# Patient Record
Sex: Female | Born: 1975 | Race: White | Hispanic: No | Marital: Single | Smoking: Never smoker
Health system: Southern US, Community
[De-identification: ages and names within clinical notes are randomized; demographics above are authoritative.]

## PROBLEM LIST (undated history)

## (undated) DIAGNOSIS — F4310 Post-traumatic stress disorder, unspecified: Secondary | ICD-10-CM

## (undated) DIAGNOSIS — G43909 Migraine, unspecified, not intractable, without status migrainosus: Secondary | ICD-10-CM

## (undated) DIAGNOSIS — F419 Anxiety disorder, unspecified: Secondary | ICD-10-CM

## (undated) DIAGNOSIS — J302 Other seasonal allergic rhinitis: Secondary | ICD-10-CM

## (undated) DIAGNOSIS — G4733 Obstructive sleep apnea (adult) (pediatric): Secondary | ICD-10-CM

## (undated) DIAGNOSIS — R011 Cardiac murmur, unspecified: Secondary | ICD-10-CM

## (undated) DIAGNOSIS — G4730 Sleep apnea, unspecified: Secondary | ICD-10-CM

## (undated) DIAGNOSIS — K219 Gastro-esophageal reflux disease without esophagitis: Secondary | ICD-10-CM

## (undated) DIAGNOSIS — H32 Chorioretinal disorders in diseases classified elsewhere: Secondary | ICD-10-CM

## (undated) DIAGNOSIS — N946 Dysmenorrhea, unspecified: Secondary | ICD-10-CM

## (undated) DIAGNOSIS — K589 Irritable bowel syndrome without diarrhea: Secondary | ICD-10-CM

## (undated) DIAGNOSIS — F329 Major depressive disorder, single episode, unspecified: Secondary | ICD-10-CM

## (undated) DIAGNOSIS — T7840XA Allergy, unspecified, initial encounter: Secondary | ICD-10-CM

## (undated) DIAGNOSIS — D649 Anemia, unspecified: Secondary | ICD-10-CM

## (undated) DIAGNOSIS — B399 Histoplasmosis, unspecified: Secondary | ICD-10-CM

## (undated) DIAGNOSIS — F32A Depression, unspecified: Secondary | ICD-10-CM

## (undated) DIAGNOSIS — Z8619 Personal history of other infectious and parasitic diseases: Secondary | ICD-10-CM

## (undated) DIAGNOSIS — K5909 Other constipation: Secondary | ICD-10-CM

## (undated) HISTORY — DX: Irritable bowel syndrome, unspecified: K58.9

## (undated) HISTORY — DX: Migraine, unspecified, not intractable, without status migrainosus: G43.909

## (undated) HISTORY — DX: Depression, unspecified: F32.A

## (undated) HISTORY — DX: Anxiety disorder, unspecified: F41.9

## (undated) HISTORY — DX: Other constipation: K59.09

## (undated) HISTORY — DX: Allergy, unspecified, initial encounter: T78.40XA

## (undated) HISTORY — DX: Personal history of other infectious and parasitic diseases: Z86.19

## (undated) HISTORY — DX: Major depressive disorder, single episode, unspecified: F32.9

## (undated) HISTORY — DX: Histoplasmosis, unspecified: H32

## (undated) HISTORY — DX: Histoplasmosis, unspecified: B39.9

## (undated) HISTORY — DX: Gastro-esophageal reflux disease without esophagitis: K21.9

## (undated) HISTORY — DX: Cardiac murmur, unspecified: R01.1

## (undated) HISTORY — PX: WISDOM TOOTH EXTRACTION: SHX21

## (undated) HISTORY — DX: Obstructive sleep apnea (adult) (pediatric): G47.33

## (undated) HISTORY — PX: COLONOSCOPY: SHX174

## (undated) HISTORY — DX: Anemia, unspecified: D64.9

## (undated) HISTORY — DX: Other seasonal allergic rhinitis: J30.2

## (undated) HISTORY — DX: Dysmenorrhea, unspecified: N94.6

## (undated) HISTORY — DX: Sleep apnea, unspecified: G47.30

## (undated) HISTORY — DX: Post-traumatic stress disorder, unspecified: F43.10

---

## 2007-10-20 ENCOUNTER — Ambulatory Visit: Payer: Self-pay | Admitting: Family Medicine

## 2007-10-20 DIAGNOSIS — F4310 Post-traumatic stress disorder, unspecified: Secondary | ICD-10-CM | POA: Insufficient documentation

## 2007-10-20 DIAGNOSIS — N6019 Diffuse cystic mastopathy of unspecified breast: Secondary | ICD-10-CM | POA: Insufficient documentation

## 2007-10-20 DIAGNOSIS — N946 Dysmenorrhea, unspecified: Secondary | ICD-10-CM | POA: Insufficient documentation

## 2007-11-28 ENCOUNTER — Ambulatory Visit: Payer: Self-pay | Admitting: Family Medicine

## 2008-04-02 ENCOUNTER — Ambulatory Visit: Payer: Self-pay | Admitting: Family Medicine

## 2008-04-09 ENCOUNTER — Ambulatory Visit: Payer: Self-pay | Admitting: Family Medicine

## 2008-04-09 LAB — CONVERTED CEMR LAB
AST: 30 units/L (ref 0–37)
Alkaline Phosphatase: 33 units/L — ABNORMAL LOW (ref 39–117)
Basophils Absolute: 0 10*3/uL (ref 0.0–0.1)
Bilirubin, Direct: 0.1 mg/dL (ref 0.0–0.3)
Chloride: 106 meq/L (ref 96–112)
Eosinophils Absolute: 0 10*3/uL (ref 0.0–0.7)
Eosinophils Relative: 1.4 % (ref 0.0–5.0)
GFR calc non Af Amer: 68 mL/min
HDL: 73.4 mg/dL (ref >39.0)
MCV: 86.8 fL (ref 78.0–100.0)
Neutrophils Relative %: 44.7 % (ref 43.0–77.0)
Platelets: 175 10*3/uL (ref 150–400)
Potassium: 4.5 meq/L (ref 3.5–5.1)
Sodium: 140 meq/L (ref 135–145)
Total Bilirubin: 0.6 mg/dL (ref 0.3–1.2)
VLDL: 12 mg/dL (ref 0–40)
WBC: 2.6 10*3/uL — ABNORMAL LOW (ref 4.5–10.5)

## 2008-04-10 LAB — CONVERTED CEMR LAB
Basophils Absolute: 0 10*3/uL (ref 0.0–0.1)
Basophils Relative: 0.6 % (ref 0.0–1.0)
Eosinophils Absolute: 0 10*3/uL (ref 0.0–0.7)
Eosinophils Relative: 1.1 % (ref 0.0–5.0)
Hemoglobin: 11.1 g/dL — ABNORMAL LOW (ref 12.0–15.0)
MCHC: 33.6 g/dL (ref 30.0–36.0)
MCV: 87.1 fL (ref 78.0–100.0)
Neutro Abs: 2.2 10*3/uL (ref 1.4–7.7)
RBC: 3.79 M/uL — ABNORMAL LOW (ref 3.87–5.11)

## 2008-05-11 ENCOUNTER — Ambulatory Visit: Payer: Self-pay | Admitting: Family Medicine

## 2008-05-11 DIAGNOSIS — D649 Anemia, unspecified: Secondary | ICD-10-CM | POA: Insufficient documentation

## 2008-05-15 LAB — CONVERTED CEMR LAB
Basophils Absolute: 0 10*3/uL (ref 0.0–0.1)
HCT: 35.5 % — ABNORMAL LOW (ref 36.0–46.0)
Lymphocytes Relative: 36.3 % (ref 12.0–46.0)
MCHC: 34.5 g/dL (ref 30.0–36.0)
Monocytes Absolute: 0.3 10*3/uL (ref 0.1–1.0)
Monocytes Relative: 7 % (ref 3.0–12.0)
Platelets: 169 10*3/uL (ref 150–400)
RDW: 12.9 % (ref 11.5–14.6)

## 2008-08-14 ENCOUNTER — Ambulatory Visit: Payer: Self-pay | Admitting: Family Medicine

## 2008-08-16 LAB — CONVERTED CEMR LAB
Basophils Relative: 0.1 % (ref 0.0–3.0)
Eosinophils Absolute: 0.1 10*3/uL (ref 0.0–0.7)
Eosinophils Relative: 1.4 % (ref 0.0–5.0)
MCV: 87.3 fL (ref 78.0–100.0)
Monocytes Relative: 6.1 % (ref 3.0–12.0)
Neutrophils Relative %: 61.3 % (ref 43.0–77.0)
RBC: 4.24 M/uL (ref 3.87–5.11)
WBC: 5.4 10*3/uL (ref 4.5–10.5)

## 2008-10-04 ENCOUNTER — Ambulatory Visit: Payer: Self-pay | Admitting: Family Medicine

## 2008-10-08 LAB — CONVERTED CEMR LAB
ALT: 28 units/L (ref 0–35)
Amylase: 102 units/L (ref 27–131)
Basophils Absolute: 0 10*3/uL (ref 0.0–0.1)
Basophils Relative: 0 % (ref 0.0–3.0)
Bilirubin, Direct: 0.1 mg/dL (ref 0.0–0.3)
Calcium: 8.4 mg/dL (ref 8.4–10.5)
Creatinine, Ser: 0.9 mg/dL (ref 0.4–1.2)
Eosinophils Absolute: 0 10*3/uL (ref 0.0–0.7)
GFR calc Af Amer: 93 mL/min
HCT: 39.1 % (ref 36.0–46.0)
Hemoglobin: 13.5 g/dL (ref 12.0–15.0)
Lipase: 25 units/L (ref 11.0–59.0)
MCHC: 34.6 g/dL (ref 30.0–36.0)
MCV: 89.7 fL (ref 78.0–100.0)
Monocytes Absolute: 0.2 10*3/uL (ref 0.1–1.0)
Neutro Abs: 5.5 10*3/uL (ref 1.4–7.7)
RBC: 4.36 M/uL (ref 3.87–5.11)
RDW: 12.5 % (ref 11.5–14.6)
Sodium: 136 meq/L (ref 135–145)
Total Bilirubin: 0.8 mg/dL (ref 0.3–1.2)

## 2008-10-11 ENCOUNTER — Ambulatory Visit: Payer: Self-pay | Admitting: Family Medicine

## 2008-10-11 LAB — CONVERTED CEMR LAB
CO2: 29 meq/L (ref 19–32)
Calcium: 8.9 mg/dL (ref 8.4–10.5)
Chloride: 103 meq/L (ref 96–112)
Creatinine, Ser: 0.9 mg/dL (ref 0.4–1.2)
GFR calc non Af Amer: 77 mL/min
Sodium: 137 meq/L (ref 135–145)

## 2008-10-31 ENCOUNTER — Ambulatory Visit: Payer: Self-pay | Admitting: Family Medicine

## 2008-11-12 ENCOUNTER — Encounter: Payer: Self-pay | Admitting: Family Medicine

## 2009-05-09 ENCOUNTER — Encounter: Payer: Self-pay | Admitting: Family Medicine

## 2009-05-14 ENCOUNTER — Encounter: Payer: Self-pay | Admitting: Family Medicine

## 2009-05-20 ENCOUNTER — Ambulatory Visit: Payer: Self-pay | Admitting: Family Medicine

## 2009-05-21 LAB — CONVERTED CEMR LAB
ALT: 14 units/L (ref 0–35)
AST: 19 units/L (ref 0–37)
Alkaline Phosphatase: 45 units/L (ref 39–117)
BUN: 10 mg/dL (ref 6–23)
Basophils Absolute: 0 10*3/uL (ref 0.0–0.1)
CO2: 28 meq/L (ref 19–32)
Calcium: 8.9 mg/dL (ref 8.4–10.5)
Creatinine, Ser: 0.8 mg/dL (ref 0.4–1.2)
Eosinophils Relative: 1.9 % (ref 0.0–5.0)
GFR calc non Af Amer: 87.41 mL/min (ref >60)
Glucose, Bld: 93 mg/dL (ref 70–99)
HCT: 36.4 % (ref 36.0–46.0)
Hemoglobin: 12.8 g/dL (ref 12.0–15.0)
Lymphocytes Relative: 21.4 % (ref 12.0–46.0)
Monocytes Relative: 9.3 % (ref 3.0–12.0)
Platelets: 161 10*3/uL (ref 150.0–400.0)
RDW: 12.3 % (ref 11.5–14.6)
TSH: 2.57 microintl units/mL (ref 0.35–5.50)
Total Bilirubin: 0.5 mg/dL (ref 0.3–1.2)
Total CHOL/HDL Ratio: 2
WBC: 5.1 10*3/uL (ref 4.5–10.5)

## 2009-05-22 ENCOUNTER — Ambulatory Visit: Payer: Self-pay | Admitting: Family Medicine

## 2009-05-22 ENCOUNTER — Other Ambulatory Visit: Admission: RE | Admit: 2009-05-22 | Discharge: 2009-05-22 | Payer: Self-pay | Admitting: Family Medicine

## 2009-05-22 ENCOUNTER — Encounter: Payer: Self-pay | Admitting: Family Medicine

## 2009-05-22 DIAGNOSIS — D485 Neoplasm of uncertain behavior of skin: Secondary | ICD-10-CM

## 2009-05-31 ENCOUNTER — Encounter (INDEPENDENT_AMBULATORY_CARE_PROVIDER_SITE_OTHER): Payer: Self-pay | Admitting: *Deleted

## 2009-06-11 ENCOUNTER — Ambulatory Visit: Payer: Self-pay | Admitting: Family Medicine

## 2009-06-11 DIAGNOSIS — M79609 Pain in unspecified limb: Secondary | ICD-10-CM

## 2009-06-13 ENCOUNTER — Encounter (INDEPENDENT_AMBULATORY_CARE_PROVIDER_SITE_OTHER): Payer: Self-pay | Admitting: *Deleted

## 2009-06-13 LAB — CONVERTED CEMR LAB
Anti Nuclear Antibody(ANA): NEGATIVE
Rhuematoid fact SerPl-aCnc: <20 intl units/mL (ref 0.0–20.0)
Sed Rate: 11 mm/hr (ref 0–22)

## 2009-06-14 ENCOUNTER — Encounter: Payer: Self-pay | Admitting: Family Medicine

## 2009-07-04 ENCOUNTER — Encounter: Payer: Self-pay | Admitting: Family Medicine

## 2009-11-14 ENCOUNTER — Telehealth: Payer: Self-pay | Admitting: Family Medicine

## 2009-12-17 ENCOUNTER — Ambulatory Visit: Payer: Self-pay | Admitting: Family Medicine

## 2009-12-17 DIAGNOSIS — J309 Allergic rhinitis, unspecified: Secondary | ICD-10-CM | POA: Insufficient documentation

## 2009-12-19 ENCOUNTER — Telehealth: Payer: Self-pay | Admitting: Family Medicine

## 2010-01-02 ENCOUNTER — Encounter: Payer: Self-pay | Admitting: Family Medicine

## 2010-06-24 ENCOUNTER — Encounter (INDEPENDENT_AMBULATORY_CARE_PROVIDER_SITE_OTHER): Payer: Self-pay | Admitting: *Deleted

## 2010-07-02 ENCOUNTER — Ambulatory Visit: Payer: Self-pay | Admitting: Family Medicine

## 2010-07-02 ENCOUNTER — Telehealth (INDEPENDENT_AMBULATORY_CARE_PROVIDER_SITE_OTHER): Payer: Self-pay | Admitting: *Deleted

## 2010-07-02 LAB — CONVERTED CEMR LAB
AST: 19 units/L (ref 0–37)
Albumin: 4 g/dL (ref 3.5–5.2)
Basophils Absolute: 0 10*3/uL (ref 0.0–0.1)
Basophils Relative: 0.4 % (ref 0.0–3.0)
CO2: 28 meq/L (ref 19–32)
Chloride: 107 meq/L (ref 96–112)
Eosinophils Absolute: 0.1 10*3/uL (ref 0.0–0.7)
Glucose, Bld: 88 mg/dL (ref 70–99)
HCT: 36.9 % (ref 36.0–46.0)
HDL: 50.9 mg/dL (ref >39.00)
Hemoglobin: 12.6 g/dL (ref 12.0–15.0)
Lymphs Abs: 1.6 10*3/uL (ref 0.7–4.0)
MCHC: 34.2 g/dL (ref 30.0–36.0)
Neutro Abs: 2.6 10*3/uL (ref 1.4–7.7)
Potassium: 4.2 meq/L (ref 3.5–5.1)
RBC: 4.18 M/uL (ref 3.87–5.11)
RDW: 13.3 % (ref 11.5–14.6)
Sodium: 140 meq/L (ref 135–145)
TSH: 1.92 microintl units/mL (ref 0.35–5.50)
Total CHOL/HDL Ratio: 3
Total Protein: 6.6 g/dL (ref 6.0–8.3)
Triglycerides: 73 mg/dL (ref 0.0–149.0)

## 2010-07-08 ENCOUNTER — Ambulatory Visit: Payer: Self-pay | Admitting: Family Medicine

## 2010-07-08 ENCOUNTER — Telehealth: Payer: Self-pay | Admitting: Family Medicine

## 2010-07-08 DIAGNOSIS — R0989 Other specified symptoms and signs involving the circulatory and respiratory systems: Secondary | ICD-10-CM

## 2010-07-08 DIAGNOSIS — R0609 Other forms of dyspnea: Secondary | ICD-10-CM

## 2010-07-24 ENCOUNTER — Ambulatory Visit: Payer: Self-pay | Admitting: Pulmonary Disease

## 2010-07-24 DIAGNOSIS — G2581 Restless legs syndrome: Secondary | ICD-10-CM

## 2010-07-24 DIAGNOSIS — F458 Other somatoform disorders: Secondary | ICD-10-CM

## 2010-08-20 ENCOUNTER — Ambulatory Visit (HOSPITAL_BASED_OUTPATIENT_CLINIC_OR_DEPARTMENT_OTHER)
Admission: RE | Admit: 2010-08-20 | Discharge: 2010-08-20 | Payer: Self-pay | Source: Home / Self Care | Admitting: Pulmonary Disease

## 2010-08-20 ENCOUNTER — Encounter: Payer: Self-pay | Admitting: Pulmonary Disease

## 2010-08-27 ENCOUNTER — Ambulatory Visit: Payer: Self-pay | Admitting: Pulmonary Disease

## 2010-09-05 ENCOUNTER — Ambulatory Visit: Payer: Self-pay | Admitting: Pulmonary Disease

## 2010-09-05 DIAGNOSIS — G4733 Obstructive sleep apnea (adult) (pediatric): Secondary | ICD-10-CM

## 2010-09-16 ENCOUNTER — Encounter: Payer: Self-pay | Admitting: Pulmonary Disease

## 2010-09-30 ENCOUNTER — Encounter: Payer: Self-pay | Admitting: Pulmonary Disease

## 2010-10-22 ENCOUNTER — Telehealth: Payer: Self-pay | Admitting: Family Medicine

## 2010-11-04 NOTE — Progress Notes (Signed)
Summary: wants referral for sleep study  Phone Note Call from Patient Call back at Home Phone [phone]   Caller: Patient Call For: Judith Part MD Summary of Call: Pt is asking if you will refer her for a sleep study.  Her psych has suggested she have this, and she says she has discussed this with you.  Psyche has been trying to refer her to [facility], but they are not following through.  She would prefer to go somewhere local. Initial call taken by: Lowella Petties CMA,  July 08, 2010 11:47 AM  Follow-up for Phone Call        I will do a ref and route to [facility]  Follow-up by: Judith Part MD,  July 08, 2010 1:18 PM  Additional Follow-up for Phone Call Additional follow up Details #1::        Left message for patient to call back. Lewanda Rife LPN  July 08, 2010 2:49 PM   Patient notified as instructed by telephone. Pt will wait to hear from pt care coordinator about appt. Pt said she would go to [facility] or [facility].Lewanda Rife LPN  July 08, 2010 4:21 PM     Additional Follow-up for Phone Call Additional follow up Details #2::    Appt made with Dr Craige Cotta on 07/24/2010 at 2:15pm. Follow-up by: Carlton Adam,  July 14, 2010 12:44 PM

## 2010-11-04 NOTE — Consult Note (Signed)
Summary: [facility]  [facility]   Imported By: Lanelle Bal 03/10/2010 10:14:48  _____________________________________________________________________  External Attachment:    Type:   Image     Comment:   External Document

## 2010-11-04 NOTE — Miscellaneous (Signed)
Summary: Polysomnogram report  Clinical Lists Changes AHI 2.1, RDI 18.7, SpO2 low 90%, PLMI 2.6.  Mild/moderate obstructive sleep apnea.  Will have my nurse schedule next available ROV to discuss.  Appended Document: Polysomnogram report LMOMTCB.  Appended Document: Polysomnogram report Pt has sch a f/u with VS on 09/05/2010 @ 3:45pm to discuss sleep results.

## 2010-11-04 NOTE — Assessment & Plan Note (Signed)
Summary: ? ALLERGIES   Vital Signs:  Patient profile:   35 year old female Height:      63.5 inches Weight:      171.75 pounds BMI:     30.06 Temp:     99.2 degrees F oral Pulse rate:   80 / minute Pulse rhythm:   regular BP sitting:   100 / 70  (left arm) Cuff size:   regular  Vitals Entered By: Lewanda Rife LPN (December 17, 2009 12:03 PM)  History of Present Illness: this is recurrent problem-- spring and fall (worse in the spring with blooms)  strongly suspects pollen allergies  also works in [employer] building  a lot of post nasal drip and then her glands feel tender eyes burn and itch and water  then bad sinus headache and congestion some scratchy throat  some sneezing   has taken claritin- only occasionally works has tried [employer] before -- does not like it - feels irritating to her  does some saline irrigations   allegra made her sleepy -- 180   never tried zyrtec   no fever  little cough   mother - takes allergy shots and so does her dad     Allergies (verified): No Known Drug Allergies  Past History:  Past Surgical History: Last updated: 10/20/2007 wisdom teeth revoved  Family History: Last updated: 04/09/2008 father HTN , allergies mother digestive problems (w/u at [facility])-? 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 Eyes:  Complains of discharge and eye irritation. ENT:  Complains of nasal congestion, postnasal drainage, and sinus pressure; denies earache and hoarseness. CV:  Denies chest pain or discomfort and palpitations. Resp:  Denies cough, shortness of breath, and wheezing. GI:  Denies indigestion, nausea, and vomiting. Derm:  Denies itching and rash. Allergy:  Complains of itching eyes, seasonal allergies, and sneezing; denies hives or rash.  Physical Exam  General:  Well-developed,well-nourished,in no acute distress; alert,appropriate and cooperative throughout examination Head:  normocephalic, atraumatic, and no abnormalities observed.  no sinus tenderness Eyes:  vision grossly intact, pupils equal, pupils round, pupils reactive to light, and no injection.   Ears:  R ear normal and L ear normal.   Nose:  nares are boggy and injected  Mouth:  pharynx pink and moist, no erythema, and no exudates.   some clear post nasal drip  Neck:  supple with full rom and no masses or thyromegally, no JVD or carotid bruit  Lungs:  Normal respiratory effort, chest expands symmetrically. Lungs  are clear to auscultation, no crackles or wheezes. no wheeze even on forced exp Heart:  Normal rate and regular rhythm. S1 and S2 normal without gallop, murmur, click, rub or other extra sounds. Skin:  Intact without suspicious lesions or rashes Cervical Nodes:  No lymphadenopathy noted Psych:  normal affect, talkative and pleasant    Impression & Recommendations:  Problem # 1:  ALLERGIC RHINITIS (ICD-477.9) Assessment  New with seasonal worsening - suspect tree pollen allergy and dust  tx with zyrtec 10mg  at bedtime and trial of veramyst (did not tol flonase in past )- if not affordible consider nasonex  also ref to allergist -pt would like to be tested  The following medications were removed from the medication list:    Claritin 10 Mg Tabs (Loratadine) ..... Otc as directed. Her updated medication list for this problem includes:    Zyrtec Allergy 10 Mg Tabs (Cetirizine hcl) .Marland Kitchen... 1 by mouth once daily in evening    Veramyst 27.5 Mcg/spray Susp (Fluticasone furoate) .Marland Kitchen... 2 sprays per nostril once daily  Orders: Allergy Referral  (Allergy) Prescription Created Electronically [phone])  Complete Medication List: 1)  Clonazepam 0.5 Mg Tabs (Clonazepam) .... Take 2 by mouth at night 2)  Wellbutrin Xl 300 Mg Xr24h-tab (Bupropion hcl) .... Take 1 tablet by mouth once a day 3)  Celexa 40 Mg Tabs (Citalopram hydrobromide) .... Take 1  by mouth daily 4)  Miralax Powd (Polyethylene glycol 3350) .... Take 2 capfuls by mouth daily 5)  Multivitamins Tabs (Multiple vitamin) .... Take one by mouth once a day 6)  Vitamin C 500 Mg  .... Take 2 by mouth once a day 7)  Calcium With Vitamin D  .... Take one by mouth daily 8)  Ambien 10 Mg Tabs (Zolpidem tartrate) .... One tab daily as needed insomnia 9)  Meclizine Hcl 25 Mg Tabs (Meclizine hcl) .Marland Kitchen.. 1 by mouth up to three times a day as needed dizziness 10)  Zyrtec Allergy 10 Mg Tabs (Cetirizine hcl) .Marland Kitchen.. 1 by mouth once daily in evening 11)  Veramyst 27.5 Mcg/spray Susp (Fluticasone furoate) .... 2 sprays per nostril once daily  Patient Instructions: 1)  get zyrtec 10 mg 1 pill daily in the evening or bedtime  2)  try veramyst once daily - here is coupon (if that is not affordible -- let me know and will try nasonex instead)  3)  we will do allergy referral at check out  Prescriptions: VERAMYST 27.5 MCG/SPRAY SUSP (FLUTICASONE FUROATE) 2 sprays per nostril once daily   #1 mdi x 11   Entered and Authorized by:   Judith Part MD   Signed by:   Judith Part MD on 12/17/2009   Method used:   Electronically to        [facility] [address] (retail)       [address]       [place], [place]  [address]       Ph: [phone]       Fax: [phone]   RxID:   [id number] ZYRTEC ALLERGY 10 MG TABS (CETIRIZINE HCL) 1 by mouth once daily in evening  #30 x 11   Entered and Authorized by:   Judith Part MD   Signed by:   Judith Part MD on 12/17/2009   Method used:   Print then Give to Patient   RxID:   [id number]   Current Allergies (reviewed today): No known allergies

## 2010-11-04 NOTE — Progress Notes (Signed)
Summary: Prior Authorization Veramyst  Phone Note Outgoing Call Call back at [phone]   Call placed by: Linde Gillis CMA Duncan Dull),  December 19, 2009 8:44 AM Call placed to: [employer] Summary of Call: I have called [employer] twice trying to get PA on Veramyst.  Called on 12/18/2009 and again today 12/19/2009. Was told both times that the member is not active or doesn't have an active account.  Called [employer] and was told that the member is active and to call [employer].  I left a message on cell phone voice mail of patient to have her call [employer] to see what's going on and call me back.  Linde Gillis CMA Duncan Dull)  December 19, 2009 8:51 AM   Spoke with patient and she says to just forget about trying to get PA on Veramyst.  She says that it is too much of a hassle and she will do without.   Initial call taken by: Linde Gillis CMA Duncan Dull),  December 25, 2009 9:46 AM  Follow-up for Phone Call        ok- I will ignore it  Follow-up by: Judith Part MD,  December 25, 2009 12:57 PM

## 2010-11-04 NOTE — Assessment & Plan Note (Signed)
Summary: f/u sleep test/LC   Visit Type:  Follow-up Copy to:  Dr. Roxy Manns, Dr. Milagros Evener Primary Provider/Referring Provider:  Dr. Roxy Manns  CC:  Sleep test follow-up....  History of Present Illness: 35 yo female with OSA.  She is here to f/u her sleep study.  From November 16.  This showed AHI 2.1, RDI 18.7, SpO2 low 90%, PLMI 2.6.  This is consistent with mild/moderate obstructive sleep apnea.  She continues to have same sleep difficulties as before.    Current Medications (verified): 1)  Clonazepam 0.5 Mg  Tabs (Clonazepam) .... Take 2 By Mouth At Night 2)  Wellbutrin Xl 300 Mg Xr24h-Tab (Bupropion Hcl) .... Take 1 Tablet By Mouth Once A Day 3)  Celexa 40 Mg Tabs (Citalopram Hydrobromide) .Marland Kitchen.. 1 By Mouth Daily 4)  Lunesta 3 Mg Tabs (Eszopiclone) .... Take 1 Tablet By Mouth Once A Day At Bedtime As Needed. 5)  Zyrtec Allergy 10 Mg Tabs (Cetirizine Hcl) .Marland Kitchen.. 1 By Mouth Once Daily in Evening As Needed 6)  Chewable Calcium/d 300-100 Mg-Unit Chew (Calcium-Vitamin D) .... 2 By Mouth Daily 7)  Ibuprofen 200 Mg Tabs (Ibuprofen) .... Otc As Directed. 8)  Miralax   Powd (Polyethylene Glycol 3350) .... Take 1 Capful  By Mouth Daily 9)  Multivitamins  Tabs (Multiple Vitamin) .Marland Kitchen.. 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 wisdom teeth removed  Social History: Reviewed history from 07/08/2010 and no changes required. 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 Comments Medications reviewed with patient Michel Bickers [facility]  September 05, 2010 4:01 PM   Physical Exam  General:  normal appearance and healthy appearing.   Nose:  no deformity, discharge, inflammation, or lesions Mouth:  High arched palate, MP 3, enlarged tongue, retrognathic Neck:  no JVD.   Lungs:  clear bilaterally to auscultation and percussion Heart:  regular rate and rhythm, S1, S2 without murmurs, rubs, gallops, or clicks Extremities:  no clubbing, cyanosis, edema, or deformity noted Neurologic:  normal CN II-XII, gait normal, and strength normal.   Cervical Nodes:  no significant adenopathy Psych:  alert and cooperative; normal mood and affect;  normal attention span and concentration   Impression & Recommendations:  Problem # 1:  OBSTRUCTIVE SLEEP APNEA (ICD-327.23) Reviewed her sleep study results.  Explained how sleep apnea can affect her health.  Driving precautions and need for weight loss were discussed.  Discussed treatment options.    She would be a good candidate for an oral appliance.  However, she does not think she could tolerate having something in her mouth.  She seemed to like the CPAP machine when it was fitted prior to her sleep study.  Will proceed with auto CPAP titration at home.  Depending on results she may need in lab  titration.  Will have her continue lunesta for now, and re-assess need for this after she is established on CPAP therapy.  Problem # 2:  BRUXISM (ICD-306.8) She has tried a mouth guard before, but had trouble tolerating this.  Will see if this improves with therapy for her sleep apnea.  Problem # 3:  RESTLESS LEG SYNDROME (ICD-333.94) Will reassess this after treatment of her sleep apnea.  Medications Added to Medication List This Visit: 1)  Celexa 40 Mg Tabs (Citalopram hydrobromide) .Marland Kitchen.. 1 by mouth daily 2)  Chewable Calcium/d 300-100 Mg-unit Chew (Calcium-vitamin d) .... 2 by mouth daily 3)  Multivitamins Tabs (Multiple vitamin) .Marland Kitchen.. 1 by mouth daily  Complete Medication List: 1)  Clonazepam 0.5 Mg Tabs (Clonazepam) .... Take 2 by mouth at night 2)  Wellbutrin Xl 300 Mg Xr24h-tab (Bupropion hcl) .... Take 1 tablet by mouth once a day 3)  Celexa 40 Mg Tabs (Citalopram hydrobromide) .Marland Kitchen.. 1 by mouth daily 4)  Lunesta 3 Mg Tabs (Eszopiclone) .... Take 1 tablet by mouth once a day at bedtime as needed. 5)  Zyrtec Allergy 10 Mg Tabs (Cetirizine hcl) .Marland Kitchen.. 1 by mouth once daily in evening as needed 6)  Chewable Calcium/d 300-100 Mg-unit Chew (Calcium-vitamin d) .... 2 by mouth daily 7)  Ibuprofen 200 Mg Tabs (Ibuprofen) .... Otc as directed. 8)  Miralax Powd (Polyethylene glycol 3350) .... Take 1 capful  by mouth daily 9)  Multivitamins Tabs (Multiple vitamin) .Marland Kitchen.. 1 by mouth daily  Other Orders: Est. Patient Level III (78295) DME Referral (DME)  Patient Instructions: 1)  Will set up CPAP at home 2)  Will call with report from CPAP machine 3)  Follow up in 2 months

## 2010-11-04 NOTE — Medication Information (Signed)
Summary: Polysomnogram/[facility]  Polysomnogram/[facility]   Imported By: Sherian Rein 09/10/2010 10:36:11  _____________________________________________________________________  External Attachment:    Type:   Image     Comment:   External Document

## 2010-11-04 NOTE — Progress Notes (Signed)
----   Converted from flag ---- ---- 07/01/2010 8:57 PM, Judith Part MD wrote: please check wellness and lipid for v70.0 thanks   ---- 07/01/2010 7:48 AM, Liane Comber CMA (AAMA) wrote: Lab orders please! Good Morning! This pt is scheduled for cpx labs tomorrow, which labs to draw and dx codes to use? Thanks Tasha ------------------------------

## 2010-11-04 NOTE — Assessment & Plan Note (Signed)
Summary: SNORING-OBSERVED SLEEP APNEA//kp   Visit Type:  Initial Consult Copy to:  Dr. Roxy Manns, Dr. Milagros Evener Primary Provider/Referring Provider:  Dr. Roxy Manns  CC:  Sleep Consult.  History of Present Illness: 35 yo female for sleep evaluation.  She has noticed trouble with her sleep for some time.  She feels tired all the time, and like she is in a fog.  She has noticed these problems her whole life.  She has never had a formal sleep evaluation or sleep test before.  She goes to bed at 9pm, and falls asleep quickly.  She has been using lunesta as needed.  She was using [place], but she would wake up and not remember doing things while using ambien.  She is also taking klonopin, but this is more for her anxiety rather than to help sleep.  She wakes up about 3am, and then sometimes has trouble falling back to sleep.  She gets out of bed at 6am, but still feels tired.  She does get occasional morning headache.  She does drink lots of caffienated soda during the day.    She tends to sleep on her side.  She lives alone, but family and friends have told her that she snores.  She has also been told that she stops breathing while asleep.  She has woken up hearing herself snore.  She tends to breath through her mouth at night.  She will tend to sleep in on weekends, but this does not seem to help.  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  She reports that the trauma occured during the night, and as a result she gets frequent nightmares.  She does talk in her sleep.  She was also told by her dentist that she grinds her teeth, and may need to get a mouth guard.  She denies sleep hallucinations, sleep paralysis, or cataplexy.  She does not drink much alcohol.  She will occasionally get funny feelings in her legs before bed, but this does not happen too often.  Her Epworth score is 13 out of 24.   Preventive  Screening-Counseling & Management  Alcohol-Tobacco     Smoking Status: never  Current Medications (verified): 1)  Clonazepam 0.5 Mg  Tabs (Clonazepam) .... Take 2 By Mouth At Night 2)  Wellbutrin Xl 300 Mg Xr24h-Tab (Bupropion Hcl) .... Take 1 Tablet By Mouth Once A Day 3)  Miralax   Powd (Polyethylene Glycol 3350) .... Take 1 Capful  By Mouth Daily 4)  Calcium With Vitamin D .... Take Two  By Mouth Daily 5)  Zyrtec Allergy 10 Mg Tabs (Cetirizine Hcl) .Marland Kitchen.. 1 By Mouth Once Daily in Evening As Needed 6)  Veramyst 27.5 Mcg/spray Susp (Fluticasone Furoate) .... 2 Sprays Per Nostril Once Daily As Needed 7)  Celexa 20 Mg Tabs (Citalopram Hydrobromide) .... Take 1 Tablet By Mouth Once A Day 8)  Lunesta 3 Mg Tabs (Eszopiclone) .... Take 1 Tablet By Mouth Once A Day At Bedtime As Needed. 9)  Ibuprofen 200 Mg Tabs (Ibuprofen) .... Otc As Directed.  Allergies (verified): No Known Drug Allergies  Past History:  Past Medical History: Reviewed history from 07/08/2010 and no changes required. 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 father HTN , allergies mother digestive problems (w/u at [facility] clinic)-? bact inf GM DM GF P cancer  some anx and depression diffusely in family ,  Social History: Reviewed history from 07/08/2010 and no changes required. 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  The patient denies shortness of breath with activity, shortness of breath at rest, productive cough, non-productive cough, coughing up blood, chest pain, irregular heartbeats, acid heartburn, indigestion, loss of appetite, weight change, abdominal pain, sore throat, sneezing, itching, ear ache, anxiety, depression, hand/feet swelling, joint stiffness or pain, rash, change in color of mucus, and fever.    Vital Signs:  Patient profile:   35 year old female Height:      63 inches Weight:      178.38 pounds O2 Sat:      97 % on Room air Temp:     98.3 degrees F oral Pulse rate:   73 / minute BP sitting:   110 / 70  (right arm) Cuff size:   regular  Vitals Entered By: Carron Curie CMA (July 24, 2010 2:30 PM)  O2 Flow:  Room air CC: Sleep Consult Comments Medications reviewed with patient Traniya Prichett CMA  July 24, 2010 2:31 PM Daytime phone number verified with patient.    Physical Exam  General:  normal appearance and healthy appearing.   Eyes:  PERRLA and EOMI, wears glasses Nose:  no deformity, discharge, inflammation, or lesions Mouth:  High arched palate, MP 3, enlarged tongue, retrognathic Neck:  no JVD.   Chest Wall:  no deformities noted Lungs:  clear bilaterally to auscultation and percussion Heart:  regular rate and rhythm, S1, S2 without murmurs, rubs, gallops, or clicks Abdomen:  bowel sounds positive; abdomen soft and non-tender without masses, or organomegaly Msk:  no deformity or scoliosis noted with normal posture Pulses:  pulses normal Extremities:  no clubbing, cyanosis, edema, or deformity noted Neurologic:  normal CN II-XII, gait normal, and strength normal.   Cervical Nodes:  no significant adenopathy Psych:  alert and cooperative; normal mood and affect; normal attention span and  concentration   Impression & Recommendations:  Problem # 1:  SNORING (ICD-786.09) She has symptoms and physical findings suggestive of sleep apnea.  To further assess this I will arrange for her to have a sleep study.  I have reviewed what sleep apnea is, and how this can affect her health.  Driving precautions were reviewed.  Also explained how this could contribute to worsening of her mood disorder if not adequately controlled.  Will allow her to continue with lunesta as needed for now, but will see if this can be weaned off based on results of her sleep test.  If she is found to have sleep apnea, she may be a candidate for an oral appliance.  Problem # 2:  RESTLESS LEG SYNDROME (ICD-333.94) This is mild.  Will monitor clinically, and re-assess after review of her sleep study.  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  I have advised her to defer getting fitted for a mouth guard until after her sleep study is completed.  If she has sleep apnea, then she may also be a candidate for a mandibular advancement device.  Complete Medication List: 1)  Clonazepam 0.5 Mg Tabs (Clonazepam) .... Take 2 by mouth at night 2)  Wellbutrin Xl 300 Mg Xr24h-tab (Bupropion hcl) .... Take 1 tablet by mouth once a day 3)  Celexa 20 Mg Tabs (Citalopram hydrobromide) .... Take 1 tablet by mouth once a day 4)  Lunesta 3 Mg Tabs (Eszopiclone) .... Take 1 tablet by mouth once a day at bedtime as needed. 5)  Zyrtec Allergy 10 Mg Tabs (Cetirizine hcl) .Marland Kitchen.. 1 by mouth once daily in evening as needed 6)  Veramyst 27.5 Mcg/spray Susp (Fluticasone furoate) .... 2 sprays per nostril once daily as needed 7)  Calcium With Vitamin D  .... Take two  by mouth daily 8)  Ibuprofen 200 Mg Tabs (Ibuprofen) .... Otc as directed. 9)  Miralax Powd (Polyethylene glycol 3350) .... Take 1 capful  by mouth daily  Other Orders: Consultation Level IV (38101) Sleep Disorder Referral  (Sleep Disorder)  Patient Instructions: 1)  Will schedule sleep test 2)  Will call to schedule follow up after sleep test is scheduled

## 2010-11-04 NOTE — Miscellaneous (Signed)
Summary: flu shot at [facility]  Clinical Lists Changes  Observations: Added new observation of FLU VAX: Historical (06/16/2010 10:16)      Immunization History:  Influenza Immunization History:    Influenza:  historical (06/16/2010)  Pt received flu vaccine at [facility] on [address], [place].           Lowella Petties CMA  June 24, 2010 10:17 AM

## 2010-11-04 NOTE — Assessment & Plan Note (Signed)
Summary: CPX/CLE   Vital Signs:  Patient profile:   35 year old female Height:      62.75 inches Weight:      172.25 pounds BMI:     30.87 Temp:     98.5 degrees F oral Pulse rate:   80 / minute Pulse rhythm:   regular BP sitting:   100 / 70  (left arm) Cuff size:   regular  Vitals Entered By: Lewanda Rife LPN (July 08, 2010 10:43 AM) CC: CPX LMP 3 wks ago   History of Present Illness: here for health mt exam   is feeling ok overall   has had a stomach bug (going around school) -- had 24 hour bug a week ago  (vomiting ) still getting a little nausea  no chance pregnant absolutely   wt is up 1 lb   bp 100/70  labs all look good great chol Last Lipid ProfileCholesterol: 144 (07/02/2010 8:12:42 AM)HDL:  50.90 (07/02/2010 8:12:42 AM)LDL:  79 (07/02/2010 8:12:42 AM)Triglycerides:  Last Liver profileSGOT:  19 (07/02/2010 8:12:42 AM)SPGT:  14 (07/02/2010 8:12:42 AM)T. Bili:  0.5 (07/02/2010 8:12:42 AM)Alk Phos:  55 (07/02/2010 8:12:42 AM)   pap nl 8/10 no gyn symptoms or abn paps last 3 paps all normal  not sexually active  menses-- regular / and heavy and painful at times  stopped OC a year ago -- is happier off of them   Td 07  flu shot -- got at [facility]   seeing psychiatrist who specializes in trauma tx [facility] [place] ) -- it is really helping  was alble to decrease her celexa to 20 mg  things are generally up and down   had some concerns about sleep apnea -- snores loudly and sometimes has witnessed apnea  this was noticed on a trip  has never felt well rested  has not fallen asleep at the wheel  no MI or CVA in her past    Allergies (verified): No Known Drug Allergies  Past History:  Past Surgical History: Last updated: 10/20/2007 wisdom teeth revoved  Family History: Last updated: 04/09/2008 father HTN , allergies mother digestive problems (w/u at [facility])-? 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 Eyes:  Denies blurring and eye pain. CV:  Denies chest pain or discomfort and palpitations. Resp:  Denies cough and wheezing. GI:  Denies abdominal pain, change in bowel habits, indigestion, and nausea. Derm:  Denies itching, lesion(s), poor wound healing, and rash. Neuro:  Complains of headaches; denies  numbness, seizures, and tingling. Psych:  Complains of anxiety and depression. Endo:  Denies cold intolerance, excessive thirst, excessive urination, and heat intolerance. Heme:  Denies abnormal bruising and bleeding.  Physical Exam  General:  overweight but generally well appearing  Head:  normocephalic, atraumatic, and no abnormalities observed.   Eyes:  vision grossly intact, pupils equal, pupils round, and pupils reactive to light.  no conjunctival pallor, injection or icterus  Ears:  R ear normal and L ear normal.   Nose:  nares are injected and congested bilaterally  Mouth:   pharynx pink and moist, no erythema, and no exudates.   Neck:  supple with full rom and no masses or thyromegally, no JVD or carotid bruit  Chest Wall:  No deformities, masses, or tenderness noted. Breasts:  No mass, nodules, thickening, tenderness, bulging, retraction, inflamation, nipple discharge or skin changes noted.   Lungs:  Normal respiratory effort, chest expands symmetrically. Lungs are clear to auscultation, no crackles or wheezes. Heart:  Normal rate and regular rhythm. S1 and S2 normal without gallop, murmur, click, rub or other extra sounds. Abdomen:  Bowel sounds positive,abdomen soft and non-tender without masses, organomegaly or hernias noted. no renal bruits  Msk:  No deformity or scoliosis noted of thoracic or lumbar spine.  no acute joint changes  Pulses:  R and L carotid,radial,femoral,dorsalis pedis and posterior tibial pulses are full and equal bilaterally Extremities:  No clubbing, cyanosis, edema, or deformity noted with normal full range of motion of all joints.   Neurologic:  sensation intact to light touch, gait normal, and DTRs symmetrical and normal.   Skin:  Intact without suspicious lesions or rashes Cervical Nodes:  No lymphadenopathy noted Axillary Nodes:  No palpable lymphadenopathy Inguinal Nodes:  No significant adenopathy Psych:  normal affect, talkative and pleasant    Impression & Recommendations:  Problem # 1:  HEALTH MAINTENANCE EXAM (ICD-V70.0) Assessment Comment Only reviewed health habits including diet, exercise and skin cancer prevention reviewed health maintenance list and family history  rev labs in detail up to date imms   Problem # 2:  DYSMENORRHEA (ICD-625.3) Assessment: Unchanged is dealing with this with ibuprofen as needed and no no longer on OC will update me if this worsens  Problem # 3:  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 Take 2 by mouth at night 2)  Wellbutrin Xl 300 Mg Xr24h-tab (Bupropion hcl) .... Take 1 tablet by mouth once a day 3)  Miralax Powd (Polyethylene glycol 3350) .... Take 1 capful  by mouth daily 4)  Calcium With Vitamin D  .... Take two  by mouth daily 5)  Zyrtec Allergy 10 Mg Tabs (Cetirizine hcl) .Marland Kitchen.. 1 by mouth once daily in evening as needed 6)  Veramyst 27.5 Mcg/spray Susp (Fluticasone furoate) .... 2 sprays per nostril once daily as needed 7)  Celexa 20 Mg Tabs (Citalopram hydrobromide) .... Take 1 tablet by mouth once a day 8)  Lunesta 3 Mg Tabs (Eszopiclone) .... Take 1 tablet by mouth once a day at bedtime as needed. 9)  Ibuprofen  200 Mg Tabs (Ibuprofen) .... Otc as directed.  Patient Instructions: 1)  labs look great  2)  try to work on healthy diet and exercise  3)  no change in medicines   Current Allergies (reviewed today): No known allergies

## 2010-11-04 NOTE — Progress Notes (Signed)
Summary: pt not feeling well  Phone Note Call from Patient Call back at [phone]   Caller: Patient Call For: Judith Part MD Summary of Call: Pt states she has felt bad for 3 days, started with a headache, now feels fatigued.  Co- worker has been dx'd with the flu and pt asks if she should be tested, she had a flu shot.  Advised pt she probably has a virus other than the flu.  Advised rest, tylenol, fluids through the week end and call back next week or go to urgent care if worse.  Pt agreed. Initial call taken by: Lowella Petties CMA,  November 14, 2009 11:11 AM  Follow-up for Phone Call        if she has been sick for 3 days- too late for tamiflu anyway  usually flu comes with high fever 102 and above with severe body aches and chills making it hard to get out of bed  I would tx with analgesics otc / rest /fluids if symptoms worsen or not improved in several days - would follow up  Follow-up by: Judith Part MD,  November 14, 2009 11:49 AM  Additional Follow-up for Phone Call Additional follow up Details #1::        Patient notified as instructed by telephone. Lewanda Rife LPN  November 14, 2009 11:59 AM

## 2010-11-06 NOTE — Miscellaneous (Signed)
Summary: autoCPAP 09/15/10 to 09/30/10  Clinical Lists Changes Used on 16 of 16 nights with average 9hrs .  Optimal pressure 13 cm H2O, but AHI still 10 (mostly hypopneas).  Minimal airleak.  Will have pressure set at 13 cm H2O.  Plan d/w patient over the phone. Orders: Added new Referral order of DME Referral (DME) - Signed

## 2010-11-06 NOTE — Progress Notes (Signed)
Summary: refill request for meclizine  Phone Note Refill Request Call back at Home Phone [phone] Message from:  Patient  Refills Requested: Medication #1:  meclizine 25 Pt has been on this in the past for vertigo and she says she is getting the sxs of vertigo again- dizzy, nausea, spinning.  She is asking for a refill to be called to [facility] in [place].  She says it has been a couple of years since she has had this.  I told her since it has been last long she may need to be seen first.  Initial call taken by: Lowella Petties CMA, AAMA,  October 22, 2010 3:43 PM  Follow-up for Phone Call        is ok to fill- she does get this occasionally f/u if not improving in several days px written on EMR for call in  Follow-up by: Judith Part MD,  October 22, 2010 5:11 PM  Additional Follow-up for Phone Call Additional follow up Details #1::        Rx sent in as directed. Left message notifying patient and instructed her to follow up if no improvement and to call with any questions.  Additional Follow-up by: Janee Morn CMA Duncan Dull),  October 22, 2010 5:16 PM    New/Updated Medications: MECLIZINE HCL 25 MG TABS (MECLIZINE HCL) 1 by mouth up to three times a day as needed dizziness watch for sedation Prescriptions: MECLIZINE HCL 25 MG TABS (MECLIZINE HCL) 1 by mouth up to three times a day as needed dizziness watch for sedation  #20 x 0   Entered by:   Janee Morn CMA (AAMA)   Authorized by:   Judith Part MD   Signed by:   Selena Batten Dance CMA (AAMA) on 10/22/2010   Method used:   Electronically to        [facility] [address] [address] (retail)       [address]       [place], [place]  [address]       Ph: [phone]       Fax: [phone]   RxID:   [id number]

## 2010-11-17 ENCOUNTER — Ambulatory Visit (INDEPENDENT_AMBULATORY_CARE_PROVIDER_SITE_OTHER): Payer: BC Managed Care – PPO | Admitting: Pulmonary Disease

## 2010-11-17 ENCOUNTER — Encounter: Payer: Self-pay | Admitting: Pulmonary Disease

## 2010-11-17 DIAGNOSIS — G4733 Obstructive sleep apnea (adult) (pediatric): Secondary | ICD-10-CM

## 2010-11-24 ENCOUNTER — Encounter: Payer: Self-pay | Admitting: Family Medicine

## 2010-11-24 ENCOUNTER — Ambulatory Visit (INDEPENDENT_AMBULATORY_CARE_PROVIDER_SITE_OTHER): Payer: BC Managed Care – PPO | Admitting: Family Medicine

## 2010-11-24 DIAGNOSIS — R509 Fever, unspecified: Secondary | ICD-10-CM

## 2010-11-24 DIAGNOSIS — R05 Cough: Secondary | ICD-10-CM

## 2010-11-26 NOTE — Assessment & Plan Note (Signed)
Summary: rov/sh   Copy to:  Dr. Roxy Manns, Dr. Milagros Evener Primary Provider/Referring Provider:  Dr. Roxy Manns  CC:  follow up. Pt states she wears her cpap everynight x 6-8 hrs a night. Pt states she has been having problems with her mask leaking and feels her mask be small. Pt states she can tell a difference in how rested she feels since using her cpap.  History of Present Illness: 35 yo female with OSA.  She has been sleeping better since being on CPAP.  She is sleeping longer, and feels more rested.  She has more energy during the day.  She gets some leak from her mask, but otherwise no problems.  She has not noticed as much trouble with teeth grinding, and her leg symptoms haven't occured as much.  She continues to use ambien on most nights.  Current Medications (verified): 1)  Clonazepam 0.5 Mg  Tabs (Clonazepam) .... Take 2 By Mouth At Night 2)  Wellbutrin Xl 300 Mg Xr24h-Tab (Bupropion Hcl) .... Take 1 Tablet By Mouth Once A Day 3)  Celexa 40 Mg Tabs (Citalopram Hydrobromide) .Marland Kitchen.. 1 By Mouth Daily 4)  Zyrtec Allergy 10 Mg Tabs (Cetirizine Hcl) .Marland Kitchen.. 1 By Mouth Once Daily in Evening As Needed 5)  Chewable Calcium/d 300-100 Mg-Unit Chew (Calcium-Vitamin D) .... 2 By Mouth Daily 6)  Ibuprofen 200 Mg Tabs (Ibuprofen) .... Otc As Directed. 7)  Miralax   Powd (Polyethylene Glycol 3350) .... Take 1 Capful  By Mouth Daily 8)  Multivitamins  Tabs (Multiple Vitamin) .Marland Kitchen.. 1 By Mouth Daily 9)  Meclizine Hcl 25 Mg Tabs (Meclizine Hcl) .Marland Kitchen.. 1 By Mouth Up To Three Times A Day As Needed Dizziness Watch For Sedation 10)  Ambien 10 Mg Tabs (Zolpidem Tartrate) .... Once Daily At Night As Needed 11)  L-Lysine 1000 Mg Tabs (Lysine) .... 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[place]) heart M - neg echo in past stress induced urticaria had chicken pox  some anemia in past (is a blood donor) allergic rhinitis seasonal - vasomotor rhinitis  dysmenorrohea  Obstructive sleep apnea      - PSG 08/20/10 RDI 18.7  Past Surgical History: Last updated: 07/24/2010 wisdom teeth removed  Vital Signs:  Patient profile:   35 year old female Height:      63 inches Weight:      178.25 pounds BMI:     31.69 O2 Sat:      97 % on Room air Temp:     98.2 degrees F oral Pulse rate:   106 / minute BP sitting:   114 / 72  (left arm) Cuff size:   regular  Vitals Entered By: Carver Fila (November 17, 2010 4:33 PM)  O2 Flow:  Room air CC: follow up. Pt states she wears her cpap everynight x 6-8 hrs a night. Pt states she has been having problems with her mask leaking and feels her mask be small. Pt states she can tell a difference in how rested she feels since using her cpap Comments meds and allergies updated Phone number updated Carver Fila  November 17, 2010 4:34 PM     Impression & Recommendations:  Problem # 1:  OBSTRUCTIVE SLEEP APNEA (ICD-327.23) Improved.  Will have her DME assess her  mask fit.  Problem # 2:  BRUXISM (ICD-306.8) Improved.  Problem # 3:  RESTLESS LEG SYNDROME (ICD-333.94) Improved.  Problem # 4:  PTSD (ICD-309.81) She uses ambien to help with sleep initiation.  She will try to gradually taper her dose as tolerated.  Medications Added to Medication List This Visit: 1)  Ambien 10 Mg Tabs (Zolpidem tartrate) .... Once daily at night as needed 2)  L-lysine 1000 Mg Tabs (Lysine) .... 3 -4 times per week  Complete Medication List: 1)  Clonazepam 0.5 Mg Tabs (Clonazepam) .... Take 2 by mouth at night 2)  Wellbutrin Xl 300 Mg Xr24h-tab (Bupropion hcl) .... Take 1 tablet by mouth once a day 3)  Celexa 40 Mg Tabs (Citalopram hydrobromide) .Marland Kitchen.. 1 by mouth daily 4)  Zyrtec Allergy 10 Mg Tabs (Cetirizine  hcl) .Marland Kitchen.. 1 by mouth once daily in evening as needed 5)  Chewable Calcium/d 300-100 Mg-unit Chew (Calcium-vitamin d) .... 2 by mouth daily 6)  Ibuprofen 200 Mg Tabs (Ibuprofen) .... Otc as directed. 7)  Miralax Powd (Polyethylene glycol 3350) .... Take 1 capful  by mouth daily 8)  Multivitamins Tabs (Multiple vitamin) .Marland Kitchen.. 1 by mouth daily 9)  Meclizine Hcl 25 Mg Tabs (Meclizine hcl) .Marland Kitchen.. 1 by mouth up to three times a day as needed dizziness watch for sedation 10)  Ambien 10 Mg Tabs (Zolpidem tartrate) .... Once daily at night as needed 11)  L-lysine 1000 Mg Tabs (Lysine) .... 3 -4 times per week  Other Orders: Est. Patient Level III (04540) DME Referral (DME)  Patient Instructions: 1)  Will have your home care company check your mask fit 2)  Try cutting down dose of ambien as tolerated 3)  Follow up in 6 months

## 2010-11-28 ENCOUNTER — Telehealth: Payer: Self-pay | Admitting: Family Medicine

## 2010-12-02 NOTE — Letter (Signed)
Summary: Out of Work  [facility]  [address] [place], [place] [address]   Phone: [phone]  Fax: [phone]    November 24, 2010   Employee:  LANDI BISCARDI    To Whom It May Concern:   For Medical reasons, please excuse the above named employee from work for the following dates:  Start:  November 24, 2010   End:   until 24 hours fever free.  If you need additional information, please feel free to contact our office.         Sincerely,    Eustaquio Boyden  MD

## 2010-12-02 NOTE — Assessment & Plan Note (Signed)
Summary: SINUS SYMPTOMS   Vital Signs:  Patient profile:   35 year old female Weight:      182.75 pounds O2 Sat:      99 % on Room air Temp:     99.6 degrees F oral Pulse rate:   100 / minute Pulse rhythm:   regular BP sitting:   104 / 60  (left arm) Cuff size:   large  Vitals Entered By: Selena Batten Dance CMA (AAMA) (November 24, 2010 2:39 PM)  O2 Flow:  Room air CC: Sinus symptoms,fever   History of Present Illness: CC: sinus congestion, fever  3d h/o fevers to 101.6, chest soreness, not feeling well.  at night NS.  chills.  Sinus congestion all this morning.  Cough productive of phlegm and increasing frequency.  + nausea and mild diarrhea.  poor appetite.  Trying to stay hydrated.  + body aches.  Mild sinus pain but not like previous infections.  sudden onset sxs since 54.  No ST.  No abd pain, vomiting, rashes.  No HA, ear pain or tooth pain.  No sick contacts at home.  Works at [employer].  No smokers at home.  No h/o asthma.  Tried dayquil/nyquil and tylenol.  h/o sinus infections in past but this feels different.  Got flu shot this year and has had pertussis shot.  Current Medications (verified): 1)  Clonazepam 0.5 Mg  Tabs (Clonazepam) .... Take 2 By Mouth At Night 2)  Wellbutrin Xl 300 Mg Xr24h-Tab (Bupropion Hcl) .... Take 1 Tablet By Mouth Once A Day 3)  Celexa 40 Mg Tabs (Citalopram Hydrobromide) .Marland Kitchen.. 1 By Mouth Daily 4)  Zyrtec Allergy 10 Mg Tabs (Cetirizine Hcl) .Marland Kitchen.. 1 By Mouth Once Daily in Evening As Needed 5)  Chewable Calcium/d 300-100 Mg-Unit Chew (Calcium-Vitamin D) .... 2 By Mouth Daily 6)  Ibuprofen 200 Mg Tabs (Ibuprofen) .... Otc As Directed. 7)  Miralax   Powd (Polyethylene Glycol 3350) .... Take 1 Capful  By Mouth Daily 8)  Multivitamins  Tabs (Multiple Vitamin) .Marland Kitchen.. 1 By Mouth Daily 9)  Meclizine Hcl 25 Mg Tabs (Meclizine Hcl) .Marland Kitchen.. 1 By Mouth Up To Three Times A Day As Needed Dizziness Watch For Sedation 10)  Ambien 10 Mg Tabs (Zolpidem Tartrate)  .... Once Daily At Night As Needed 11)  L-Lysine 1000 Mg Tabs (Lysine) .... 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  congested Head:  normocephalic, atraumatic, and no abnormalities observed.  no sinus tenderness Eyes:  PERRLA, EOMI, no injection Ears:  TMs clear bilaterally Nose:  nares congested and dry purulent mucous bilaterally Mouth:  MMM, no pharyngeal erythema/edema/exudates Neck:  no LAD  Lungs:  Normal respiratory effort, chest expands symmetrically. Lungs are clear to auscultation, no crackles or wheezes. Heart:  Normal rate and regular rhythm. S1 and S2 normal without gallop, murmur, click, rub or other extra sounds. Pulses:  2+ rad pulses, brisk cap refill Extremities:  no pedal edema   Impression & Recommendations:  Problem # 1:  INFLUENZA LIKE ILLNESS (ICD-487.1) suspicious for flu vs early sinusitis/bronchitis.  discussed precautions and supportive care.  recommend out of work until fever free for 24 hours.  tylenol/motrin for fever.  push fluids.  a zpack provided to hold on to in case not improving as expected or any  deterioration.  Complete Medication List: 1)  Clonazepam 0.5 Mg Tabs (Clonazepam) .... Take 2 by mouth at night 2)  Wellbutrin Xl 300 Mg Xr24h-tab (Bupropion hcl) .... Take 1 tablet by mouth once a day 3)  Celexa 40 Mg Tabs (Citalopram hydrobromide) .Marland Kitchen.. 1 by mouth daily 4)  Zyrtec Allergy 10 Mg Tabs (Cetirizine hcl) .Marland Kitchen.. 1 by mouth once daily in evening as needed 5)  Chewable Calcium/d 300-100 Mg-unit Chew (Calcium-vitamin d) .... 2 by mouth daily 6)  Ibuprofen 200 Mg Tabs (Ibuprofen) .... Otc as directed. 7)  Miralax Powd (Polyethylene glycol 3350) .... Take 1 capful  by mouth daily 8)  Multivitamins Tabs (Multiple vitamin) .Marland Kitchen.. 1 by mouth daily 9)  Meclizine Hcl 25 Mg Tabs (Meclizine hcl) .Marland Kitchen.. 1 by mouth up to three times a day as needed dizziness watch for sedation 10)  Ambien 10 Mg Tabs (Zolpidem tartrate) .... Once daily at night as needed 11)  L-lysine 1000 Mg Tabs (Lysine) .... 3 -4 times per week 12)  Zithromax Z-pak 250 Mg Tabs (Azithromycin) .Marland Kitchen.. 1 daily  Patient Instructions: 1)  Sounds like you could have flu versus early sinus infection. 2)  Antibiotics are not needed for this.  Viral infections usually take 7-10 days to resolve.  The cough can last 4 weeks to go away. 3)  Push fluids and plenty of rest. 4)  Mostly supportive care. 5)  Antibiotic to hold on to in case not improving as expected (azithromycin). 6)  Out of work until 24 hours fever free. 7)  Please return if you are not improving as expected, or if you have high fevers (>101.5) or difficulty swallowing. 8)  Call clinic with questions.  Pleasure to see you today!  Prescriptions: ZITHROMAX Z-PAK 250 MG TABS (AZITHROMYCIN) 1 daily  #1 x 0   Entered and Authorized by:   Eustaquio Boyden  MD   Signed by:   Eustaquio Boyden  MD on 11/24/2010   Method used:   Print then Give to Patient   RxID:   [id number]    Orders Added: 1)  Est. Patient Level III [10272]    Current Allergies (reviewed today): No  known allergies

## 2010-12-03 ENCOUNTER — Ambulatory Visit (INDEPENDENT_AMBULATORY_CARE_PROVIDER_SITE_OTHER): Payer: BC Managed Care – PPO

## 2010-12-03 ENCOUNTER — Encounter: Payer: Self-pay | Admitting: Family Medicine

## 2010-12-03 DIAGNOSIS — Z23 Encounter for immunization: Secondary | ICD-10-CM

## 2010-12-08 ENCOUNTER — Encounter: Payer: Self-pay | Admitting: Pulmonary Disease

## 2010-12-11 NOTE — Assessment & Plan Note (Signed)
Summary: tdap  Nurse Visit   Allergies: No Known Drug Allergies  Immunizations Administered:  Tetanus Vaccine:    Vaccine Type: Tdap    Site: left deltoid    Mfr: GlaxoSmithKline    Dose: 0.5 ml    Route: IM    Given by: Linde Gillis CMA (AAMA)    Exp. Date: 07/24/2012    Lot #: [id number]    VIS given: 08/22/08 version given December 03, 2010.  Orders Added: 1)  Tdap => 73yrs IM [90715] 2)  Admin 1st Vaccine [09811]

## 2010-12-11 NOTE — Progress Notes (Signed)
Summary: ? Update Tdap & question about cough  Phone Note Call from Patient Call back at Home Phone [phone]   Caller: Patient Call For: Judith Part MD Summary of Call: Patient is doing much better since her OV.  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  Should she update her Tdap? Initial call taken by: Delilah Shan CMA Duncan Dull),  November 28, 2010 4:19 PM  Follow-up for Phone Call        I think it would be a good idea to come in for a tdap - please schedule  Follow-up by: Judith Part MD,  November 28, 2010 4:24 PM  Additional Follow-up for Phone Call Additional follow up Details #1::        Patient notified as instructed by telephone. Pt scheduled nurse visit for tdap on 12/03/10 at 3:45pm. Pt asked question that she is still coughing (productive cough ,pt not sure of color of phlegm), no fever now, no wheezing. Pt said overall she feels better except for the cough. Pt saw Dr Sharen Hones on 11/24/10 and got Zpack, and pt said Dr Sharen Hones said it might take a month to get rid of the cough but she wanted Dr Royden Purl opinion. Pt is taking Dayquil and Nyquil but does not seem to be helping with the cough. Pt did not want to make appt unless Dr Milinda Antis thought necessary.Please advise. Lewanda Rife LPN  December 01, 2010 1:50 PM     Additional Follow-up for Phone Call Additional follow up Details #2::    the cough can last a long time -this is true-- if not worsening and no fever I think is ok to get her shot  but f/u if worse Follow-up by: Judith Part MD,  December 01, 2010 1:56 PM  Additional Follow-up for Phone Call Additional follow up Details #3:: Details for Additional Follow-up Action Taken: Patient notified as instructed by telephone.  Additional Follow-up by: Lewanda Rife LPN,  December 01, 2010 2:31 PM

## 2010-12-23 NOTE — Miscellaneous (Signed)
Summary: CPAP download 11/07/10 to 12/08/10  Clinical Lists Changes Used on 30 of 32 night with average 5hrs 59 min.  Average AHI 6 with CPAP 13 cm H2O.  Will have my nurse inform pt that CPAP download looked good, and no change to current set up.  Appended Document: CPAP download 11/07/10 to 12/08/10 lmomtcb x1   Appended Document: CPAP download 11/07/10 to 12/08/10 lmomtcb x2   Appended Document: CPAP download 11/07/10 to 12/08/10 pt verbalized understanding of the results

## 2010-12-29 ENCOUNTER — Telehealth: Payer: Self-pay | Admitting: *Deleted

## 2010-12-29 MED ORDER — MOMETASONE FUROATE 50 MCG/ACT NA SUSP: 2.0000 | Freq: Every day | NASAL | Status: DC

## 2010-12-29 NOTE — Telephone Encounter (Signed)
Medication phoned to pharmacy as instructed.  Left message for pt to call back as instructed.

## 2010-12-29 NOTE — Telephone Encounter (Signed)
Here is px for nasonex for call in

## 2010-12-29 NOTE — Telephone Encounter (Signed)
Patient is asking for a rx for nasonex. She says that she used this a couple of years ago and it worked well for her, but she was able to do without it the last 2 years now she is having a terrible time with her allergies and is asking if she could get rx for it called to [facility] [address].

## 2010-12-31 NOTE — Telephone Encounter (Signed)
Left message for pt to call back  °

## 2011-01-01 NOTE — Telephone Encounter (Signed)
Left message for pt to call back on [phone] and [phone] as instructed.

## 2011-01-01 NOTE — Telephone Encounter (Signed)
Patient notified as instructed by telephone. Pt had already picked up Nasonex.

## 2011-01-12 ENCOUNTER — Encounter: Payer: Self-pay | Admitting: Pulmonary Disease

## 2011-02-03 ENCOUNTER — Telehealth: Payer: Self-pay | Admitting: *Deleted

## 2011-02-03 NOTE — Telephone Encounter (Signed)
Patient says that the nasonex is too expensive and is asking if she can get something less expensive called in to [facility] [address].

## 2011-02-03 NOTE — Telephone Encounter (Signed)
Have her check with her insurance to see which steroid nasal spray is covered best -- I expect it will be generic flonase -- let me know and I will call it in

## 2011-02-03 NOTE — Telephone Encounter (Signed)
Patient notified as instructed by telephone. Pt will call back with info.

## 2011-02-04 MED ORDER — FLUTICASONE PROPIONATE 50 MCG/ACT NA SUSP: 2.0000 | Freq: Every day | NASAL | Status: DC

## 2011-02-04 NOTE — Telephone Encounter (Signed)
Patient notified as instructed by telephone. Medication phoned to [facility] pharmacy as instructed.

## 2011-02-04 NOTE — Telephone Encounter (Signed)
Px written for call in   

## 2011-02-04 NOTE — Telephone Encounter (Signed)
Patient called back and she says that they cover the fluticasone.

## 2011-04-06 ENCOUNTER — Telehealth: Payer: Self-pay | Admitting: *Deleted

## 2011-04-06 NOTE — Telephone Encounter (Signed)
I agree- if worse overnight or abd pain or fever over 101- go to UC or ER Otherwise f/u if not improving

## 2011-04-06 NOTE — Telephone Encounter (Signed)
Patient notified as instructed by telephone. 

## 2011-04-06 NOTE — Telephone Encounter (Signed)
Patient says that this morning she woke up and was having some abdominal cramping, felt nauseas, then had some diarrhea and threw up once. She is feeling some better now and has only thrown up a couple of times. I advised her to eat a very bland diet (Brats diet) and to drink plenty of clear liquid and if not improved within 24 to 48 hrs to call back.

## 2011-04-14 ENCOUNTER — Encounter: Payer: Self-pay | Admitting: Family Medicine

## 2011-04-14 ENCOUNTER — Ambulatory Visit (INDEPENDENT_AMBULATORY_CARE_PROVIDER_SITE_OTHER): Payer: BC Managed Care – PPO | Admitting: Family Medicine

## 2011-04-14 VITALS — BP 110/70 | HR 68 | Temp 98.4°F | Wt 164.0 lb

## 2011-04-14 DIAGNOSIS — K589 Irritable bowel syndrome without diarrhea: Secondary | ICD-10-CM

## 2011-04-14 MED ORDER — HYOSCYAMINE SULFATE 0.125 MG PO TABS: 0.1250 mg | ORAL_TABLET | Freq: Four times a day (QID) | ORAL | Status: DC | PRN

## 2011-04-14 NOTE — Progress Notes (Signed)
H/o IBS, constipation predominant, usually controlled with miralax.   9 days ago had lower abd cramp and some loose stools.  Felt okay until the next AM initially but then cramping and diarrhea came back.  She eventually vomited.  She had dec in appetite, was trying to add po solids back in by Wednesday.  As the week went on, she added the miralax back. By Saturday AM, the cramps returned and she took 1/2 dose of imodium. It seemed like it helped the cramps. Sunday she had limited po solids and felt well. Monday AM and today this AM with cramps again, lasted about 1 hour with diarrhea.    No fevers.  Not blood in stool noted.  No blood in vomit.  No known sick contacts.  Had travelled to [place] but not out of the state.  No new foods or meds o/w.    Meds, vitals, and allergies reviewed.   ROS: See HPI.  Otherwise, noncontributory.  GEN: nad, alert and oriented HEENT: mucous membranes moist NECK: supple w/o LA CV: rrr. PULM: ctab, no inc wob ABD: soft, +bs, not ttp, no masses felt EXT: no edema SKIN: no acute rash

## 2011-04-14 NOTE — Patient Instructions (Addendum)
Start the hyoscyamine today and let us know if you don't have some improvement.  I would try to limit the imodium and the miralax in the meantime.  Drink plenty of fluids.  If you pass blood or have other concerns, we need to know.

## 2011-04-14 NOTE — Assessment & Plan Note (Addendum)
>  25 min spent with face to face with patient, >50% counseling.  I don't see a focal cause.  This may be due to IBS with colonic spasm. She has intermittent sx w/o red flag items on hx/exam.  I would try the rx as below to see if this helped with spasm, ie diagnostic and therapeutic.  She agrees.  If continued sx, she'll notify the clinic.  Okay for outpatient f/u.

## 2011-05-04 ENCOUNTER — Ambulatory Visit (INDEPENDENT_AMBULATORY_CARE_PROVIDER_SITE_OTHER): Payer: BC Managed Care – PPO | Admitting: Family Medicine

## 2011-05-04 ENCOUNTER — Encounter: Payer: Self-pay | Admitting: Family Medicine

## 2011-05-04 DIAGNOSIS — R109 Unspecified abdominal pain: Secondary | ICD-10-CM

## 2011-05-04 DIAGNOSIS — R1084 Generalized abdominal pain: Secondary | ICD-10-CM | POA: Insufficient documentation

## 2011-05-04 DIAGNOSIS — R197 Diarrhea, unspecified: Secondary | ICD-10-CM

## 2011-05-04 DIAGNOSIS — Z209 Contact with and (suspected) exposure to unspecified communicable disease: Secondary | ICD-10-CM | POA: Insufficient documentation
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
## 2011-05-04 NOTE — Patient Instructions (Signed)
Stop all carbonated beverages / also use straw to reduce amt of air you swallow Doing some stool tests  Let me know what your mom's dx is - if she knows  After stool tests may consider GI consult
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
## 2011-05-05 LAB — GIARDIA/CRYPTOSPORIDIUM (EIA): Giardia Screen (EIA): NEGATIVE

## 2011-05-08 ENCOUNTER — Encounter: Payer: Self-pay | Admitting: Pulmonary Disease

## 2011-05-08 ENCOUNTER — Ambulatory Visit (INDEPENDENT_AMBULATORY_CARE_PROVIDER_SITE_OTHER): Payer: BC Managed Care – PPO | Admitting: Pulmonary Disease

## 2011-05-08 VITALS — BP 110/60 | HR 73 | Temp 97.4°F | Ht 63.0 in | Wt 163.0 lb

## 2011-05-08 DIAGNOSIS — G4733 Obstructive sleep apnea (adult) (pediatric): Secondary | ICD-10-CM

## 2011-05-08 LAB — STOOL CULTURE

## 2011-05-08 NOTE — Assessment & Plan Note (Addendum)
She is compliant with CPAP and demonstrates improvement in her sleep and daytime function.  No change to current set up.  She can continue ambien and melatonin as needed.  Advised her that she seems to be doing well w/o sleep aides at present, and may not need to use these on a regular basis.

## 2011-05-08 NOTE — Patient Instructions (Signed)
Follow-up in one year.

## 2011-05-08 NOTE — Progress Notes (Signed)
  Subjective:    Patient ID: Debra Edwards, female    DOB: [birth year], 35 y.o.   MRN: [id number]  HPI  35 yo female with sleep apnea.  She has a nasal mask.  Her current mask is working much better.  She goes to bed at 11 pm and wakes up at 8 am.  She feels rested during the day.  Since she has been on summer recess she has not needed to use ambien or melatonin on a regular basis.  In fact she has not needed to use either of these medications for the past several weeks.  Review of Systems     Objective:   Physical Exam  BP 110/60  Pulse 73  Temp(Src) 97.4 F (36.3 C) (Oral)  Ht 5\' 3"  (1.6 m)  Wt 163 lb (73.936 kg)  BMI 28.87 kg/m2  SpO2 96%  LMP 04/01/2011  General - Healthy HEENT - no sinus tenderness, no oral exudate Cardiac - s1s2 regular Chest - CTA Abd - soft, nontender Ext - no edema Psych - normal mood, behavior Neuro - normal strength, CN intact     Assessment & Plan:   OBSTRUCTIVE SLEEP APNEA She is compliant with CPAP and demonstrates improvement in her sleep and daytime function.  No change to current set up.  She can continue ambien and melatonin as needed.  Advised her that she seems to be doing well w/o sleep aides at present, and may not need to use these on a regular basis.    Updated Medication List Outpatient Encounter Prescriptions as of 05/08/2011  Medication Sig Dispense Refill  . buPROPion (WELLBUTRIN XL) 300 MG 24 hr tablet Take 300 mg by mouth daily.        . Calcium Carb-Ergocalciferol (CHEWABLE CALCIUM/D) 300-100 MG-UNIT CHEW Chew 2 tablets by mouth daily.        . clonazePAM (KLONOPIN) 0.5 MG tablet Take 1 mg by mouth at bedtime as needed.        Marland Kitchen escitalopram (LEXAPRO) 10 MG tablet Take 10 mg by mouth daily.        . fluticasone (FLONASE) 50 MCG/ACT nasal spray 2 sprays by Nasal route daily.  16 g  11  . hyoscyamine (LEVSIN, ANASPAZ) 0.125 MG tablet Take 0.125 mg by mouth every 6 (six) hours as needed.        Marland Kitchen ibuprofen  (ADVIL,MOTRIN) 200 MG tablet Take 200 mg by mouth every 6 (six) hours as needed.        . meclizine (ANTIVERT) 25 MG tablet Take 25 mg by mouth 3 (three) times daily as needed.        . Melatonin 3 MG TABS Take 3-6 mg by mouth at bedtime as needed.        . Multiple Vitamin (MULTIVITAMIN) tablet Take 1 tablet by mouth daily.        . ranitidine (ZANTAC) 150 MG capsule Take 150 mg by mouth daily. As needed      . zolpidem (AMBIEN) 10 MG tablet Take 10 mg by mouth at bedtime as needed.        Marland Kitchen DISCONTD: cetirizine (ZYRTEC) 10 MG tablet Take 10 mg by mouth daily.        Marland Kitchen DISCONTD: L-Lysine 1000 MG TABS Take by mouth 4 (four) times a week.        Marland Kitchen DISCONTD: polyethylene glycol (MIRALAX / GLYCOLAX) packet Take 17 g by mouth daily.

## 2011-05-13 ENCOUNTER — Telehealth: Payer: Self-pay | Admitting: Family Medicine

## 2011-05-13 DIAGNOSIS — R197 Diarrhea, unspecified: Secondary | ICD-10-CM

## 2011-05-13 NOTE — Telephone Encounter (Signed)
Will do ref to GI 

## 2011-05-13 NOTE — Telephone Encounter (Signed)
Message copied by Judy Pimple on Wed May 13, 2011  8:14 AM ------      Message from: Patience Musca      Created: Mon May 11, 2011  6:03 PM      Regarding: GI consult       Please see result note 05/11/11.      ----- Message -----         From: Roxy Manns, MD         Sent: 05/09/2011   6:48 PM           To: Yetta Glassman, LPN            Stool cx and other labs are ok       I want to refer her to GI       Let me know if agreeable

## 2011-05-14 ENCOUNTER — Encounter: Payer: Self-pay | Admitting: Gastroenterology

## 2011-05-19 ENCOUNTER — Ambulatory Visit (INDEPENDENT_AMBULATORY_CARE_PROVIDER_SITE_OTHER): Payer: BC Managed Care – PPO | Admitting: Gastroenterology

## 2011-05-19 ENCOUNTER — Encounter: Payer: Self-pay | Admitting: Gastroenterology

## 2011-05-19 ENCOUNTER — Other Ambulatory Visit (INDEPENDENT_AMBULATORY_CARE_PROVIDER_SITE_OTHER): Payer: BC Managed Care – PPO

## 2011-05-19 DIAGNOSIS — R197 Diarrhea, unspecified: Secondary | ICD-10-CM

## 2011-05-19 DIAGNOSIS — R109 Unspecified abdominal pain: Secondary | ICD-10-CM

## 2011-05-19 LAB — CBC WITH DIFFERENTIAL/PLATELET
Basophils Relative: 0.4 % (ref 0.0–3.0)
Eosinophils Absolute: 0 10*3/uL (ref 0.0–0.7)
Eosinophils Relative: 1.2 % (ref 0.0–5.0)
HCT: 38.4 % (ref 36.0–46.0)
Lymphs Abs: 1.2 10*3/uL (ref 0.7–4.0)
MCHC: 33.7 g/dL (ref 30.0–36.0)
MCV: 87.3 fl (ref 78.0–100.0)
Monocytes Absolute: 0.3 10*3/uL (ref 0.1–1.0)
RBC: 4.4 Mil/uL (ref 3.87–5.11)
WBC: 3.9 10*3/uL — ABNORMAL LOW (ref 4.5–10.5)

## 2011-05-19 LAB — COMPREHENSIVE METABOLIC PANEL
Albumin: 4.4 g/dL (ref 3.5–5.2)
Alkaline Phosphatase: 46 U/L (ref 39–117)
BUN: 7 mg/dL (ref 6–23)
Creatinine, Ser: 0.7 mg/dL (ref 0.4–1.2)
Glucose, Bld: 92 mg/dL (ref 70–99)
Total Bilirubin: 0.5 mg/dL (ref 0.3–1.2)

## 2011-05-19 LAB — IGA: IgA: 84 mg/dL (ref 68–378)

## 2011-05-19 LAB — SEDIMENTATION RATE: Sed Rate: 11 mm/hr (ref 0–22)

## 2011-05-19 MED ORDER — PEG-KCL-NACL-NASULF-NA ASC-C 100 G PO SOLR: 1.0000 | ORAL | Status: DC
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
## 2011-05-19 NOTE — Patient Instructions (Signed)
You will have labs checked today in the basement lab.  Please head down after you check out with the front desk  (cbc, cmet, esr, celiac profile, total IgA). You will be set up for a colonoscopy at [facility] with propofol. A copy of this information will be made available to Dr. Milinda Antis.

## 2011-05-20 LAB — GLIA (IGA/G) + TTG IGA
Gliadin IgA: 1.5 U/mL (ref <20)
Gliadin IgG: 2.3 U/mL (ref <20)
Tissue Transglutaminase Ab, IgA: 0.7 U/mL (ref <20)

## 2011-06-22 ENCOUNTER — Telehealth: Payer: Self-pay | Admitting: Gastroenterology

## 2011-06-22 NOTE — Telephone Encounter (Signed)
Pt had question about prepping before colon, she has been constipated and was worried that the prep would not work I did advise her that the prep would work but if she had any problems to call our office

## 2011-06-25 ENCOUNTER — Ambulatory Visit (HOSPITAL_COMMUNITY)
Admission: RE | Admit: 2011-06-25 | Discharge: 2011-06-25 | Disposition: A | Discharge status: Home / Self Care | Payer: BC Managed Care – PPO | Source: Ambulatory Visit | Attending: Gastroenterology | Admitting: Gastroenterology

## 2011-06-25 ENCOUNTER — Other Ambulatory Visit: Payer: BC Managed Care – PPO | Admitting: Gastroenterology

## 2011-06-25 ENCOUNTER — Telehealth: Payer: Self-pay | Admitting: Gastroenterology

## 2011-06-25 DIAGNOSIS — R109 Unspecified abdominal pain: Secondary | ICD-10-CM | POA: Insufficient documentation

## 2011-06-25 DIAGNOSIS — K219 Gastro-esophageal reflux disease without esophagitis: Secondary | ICD-10-CM | POA: Insufficient documentation

## 2011-06-25 DIAGNOSIS — R197 Diarrhea, unspecified: Secondary | ICD-10-CM | POA: Insufficient documentation

## 2011-06-25 DIAGNOSIS — K5900 Constipation, unspecified: Secondary | ICD-10-CM | POA: Insufficient documentation

## 2011-06-25 DIAGNOSIS — G4733 Obstructive sleep apnea (adult) (pediatric): Secondary | ICD-10-CM | POA: Insufficient documentation

## 2011-06-25 DIAGNOSIS — K589 Irritable bowel syndrome without diarrhea: Secondary | ICD-10-CM | POA: Insufficient documentation

## 2011-06-25 MED ORDER — LUBIPROSTONE 8 MCG PO CAPS
8.0000 ug | ORAL_CAPSULE | Freq: Two times a day (BID) | ORAL | Status: DC
Start: 2011-06-25 — End: 2011-07-22

## 2011-06-25 NOTE — Telephone Encounter (Signed)
Pt has been scheduled.  °

## 2011-06-25 NOTE — Telephone Encounter (Signed)
i called her in [place], she needs rov with me in 3-4 weeks. thanks

## 2011-06-25 NOTE — Telephone Encounter (Signed)
Left message on machine to call back  

## 2011-07-05 ENCOUNTER — Telehealth: Payer: Self-pay | Admitting: Family Medicine

## 2011-07-05 DIAGNOSIS — D649 Anemia, unspecified: Secondary | ICD-10-CM

## 2011-07-05 DIAGNOSIS — Z0000 Encounter for general adult medical examination without abnormal findings: Secondary | ICD-10-CM

## 2011-07-05 NOTE — Telephone Encounter (Signed)
Message copied by Judy Pimple on Sun Jul 05, 2011 12:22 PM ------      Message from: Baldomero Lamy      Created: Fri Jul 03, 2011  1:14 PM      Regarding: Cpx labs mon 10/1       Please order  future cpx labs for pt's upcomming lab appt.      Thanks      Rodney Booze

## 2011-07-06 ENCOUNTER — Other Ambulatory Visit (INDEPENDENT_AMBULATORY_CARE_PROVIDER_SITE_OTHER): Payer: BC Managed Care – PPO

## 2011-07-06 DIAGNOSIS — D649 Anemia, unspecified: Secondary | ICD-10-CM

## 2011-07-06 DIAGNOSIS — Z0000 Encounter for general adult medical examination without abnormal findings: Secondary | ICD-10-CM

## 2011-07-06 LAB — CBC WITH DIFFERENTIAL/PLATELET
Basophils Absolute: 0 10*3/uL (ref 0.0–0.1)
Eosinophils Relative: 1.3 % (ref 0.0–5.0)
Monocytes Relative: 8.4 % (ref 3.0–12.0)
Neutrophils Relative %: 53.5 % (ref 43.0–77.0)
Platelets: 144 10*3/uL — ABNORMAL LOW (ref 150.0–400.0)
RDW: 14.3 % (ref 11.5–14.6)
WBC: 3.9 10*3/uL — ABNORMAL LOW (ref 4.5–10.5)

## 2011-07-06 LAB — COMPREHENSIVE METABOLIC PANEL
ALT: 12 U/L (ref 0–35)
Albumin: 3.9 g/dL (ref 3.5–5.2)
Alkaline Phosphatase: 47 U/L (ref 39–117)
CO2: 27 mEq/L (ref 19–32)
GFR: 86.33 mL/min (ref >60.00)
Glucose, Bld: 90 mg/dL (ref 70–99)
Potassium: 3.8 mEq/L (ref 3.5–5.1)
Sodium: 139 mEq/L (ref 135–145)
Total Protein: 6.5 g/dL (ref 6.0–8.3)

## 2011-07-06 LAB — LIPID PANEL
HDL: 58 mg/dL (ref >39.00)
Total CHOL/HDL Ratio: 2
VLDL: 7.2 mg/dL (ref 0.0–40.0)

## 2011-07-06 LAB — TSH: TSH: 1.64 u[IU]/mL (ref 0.35–5.50)

## 2011-07-10 ENCOUNTER — Encounter: Payer: Self-pay | Admitting: Family Medicine

## 2011-07-10 ENCOUNTER — Ambulatory Visit (INDEPENDENT_AMBULATORY_CARE_PROVIDER_SITE_OTHER): Payer: BC Managed Care – PPO | Admitting: Family Medicine

## 2011-07-10 DIAGNOSIS — Z0000 Encounter for general adult medical examination without abnormal findings: Secondary | ICD-10-CM

## 2011-07-10 DIAGNOSIS — N946 Dysmenorrhea, unspecified: Secondary | ICD-10-CM

## 2011-07-10 DIAGNOSIS — Z01419 Encounter for gynecological examination (general) (routine) without abnormal findings: Secondary | ICD-10-CM

## 2011-07-10 DIAGNOSIS — Z23 Encounter for immunization: Secondary | ICD-10-CM

## 2011-07-10 DIAGNOSIS — Z1231 Encounter for screening mammogram for malignant neoplasm of breast: Secondary | ICD-10-CM

## 2011-07-10 DIAGNOSIS — D649 Anemia, unspecified: Secondary | ICD-10-CM

## 2011-07-10 NOTE — Assessment & Plan Note (Signed)
Referred for 35 year old baseline mam Dense breasts on exam Enc self exams monthly

## 2011-07-10 NOTE — Assessment & Plan Note (Signed)
Reviewed health habits including diet and exercise and skin cancer prevention Also reviewed health mt list, fam hx and immunizations   Rev wellness lab in detail Disc plan for further diet and exercise

## 2011-07-10 NOTE — Progress Notes (Signed)
Subjective:    Patient ID: Debra Edwards, female    DOB: 08-02-1976, 35 y.o.   MRN: [id number]  HPI Here for annual health mt exam and to review chronic med problems  Has been feeling ok overall   Saw Dr Christella Hartigan about the stool change  - nl colonoscopy  Wanted her to try amitiza -- gave it a good try but it sedated her  Is using miralax - that works better overall  Now she stays on a regimen with that and she will see Dr Christella Hartigan  Sounds like she has IBS   bp 100/62 Wt is stable with bmi of 28  Lipids great with LDL 64 Lab Results  Component Value Date   CHOL 129 07/06/2011   CHOL 144 07/02/2010   CHOL 129 05/20/2009   Lab Results  Component Value Date   HDL 58.00 07/06/2011   HDL 69.62 07/02/2010   HDL 95.28 05/20/2009   Lab Results  Component Value Date   LDLCALC 64 07/06/2011   LDLCALC 79 07/02/2010   LDLCALC 66 05/20/2009   Lab Results  Component Value Date   TRIG 36.0 07/06/2011   TRIG 73.0 07/02/2010   TRIG 36.0 05/20/2009   Lab Results  Component Value Date   CHOLHDL 2 07/06/2011   CHOLHDL 3 07/02/2010   CHOLHDL 2 05/20/2009   No results found for this basename: LDLDIRECT   good cholesterol runs in family   Diet- is vegetarian and eats lots of fiber  2/3 of meal is fruit and veg Downfall is occ emotional eating  Has been able to loose some wt since last winter- was up to 180s Summer - did some hiking , now is trying to exercise 3-4 times per week - as little as walking a mile or housework Walks instead of rides   Anemia with 11.4 hb today down from 12.9 and platelet 144 Periods have changed  Over past year - getting more intense (has always been heavy) with worse dysmennorhea , also pms and just feeling bad - occ irregular  Hesitant to do OC - that made her depressed   Never had a mam On self exam -- is very lumpy in general- nothing new   Flu shot-today   Tdap  2/12  Pap 8/10 Has not been sexually active ever  Will do exam without pap today No pain  or d/c  No hx of fibroids  Patient Active Problem List  Diagnoses  . NEOPLASM, SKIN, UNCERTAIN BEHAVIOR  . UNSPECIFIED ANEMIA  . BRUXISM  . PTSD  . OBSTRUCTIVE SLEEP APNEA  . RESTLESS LEG SYNDROME  . ALLERGIC RHINITIS  . FIBROCYSTIC BREAST DISEASE  . DYSMENORRHEA  . HAND PAIN  . OTHER DYSPNEA AND RESPIRATORY ABNORMALITIES  . IBS (irritable bowel syndrome)  . Abdominal pain  . Infectious disease exposure  . Routine general medical examination at a health care facility  . Other screening mammogram  . Gynecological examination   Past Medical History  Diagnosis Date  . PTSD (post-traumatic stress disorder)     with depressive symptoms  . Constipation, chronic   . Acid reflux     occasional  . Migraines   . 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 Heart murmur     Negative echo in the past  . Urticaria     stress induced  . History of chicken pox   . Anemia in the past  is a blood donor  . Allergic rhinitis, seasonal     vasomotor rhinitis  . Dysmenorrhea   . OSA (obstructive sleep apnea)     PSG 08/20/10 RDI 18.7  . IBS (irritable bowel syndrome)    Past Surgical History  Procedure Date  . Wisdom tooth extraction    History  Substance Use Topics  . Smoking status: Never Smoker   . Smokeless tobacco: Never Used  . Alcohol Use: Yes     Rare   Family History  Problem Relation Age of Onset  . Hypertension Father   . Allergies Father   . Other Mother     Digestive problems-w/u @ [facility]-? bact inf  . Diabetes      GM  . Prostate cancer      GF  . Anxiety disorder      diffusely in family  . Depression      diffusely in family  . Colon cancer Neg Hx    No Known Allergies Current Outpatient Prescriptions on File Prior to Visit  Medication Sig Dispense Refill  . buPROPion (WELLBUTRIN XL) 300 MG 24 hr tablet Take 300 mg by mouth daily.        . Calcium Carb-Ergocalciferol (CHEWABLE CALCIUM/D)  300-100 MG-UNIT CHEW Chew 2 tablets by mouth daily.        . clonazePAM (KLONOPIN) 0.5 MG tablet Take 1 mg by mouth at bedtime.       Marland Kitchen escitalopram (LEXAPRO) 10 MG tablet Take 10 mg by mouth daily.        . fluticasone (FLONASE) 50 MCG/ACT nasal spray 2 sprays by Nasal route daily.  16 g  11  . Multiple Vitamin (MULTIVITAMIN) tablet Take 1 tablet by mouth daily.        . Polyethylene Glycol 3350 (MIRALAX PO) Take by mouth daily.        Marland Kitchen zolpidem (AMBIEN) 10 MG tablet Take 10 mg by mouth at bedtime as needed.        . hyoscyamine (LEVSIN, ANASPAZ) 0.125 MG tablet Take 0.125 mg by mouth every 6 (six) hours as needed.        Marland Kitchen ibuprofen (ADVIL,MOTRIN) 200 MG tablet Take 200 mg by mouth every 6 (six) hours as needed.        . Loperamide HCl (IMODIUM PO) Take by mouth as needed.        . lubiprostone (AMITIZA) 8 MCG capsule Take 1 capsule (8 mcg total) by mouth 2 (two) times daily with a meal.  60 capsule  2  . meclizine (ANTIVERT) 25 MG tablet Take 25 mg by mouth 3 (three) times daily as needed.        . Melatonin 3 MG TABS Take 3-6 mg by mouth at bedtime as needed.        . peg 3350 powder (MOVIPREP) 100 G SOLR Take 1 kit (100 g total) by mouth as directed. See written handout  1 kit  0  . ranitidine (ZANTAC) 150 MG capsule Take 150 mg by mouth daily. As needed          Review of Systems Review of Systems  Constitutional: Negative for fever, appetite change,  and unexpected weight change. is occ fatigued  Eyes: Negative for pain and visual disturbance.  Respiratory: Negative for cough and shortness of breath.   Cardiovascular: Negative for cp or palpitations    Gastrointestinal: Negative for nausea, diarrhea and pos for chronic constipation   Genitourinary: Negative for urgency and frequency.  Skin: Negative for pallor or rash   Neurological: Negative for weakness, light-headedness, numbness and headaches.  Hematological: Negative for adenopathy. Does not bruise/bleed easily.    Psychiatric/Behavioral: Negative for dysphoric mood. The patient is not nervous/anxious.          Objective:   Physical Exam  Constitutional: She appears well-nourished. No distress.       overwt and well appearing   HENT:  Head: Normocephalic and atraumatic.  Right Ear: External ear normal.  Left Ear: External ear normal.  Nose: Nose normal.  Mouth/Throat: Oropharynx is clear and moist. No oropharyngeal exudate.  Eyes: Conjunctivae and EOM are normal. Pupils are equal, round, and reactive to light.  Neck: Normal range of motion. Neck supple. No JVD present. Carotid bruit is not present. No thyromegaly present.  Cardiovascular: Normal rate, regular rhythm, normal heart sounds and intact distal pulses.   Pulmonary/Chest: Breath sounds normal. No respiratory distress. She has no wheezes.  Abdominal: Soft. Bowel sounds are normal. She exhibits no distension and no mass. There is no tenderness.  Genitourinary: Vagina normal and uterus normal. No breast swelling, tenderness, discharge or bleeding. No vaginal discharge found.       No M or tenderness on internal/ bimanual exam  Musculoskeletal: Normal range of motion. She exhibits no edema and no tenderness.  Lymphadenopathy:    She has no cervical adenopathy.  Neurological: She is alert. She has normal reflexes. No cranial nerve deficit. Coordination normal.  Skin: Skin is warm and dry. No rash noted. No erythema. No pallor.  Psychiatric: She has a normal mood and affect.          Assessment & Plan:

## 2011-07-10 NOTE — Assessment & Plan Note (Signed)
This is mild and likely from menses Apprehensive to try iron due to severe constipation Will eat more leafy greens D/w her GI doc Continue to follow

## 2011-07-10 NOTE — Assessment & Plan Note (Signed)
Worse with age- pt does not want to go on OC at this time but may change her mind if it worsens (esp if heavy flow)  Unremarkable exam today (without pap)- pt was never sexually active

## 2011-07-10 NOTE — Patient Instructions (Addendum)
You are mildly anemic , likely from periods but I am hesitant to put you on iron due to constipation  Eat leafy greens and discuss this with GI doctor when you have your follow up  We will schedule a baseline mammogram at check out  Rest of labs look good  Work on healthy diet and exercise

## 2011-07-15 ENCOUNTER — Ambulatory Visit: Payer: BC Managed Care – PPO

## 2011-07-20 ENCOUNTER — Encounter: Payer: Self-pay | Admitting: Gastroenterology

## 2011-07-22 ENCOUNTER — Encounter: Payer: Self-pay | Admitting: Gastroenterology

## 2011-07-22 ENCOUNTER — Ambulatory Visit (INDEPENDENT_AMBULATORY_CARE_PROVIDER_SITE_OTHER): Payer: BC Managed Care – PPO | Admitting: Gastroenterology

## 2011-07-22 ENCOUNTER — Telehealth: Payer: Self-pay | Admitting: *Deleted

## 2011-07-22 DIAGNOSIS — K5900 Constipation, unspecified: Secondary | ICD-10-CM

## 2011-07-22 NOTE — Telephone Encounter (Signed)
Patient notified as instructed by telephone. 

## 2011-07-22 NOTE — Telephone Encounter (Signed)
Pt called to report that she saw Dr.Jacobs and they discussed her iron issue.  He told her it's ok for her to take iron and she is asking if she should buy some otc or does she need a prescription.  Also, she checked with her inurance company and they won't pay for a screening mammogram until she is 39.  [facility].

## 2011-07-22 NOTE — Telephone Encounter (Signed)
She can get otc ferrous sulfate 325 mg once daily-- if that is too constipating take it every other day

## 2011-07-22 NOTE — Progress Notes (Signed)
Review of pertinent gastrointestinal problems: 1. interval bowel syndrome, constipation predominant. Colonoscopy September 2012 was completely normal.  amitiza caused fatigue and at 8 mcg twice daily did not seem to help. MiraLax worked much better for her.  HPI: This is a very pleasant 35 year old woman whom I last saw her colonoscopy about a month ago. She tried an [place] but believes it caused some fatigue and so changed back to MiraLax 2 doses daily. On this regimen she feels great. Her hemoglobin dropped a bit. She has fairly heavy periods. There is concern that she might need to be on iron supplementation in the future and that this can cause worse constipation.  She tried [place], but it made her very sleepy and it didn't help anyway with her consipation.  She is currently on miralax, 2 doses per day and on that regimin she feels fine.  Past Medical History  Diagnosis Date  . PTSD (post-traumatic stress disorder)     with depressive symptoms  . Constipation, chronic   . Acid reflux     occasional  . Migraines   . 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 Heart murmur     Negative echo in the past  . Urticaria     stress induced  . History of chicken pox   . Anemia in the past    is a blood donor  . Allergic rhinitis, seasonal     vasomotor rhinitis  . Dysmenorrhea   . OSA (obstructive sleep apnea)     PSG 08/20/10 RDI 18.7  . IBS (irritable bowel syndrome)     Past Surgical History  Procedure Date  . Wisdom tooth extraction     Current Outpatient Prescriptions  Medication Sig Dispense Refill  . buPROPion (WELLBUTRIN XL) 300 MG 24 hr tablet Take 300 mg by mouth daily.        . Calcium Carb-Ergocalciferol (CHEWABLE CALCIUM/D) 300-100 MG-UNIT CHEW Chew 2 tablets by mouth daily.       . clonazePAM (KLONOPIN) 0.5 MG tablet Take 1 mg by mouth at bedtime.       Marland Kitchen escitalopram (LEXAPRO) 10 MG tablet Take 10 mg by mouth  daily.        . fluticasone (FLONASE) 50 MCG/ACT nasal spray 2 sprays by Nasal route daily.  16 g  11  . ibuprofen (ADVIL,MOTRIN) 200 MG tablet Take 200 mg by mouth every 6 (six) hours as needed.        . meclizine (ANTIVERT) 25 MG tablet Take 25 mg by mouth 3 (three) times daily as needed.        . Melatonin 3 MG TABS Take 3-6 mg by mouth at bedtime as needed.        . Multiple Vitamin (MULTIVITAMIN) tablet Take 1 tablet by mouth daily.        . Polyethylene Glycol 3350 (MIRALAX PO) Take by mouth daily.        . ranitidine (ZANTAC) 150 MG capsule Take 150 mg by mouth daily. As needed      . zolpidem (AMBIEN) 10 MG tablet Take 10 mg by mouth at bedtime as needed.          Allergies as of 07/22/2011  . (No Known Allergies)    Family History  Problem Relation Age of Onset  . Hypertension Father   . Allergies Father   . Other Mother     Digestive problems-w/u @  [facility]-? bact inf  . Diabetes      GM  . Prostate cancer      GF  . Anxiety disorder      diffusely in family  . Depression      diffusely in family  . Colon cancer Neg Hx     History   Social History  . Marital Status: Single    Spouse Name: N/A    Number of Children: N/A  . Years of Education: N/A   Occupational History  . School Counselor    Social History Main Topics  . Smoking status: Never Smoker   . Smokeless tobacco: Never Used  . Alcohol Use: Yes     Rare  . Drug Use: No  . Sexually Active: Not on file   Other Topics Concern  . Not on file   Social History Narrative   Grad school at [employer] counselingNow a counselor in a middle schoolHas cats and involved in cat [id number] aloneHas run triathlon in the pastVegetarian      Physical Exam: BP 92/60  Pulse 76  Ht 5\' 3"  (1.6 m)  Wt 157 lb 6.4 oz (71.396 kg)  BMI 27.88 kg/m2  LMP 07/18/2011 Constitutional: generally well-appearing Psychiatric: alert and oriented x3 Abdomen: soft, nontender, nondistended, no  obvious ascites, no peritoneal signs, normal bowel sounds     Assessment and plan: 35 y.o. female with chronic constipation  Her symptoms are under good control on MiraLax twice daily. If she needs iron supplements I would start at lowest dose possible as it could certainly worsen her constipation. That being said a repeat trial of a higher dose amitiza , increase MiraLax dose, addition of fiber supplements are all further options we have.  She will call or return to see me she has any further questions or concerns.

## 2011-07-22 NOTE — Patient Instructions (Signed)
Continue taking miralax every day.  Even at higher doses, it should be safe. If you are started on iron and it worsens your chronic constipation, call Dr. Christella Hartigan office. A copy of this information will be made available to Dr. Milinda Antis.

## 2011-07-28 ENCOUNTER — Telehealth: Payer: Self-pay | Admitting: *Deleted

## 2011-07-28 NOTE — Telephone Encounter (Signed)
I would take it every day for a month (please add to med list) then re check cbc with diff for anemia please

## 2011-07-28 NOTE — Telephone Encounter (Signed)
Pt was told to take 325 mg's of iron daily.  She is asking how long does she need to take this?  When to recheck?

## 2011-07-28 NOTE — Telephone Encounter (Signed)
Advised pt, lab appt scheduled.

## 2011-07-28 NOTE — Telephone Encounter (Signed)
Left v/m for pt to call back. Added med to med list.

## 2011-08-25 ENCOUNTER — Other Ambulatory Visit: Payer: Self-pay | Admitting: Family Medicine

## 2011-08-25 DIAGNOSIS — D649 Anemia, unspecified: Secondary | ICD-10-CM

## 2011-08-31 ENCOUNTER — Other Ambulatory Visit (INDEPENDENT_AMBULATORY_CARE_PROVIDER_SITE_OTHER): Payer: BC Managed Care – PPO

## 2011-08-31 DIAGNOSIS — D649 Anemia, unspecified: Secondary | ICD-10-CM

## 2011-08-31 LAB — CBC WITH DIFFERENTIAL/PLATELET
Basophils Relative: 0.4 % (ref 0.0–3.0)
Eosinophils Relative: 1.8 % (ref 0.0–5.0)
MCV: 89.4 fl (ref 78.0–100.0)
Monocytes Absolute: 0.2 10*3/uL (ref 0.1–1.0)
Monocytes Relative: 6.4 % (ref 3.0–12.0)
Neutrophils Relative %: 56.4 % (ref 43.0–77.0)
Platelets: 156 10*3/uL (ref 150.0–400.0)
RBC: 4.14 Mil/uL (ref 3.87–5.11)
WBC: 3.8 10*3/uL — ABNORMAL LOW (ref 4.5–10.5)

## 2011-09-07 ENCOUNTER — Telehealth: Payer: Self-pay | Admitting: Pulmonary Disease

## 2011-09-07 DIAGNOSIS — G4733 Obstructive sleep apnea (adult) (pediatric): Secondary | ICD-10-CM

## 2011-09-07 NOTE — Telephone Encounter (Signed)
Pt last saw VS 05/08/11 and told to f/u in 1 year.  Called and spoke with pt.  Pt states her current DME, [employer], no longer accepts her ins which is [employer].  Therefore pt is needing a new DME company.  Will send order to [facility] for help find pt a new DME company.

## 2011-09-11 ENCOUNTER — Telehealth: Payer: Self-pay | Admitting: Internal Medicine

## 2011-09-11 NOTE — Telephone Encounter (Signed)
Informed patient that her has significantly improved it is now in normal range and to continue taking the iron and she can try taking it every other day.

## 2012-01-29 ENCOUNTER — Telehealth: Payer: Self-pay

## 2012-01-29 NOTE — Telephone Encounter (Signed)
Pt wanted to know if MMR was on pts immunization record at our office. I do not see on either Epic or [facility] and no paper chart found. Pt said had record at home and would call back if need immunization.

## 2012-02-17 ENCOUNTER — Telehealth: Payer: Self-pay | Admitting: Family Medicine

## 2012-02-17 NOTE — Telephone Encounter (Signed)
Will see her then 

## 2012-02-17 NOTE — Telephone Encounter (Signed)
Caller: Debra Edwards/Patient; PCP: Roxy Manns A.; CB#: [phone]; ; ; Call regarding Heartburn; LMP 02/12/12. Heartburn, upper abd burning after eating, burping x 2 wks. Feels like something's stuck at the back of her throat at times. Taking Zantac bid w/o relief.  Cut out all acitic foods, is now waiting for 2 hrs to lay down after eating. Appt sched for 1415 02/19/12 with Dr. Milinda Antis. Heartburn Protocol.

## 2012-02-19 ENCOUNTER — Ambulatory Visit (INDEPENDENT_AMBULATORY_CARE_PROVIDER_SITE_OTHER): Payer: BC Managed Care – PPO | Admitting: Family Medicine

## 2012-02-19 ENCOUNTER — Encounter: Payer: Self-pay | Admitting: Family Medicine

## 2012-02-19 VITALS — BP 100/60 | HR 67 | Temp 98.0°F | Ht 62.25 in | Wt 163.8 lb

## 2012-02-19 DIAGNOSIS — K219 Gastro-esophageal reflux disease without esophagitis: Secondary | ICD-10-CM

## 2012-02-19 MED ORDER — OMEPRAZOLE 20 MG PO CPDR: 20.0000 mg | DELAYED_RELEASE_CAPSULE | Freq: Every day | ORAL | Status: DC

## 2012-02-19 NOTE — Progress Notes (Signed)
Subjective:    Patient ID: Debra Edwards, female    DOB: Jul 14, 1976, 36 y.o.   MRN: [id number]  HPI Here for gerd Worse heartburn for the past mo  Burping Feeling of irritation- epigastric Feels like something is stuck in chest Correlated with inc stress at work   Is taking a lot of zantac 150 am and pm - helps some / does not make her symptom free Ran out of antacids - no help  No ppis- has not tried yet    No blood in stool or dark black stool Not a lot of caff No change in diet - but did cut out acidic foods  Stays away from nsaids for GI reasons  Tylenol instead   Wt is up 6 lb with bmi of 29  Is worse at night   Patient Active Problem List  Diagnoses  . NEOPLASM, SKIN, UNCERTAIN BEHAVIOR  . UNSPECIFIED ANEMIA  . BRUXISM  . PTSD  . OBSTRUCTIVE SLEEP APNEA  . RESTLESS LEG SYNDROME  . ALLERGIC RHINITIS  . FIBROCYSTIC BREAST DISEASE  . DYSMENORRHEA  . HAND PAIN  . OTHER DYSPNEA AND RESPIRATORY ABNORMALITIES  . IBS (irritable bowel syndrome)  . Infectious disease exposure  . GERD (gastroesophageal reflux disease)   Past Medical History  Diagnosis Date  . PTSD (post-traumatic stress disorder)     with depressive symptoms  . Constipation, chronic   . Acid reflux     occasional  . Migraines   . 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 Heart murmur     Negative echo in the past  . Urticaria     stress induced  . History of chicken pox   . Anemia in the past    is a blood donor  . Allergic rhinitis, seasonal     vasomotor rhinitis  . Dysmenorrhea   . OSA (obstructive sleep apnea)     PSG 08/20/10 RDI 18.7  . IBS (irritable bowel syndrome)    Past Surgical History  Procedure Date  . Wisdom tooth extraction    History  Substance Use Topics  . Smoking status: Never Smoker   . Smokeless tobacco: Never Used  . Alcohol Use: Yes     Rare   Family History  Problem Relation Age of Onset  .  Hypertension Father   . Allergies Father   . Other Mother     Digestive problems-w/u @ [facility]-? bact inf  . Diabetes      GM  . Prostate cancer      GF  . Anxiety disorder      diffusely in family  . Depression      diffusely in family  . Colon cancer Neg Hx    No Known Allergies Current Outpatient Prescriptions on File Prior to Visit  Medication Sig Dispense Refill  . buPROPion (WELLBUTRIN XL) 300 MG 24 hr tablet Take 300 mg by mouth daily.        . Calcium Carb-Ergocalciferol (CHEWABLE CALCIUM/D) 300-100 MG-UNIT CHEW Chew 2 tablets by mouth daily.       . clonazePAM (KLONOPIN) 0.5 MG tablet Take 1 mg by mouth at bedtime.       Marland Kitchen escitalopram (LEXAPRO) 10 MG tablet Take 10 mg by mouth daily.        . ferrous sulfate 325 (65 FE) MG tablet Take 325 mg by mouth daily.        Marland Kitchen  fluticasone (FLONASE) 50 MCG/ACT nasal spray 2 sprays by Nasal route daily.  16 g  11  . ibuprofen (ADVIL,MOTRIN) 200 MG tablet Take 200 mg by mouth every 6 (six) hours as needed.        . meclizine (ANTIVERT) 25 MG tablet Take 25 mg by mouth 3 (three) times daily as needed.        . Melatonin 3 MG TABS Take 3-6 mg by mouth at bedtime as needed.        . Multiple Vitamin (MULTIVITAMIN) tablet Take 1 tablet by mouth daily.        . Polyethylene Glycol 3350 (MIRALAX PO) Take by mouth daily.        . ranitidine (ZANTAC) 150 MG capsule Take 150 mg by mouth daily. As needed      . zolpidem (AMBIEN) 10 MG tablet Take 10 mg by mouth at bedtime as needed.        Marland Kitchen omeprazole (PRILOSEC) 20 MG capsule Take 1 capsule (20 mg total) by mouth daily. In am before breakfast  30 capsule  11      Review of Systems Review of Systems  Constitutional: Negative for fever, appetite change, fatigue and unexpected weight change.  Eyes: Negative for pain and visual disturbance.  Respiratory: Negative for cough and shortness of breath.   Cardiovascular: Negative for cp or palpitations    Gastrointestinal: Negative for nausea,  diarrhea and constipation. pos for epigastric discomfort/ heartburn/ regugitation, neg for ST or hoarseness Genitourinary: Negative for urgency and frequency.  Skin: Negative for pallor or rash   Neurological: Negative for weakness, light-headedness, numbness and headaches.  Hematological: Negative for adenopathy. Does not bruise/bleed easily.  Psychiatric/Behavioral: Negative for dysphoric mood. The patient is not nervous/anxious.         Objective:   Physical Exam  Constitutional: She appears well-developed and well-nourished. No distress.  HENT:  Head: Normocephalic and atraumatic.  Mouth/Throat: Oropharynx is clear and moist.  Eyes: Conjunctivae and EOM are normal. Pupils are equal, round, and reactive to light. No scleral icterus.  Neck: Normal range of motion. Neck supple. No JVD present. No thyromegaly present.  Cardiovascular: Normal rate, regular rhythm and normal heart sounds.   Pulmonary/Chest: Effort normal and breath sounds normal. No respiratory distress. She has no wheezes.  Abdominal: Soft. Bowel sounds are normal. She exhibits no distension and no mass. There is tenderness. There is no rebound and no guarding.       Mild epigastric tenderness without rebound or gaurding   Musculoskeletal: Normal range of motion. She exhibits no edema and no tenderness.  Lymphadenopathy:    She has no cervical adenopathy.  Neurological: She is alert. She has normal reflexes.  Skin: Skin is warm and dry. No rash noted. No erythema. No pallor.  Psychiatric: She has a normal mood and affect.          Assessment & Plan:

## 2012-02-19 NOTE — Assessment & Plan Note (Signed)
Worsening with stress and some wt gain- not controlled on zantac 150 bid Will add ppi- omeprazole 20 in am  Disc lifestyle/ stress red  Update if not starting to improve in a week or if worsening   F/u 4-6 wk

## 2012-02-19 NOTE — Patient Instructions (Signed)
Watch diet - avoid spicy food and caffeine and acidic food  Take the omeprazole 20 mg each am  Take a zantac at night if you need to  Follow up with me 4-6 weeks  Update if not starting to improve in a week or if worsening

## 2012-03-01 ENCOUNTER — Telehealth: Payer: Self-pay | Admitting: Family Medicine

## 2012-03-01 ENCOUNTER — Other Ambulatory Visit: Payer: Self-pay | Admitting: *Deleted

## 2012-03-01 MED ORDER — FLUTICASONE PROPIONATE 50 MCG/ACT NA SUSP: 2.0000 | Freq: Every day | NASAL | Status: DC

## 2012-03-01 MED ORDER — OMEPRAZOLE 20 MG PO CPDR: 20.0000 mg | DELAYED_RELEASE_CAPSULE | Freq: Two times a day (BID) | ORAL | Status: DC

## 2012-03-01 NOTE — Telephone Encounter (Signed)
Patient advised as instructed via telephone, she stated that the Rx for Omeprazole was covered under her insurance plan.  She is requesting a new Rx be sent to [facility].

## 2012-03-01 NOTE — Telephone Encounter (Signed)
Patient advised as instructed via telephone. 

## 2012-03-01 NOTE — Telephone Encounter (Signed)
Tell her to take another omeprazole (prilosec now ) - then increase to 1 in am and 1 in pm In meantime call insurance to see what PPI (proton pump inhibitors) are covered - she may need a different one and they can be expensive Let me know what is covered and will go from there

## 2012-03-01 NOTE — Telephone Encounter (Signed)
Caller: Christle/Patient; PCP: Roxy Manns A.;  Call regarding Taking Zantac BID and New Rx Med- Omeprazole 20 mgs 1 PO QD and Is still  Having Epigastric Burning; Seen in office on 02/19/12 - had alot isues with stomach over the weekend. She was very nauseous last night and had to sleep sitting up. No vomiting. Afebrile. Triage refused. She takes Miralax 2 capfuls daily to keep regular. Was wanting to know if there is another medication she can try or if Prilosec needs to be increased. PLEASE GIVE HER A CALL BACK AT CB#: [phone];

## 2012-03-01 NOTE — Telephone Encounter (Signed)
Ok- lets increase that to bid - let me know if no improvement in 3-4 days

## 2012-03-15 ENCOUNTER — Ambulatory Visit: Payer: BC Managed Care – PPO | Admitting: Family Medicine

## 2012-03-16 ENCOUNTER — Encounter: Payer: Self-pay | Admitting: Family Medicine

## 2012-03-16 ENCOUNTER — Ambulatory Visit (INDEPENDENT_AMBULATORY_CARE_PROVIDER_SITE_OTHER): Payer: BC Managed Care – PPO | Admitting: Family Medicine

## 2012-03-16 VITALS — BP 90/60 | HR 60 | Temp 97.9°F | Ht 62.25 in | Wt 162.2 lb

## 2012-03-16 DIAGNOSIS — K219 Gastro-esophageal reflux disease without esophagitis: Secondary | ICD-10-CM

## 2012-03-16 DIAGNOSIS — F438 Other reactions to severe stress: Secondary | ICD-10-CM

## 2012-03-16 DIAGNOSIS — F430 Acute stress reaction: Secondary | ICD-10-CM | POA: Insufficient documentation

## 2012-03-16 MED ORDER — ESOMEPRAZOLE MAGNESIUM 40 MG PO CPDR: 40.0000 mg | DELAYED_RELEASE_CAPSULE | Freq: Two times a day (BID) | ORAL | Status: DC

## 2012-03-16 NOTE — Patient Instructions (Addendum)
I'm glad you are improving  I want to change the omeprazole to nexium 40 mg twice daily  Keep working on diet change and exercise and weight loss Keep meditating and see your counselor  Follow up with me in 4-6 weeks

## 2012-03-16 NOTE — Assessment & Plan Note (Signed)
With dyspepsia that seems to be food and stress related- improved with diet and stress change and omeprazole bid  Not symptom free yet - and I do worry she may have had a peptic ulcer during the worst of it  Will adv to nexium 40mg  bid and update  Rev diet Rev self care and counseling plans Expect further imp If not will disc GI ref F/u 4-6 wk

## 2012-03-16 NOTE — Progress Notes (Signed)
Subjective:    Patient ID: Debra Edwards, female    DOB: 31-Mar-1976, 36 y.o.   MRN: [id number]  HPI Is here for f/u of GERD and dyspepsia  Her school year ended and immediately felt better when the stress got better  Even can eat [place] food now   Still occasionally some symptoms at night    Was working with a Engineer, civil (consulting) from her insurance co -- working through diet control and what sets her off  Flaming hot baked cheetos Tomato or orange based products  Lying down  Staying away from stress Cutting caffeine but not gone   (was having lot of caffeine and not enough sleep)  Learning not to eat at night   Is taking the omeprazole and that has helped - is on it bid  Was taking the zantac at night and now off of it too   When she has pain - raw/sore feeling in her epigastrium   Still has some symptoms at night / bedtime or any time she is reclining    Building in self care Meditation Seeing counselor soon  Much stress This makes her stomach act up a lot Overall improving  Does not feel she needs any medicine changes at this time   Patient Active Problem List  Diagnosis  . NEOPLASM, SKIN, UNCERTAIN BEHAVIOR  . UNSPECIFIED ANEMIA  . BRUXISM  . PTSD  . OBSTRUCTIVE SLEEP APNEA  . RESTLESS LEG SYNDROME  . ALLERGIC RHINITIS  . FIBROCYSTIC BREAST DISEASE  . DYSMENORRHEA  . HAND PAIN  . OTHER DYSPNEA AND RESPIRATORY ABNORMALITIES  . IBS (irritable bowel syndrome)  . Infectious disease exposure  . GERD (gastroesophageal reflux disease)  . Stress reaction   Past Medical History  Diagnosis Date  . PTSD (post-traumatic stress disorder)     with depressive symptoms  . Constipation, chronic   . Acid reflux     occasional  . Migraines   . 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 Heart murmur     Negative echo in the past  . Urticaria     stress induced  . History of chicken pox   . Anemia in the past    is a  blood donor  . Allergic rhinitis, seasonal     vasomotor rhinitis  . Dysmenorrhea   . OSA (obstructive sleep apnea)     PSG 08/20/10 RDI 18.7  . IBS (irritable bowel syndrome)    Past Surgical History  Procedure Date  . Wisdom tooth extraction    History  Substance Use Topics  . Smoking status: Never Smoker   . Smokeless tobacco: Never Used  . Alcohol Use: Yes     Rare   Family History  Problem Relation Age of Onset  . Hypertension Father   . Allergies Father   . Other Mother     Digestive problems-w/u @ [facility]-? bact inf  . Diabetes      GM  . Prostate cancer      GF  . Anxiety disorder      diffusely in family  . Depression      diffusely in family  . Colon cancer Neg Hx    No Known Allergies Current Outpatient Prescriptions on File Prior to Visit  Medication Sig Dispense Refill  . buPROPion (WELLBUTRIN XL) 300 MG 24 hr tablet Take 300 mg by mouth daily.        Marland Kitchen  Calcium Carb-Ergocalciferol (CHEWABLE CALCIUM/D) 300-100 MG-UNIT CHEW Chew 2 tablets by mouth daily.       . clonazePAM (KLONOPIN) 0.5 MG tablet Take 1 mg by mouth at bedtime.       . ferrous sulfate 325 (65 FE) MG tablet Take 325 mg by mouth as needed.       . fluticasone (FLONASE) 50 MCG/ACT nasal spray Place 2 sprays into the nose daily.  16 g  11  . ibuprofen (ADVIL,MOTRIN) 200 MG tablet Take 200 mg by mouth every 6 (six) hours as needed.        . meclizine (ANTIVERT) 25 MG tablet Take 25 mg by mouth 3 (three) times daily as needed.        . Melatonin 3 MG TABS Take 3-6 mg by mouth at bedtime as needed.        . Multiple Vitamin (MULTIVITAMIN) tablet Take 1 tablet by mouth daily.        . Polyethylene Glycol 3350 (MIRALAX PO) Take by mouth daily.        . ranitidine (ZANTAC) 150 MG capsule Take 150 mg by mouth daily. As needed      . esomeprazole (NEXIUM) 40 MG capsule Take 1 capsule (40 mg total) by mouth 2 (two) times daily.  60 capsule  5        Review of Systems Review of Systems    Constitutional: Negative for fever, appetite change, fatigue and unexpected weight change.  Eyes: Negative for pain and visual disturbance.  Respiratory: Negative for cough and shortness of breath.   Cardiovascular: Negative for cp or palpitations    Gastrointestinal: Negative for nausea, diarrhea and constipation. Pos for epigastric discomfort and also heartburn/ no regurgitation  Genitourinary: Negative for urgency and frequency.  Skin: Negative for pallor or rash   Neurological: Negative for weakness, light-headedness, numbness and headaches.  Hematological: Negative for adenopathy. Does not bruise/bleed easily.  Psychiatric/Behavioral: Negative for dysphoric mood. The patient is not nervous/anxious.         Objective:   Physical Exam  Constitutional: She appears well-developed and well-nourished. No distress.  HENT:  Head: Normocephalic and atraumatic.  Mouth/Throat: Oropharynx is clear and moist.  Eyes: Conjunctivae and EOM are normal. Pupils are equal, round, and reactive to light. No scleral icterus.  Neck: Normal range of motion. Neck supple. No JVD present. No thyromegaly present.  Cardiovascular: Normal rate, regular rhythm, normal heart sounds and intact distal pulses.  Exam reveals no gallop.   Pulmonary/Chest: Effort normal and breath sounds normal. No respiratory distress. She has no wheezes.  Abdominal: Soft. Bowel sounds are normal. She exhibits no distension and no mass. There is tenderness. There is no rebound and no guarding.       Mild epigastric tenderness to deep palp No RUQ tenderness , no murphy sign  Musculoskeletal: Normal range of motion. She exhibits no edema.  Lymphadenopathy:    She has no cervical adenopathy.  Neurological: She is alert. She has normal reflexes.  Skin: Skin is warm and dry. No rash noted. No erythema. No pallor.  Psychiatric: She has a normal mood and affect.       Admits to being stressed but affect is nl without signs of anxiety or  tearfulness           Assessment & Plan:

## 2012-03-18 ENCOUNTER — Ambulatory Visit: Payer: BC Managed Care – PPO | Admitting: Family Medicine

## 2012-03-19 NOTE — Assessment & Plan Note (Signed)
This is adding to her dyspepsia symptoms  Is seeing counselor and working on Engineer, civil (consulting)  Will f/u if she thinks she needs to discuss medication

## 2012-04-15 ENCOUNTER — Encounter: Payer: Self-pay | Admitting: Family Medicine

## 2012-04-15 ENCOUNTER — Ambulatory Visit (INDEPENDENT_AMBULATORY_CARE_PROVIDER_SITE_OTHER): Payer: BC Managed Care – PPO | Admitting: Family Medicine

## 2012-04-15 VITALS — BP 110/70 | HR 69 | Temp 98.1°F | Ht 63.0 in | Wt 161.8 lb

## 2012-04-15 DIAGNOSIS — K219 Gastro-esophageal reflux disease without esophagitis: Secondary | ICD-10-CM

## 2012-04-15 DIAGNOSIS — F430 Acute stress reaction: Secondary | ICD-10-CM

## 2012-04-15 DIAGNOSIS — F438 Other reactions to severe stress: Secondary | ICD-10-CM

## 2012-04-15 NOTE — Patient Instructions (Addendum)
Continue nexium and good lifestyle habits Exercise!! Continue counseling  Follow up with me in about 3 months

## 2012-04-15 NOTE — Assessment & Plan Note (Signed)
Improved with less stress/ more physical activity/ travel and counseling  Will continue to work on exercise and go to counseling

## 2012-04-15 NOTE — Assessment & Plan Note (Signed)
Improved with nexium 40 bid and good habits Continues to work on wt loss Disc diet F/u 3 mo

## 2012-04-15 NOTE — Progress Notes (Signed)
Subjective:    Patient ID: Debra Edwards, female    DOB: 30-Mar-1976, 36 y.o.   MRN: [id number]  HPI Here for f/u of GERD and stress rxn  Just got back from [employer]- great trip   GERD- a lot better  Inc to nexium 40 bid last time - thinks that did help- took a little while  Diet is avoiding onions , and figuring out what foods bother her  She gets worse if constipated - working on avoiding that  She raised head of bed  Does not eat late  At this point if she follows the lifestyle changes is symptom free   Wt is stable with bmi of 28  Stress - much better , being outdoors and on vacation also , feels good   Patient Active Problem List  Diagnosis  . NEOPLASM, SKIN, UNCERTAIN BEHAVIOR  . UNSPECIFIED ANEMIA  . BRUXISM  . PTSD  . OBSTRUCTIVE SLEEP APNEA  . RESTLESS LEG SYNDROME  . ALLERGIC RHINITIS  . FIBROCYSTIC BREAST DISEASE  . DYSMENORRHEA  . HAND PAIN  . OTHER DYSPNEA AND RESPIRATORY ABNORMALITIES  . IBS (irritable bowel syndrome)  . Infectious disease exposure  . GERD (gastroesophageal reflux disease)  . Stress reaction   Past Medical History  Diagnosis Date  . PTSD (post-traumatic stress disorder)     with depressive symptoms  . Constipation, chronic   . Acid reflux     occasional  . Migraines   . 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 Heart murmur     Negative echo in the past  . Urticaria     stress induced  . History of chicken pox   . Anemia in the past    is a blood donor  . Allergic rhinitis, seasonal     vasomotor rhinitis  . Dysmenorrhea   . OSA (obstructive sleep apnea)     PSG 08/20/10 RDI 18.7  . IBS (irritable bowel syndrome)    Past Surgical History  Procedure Date  . Wisdom tooth extraction    History  Substance Use Topics  . Smoking status: Never Smoker   . Smokeless tobacco: Never Used  . Alcohol Use: Yes     Rare   Family History  Problem Relation Age of Onset  .  Hypertension Father   . Allergies Father   . Other Mother     Digestive problems-w/u @ [facility]-? bact inf  . Diabetes      GM  . Prostate cancer      GF  . Anxiety disorder      diffusely in family  . Depression      diffusely in family  . Colon cancer Neg Hx    No Known Allergies Current Outpatient Prescriptions on File Prior to Visit  Medication Sig Dispense Refill  . buPROPion (WELLBUTRIN XL) 300 MG 24 hr tablet Take 300 mg by mouth daily.        . Calcium Carb-Ergocalciferol (CHEWABLE CALCIUM/D) 300-100 MG-UNIT CHEW Chew 2 tablets by mouth daily.       . clonazePAM (KLONOPIN) 0.5 MG tablet Take 1 mg by mouth at bedtime.       Marland Kitchen escitalopram (LEXAPRO) 20 MG tablet Take 20 mg by mouth daily.      Marland Kitchen esomeprazole (NEXIUM) 40 MG capsule Take 1 capsule (40 mg total) by mouth 2 (two) times daily.  60 capsule  5  .  ferrous sulfate 325 (65 FE) MG tablet Take 325 mg by mouth as needed.       . Melatonin 3 MG TABS Take 3-6 mg by mouth at bedtime as needed.        . Multiple Vitamin (MULTIVITAMIN) tablet Take 1 tablet by mouth daily.        . Polyethylene Glycol 3350 (MIRALAX PO) Take by mouth daily.        . ranitidine (ZANTAC) 150 MG capsule Take 150 mg by mouth daily. As needed      . fluticasone (FLONASE) 50 MCG/ACT nasal spray Place 2 sprays into the nose daily.  16 g  11  . meclizine (ANTIVERT) 25 MG tablet Take 25 mg by mouth 3 (three) times daily as needed.            Review of Systems Review of Systems  Constitutional: Negative for fever, appetite change, fatigue and unexpected weight change.  Eyes: Negative for pain and visual disturbance.  Respiratory: Negative for cough and shortness of breath.   Cardiovascular: Negative for cp or palpitations    Gastrointestinal: Negative for nausea, diarrhea and constipation.  Genitourinary: Negative for urgency and frequency.  Skin: Negative for pallor or rash   Neurological: Negative for weakness, light-headedness, numbness and  headaches.  Hematological: Negative for adenopathy. Does not bruise/bleed easily.  Psychiatric/Behavioral: Negative for dysphoric mood. The patient is anxious but this has improved         Objective:   Physical Exam  Constitutional: She appears well-developed and well-nourished. No distress.       overwt and well appearing   HENT:  Head: Normocephalic and atraumatic.  Mouth/Throat: Oropharynx is clear and moist.  Eyes: Conjunctivae and EOM are normal. Pupils are equal, round, and reactive to light. No scleral icterus.  Neck: Normal range of motion. Neck supple. No thyromegaly present.  Cardiovascular: Normal rate, regular rhythm and normal heart sounds.   Pulmonary/Chest: Effort normal and breath sounds normal. She has no wheezes.  Abdominal: Soft. Bowel sounds are normal. She exhibits no distension and no mass. There is no tenderness. There is no rebound and no guarding.       No longer has epigastric tenderness  Musculoskeletal: She exhibits no edema and no tenderness.  Lymphadenopathy:    She has no cervical adenopathy.  Neurological: She is alert. She has normal reflexes. No cranial nerve deficit. She exhibits normal muscle tone. Coordination normal.  Skin: Skin is warm and dry. No rash noted. No erythema. No pallor.  Psychiatric: She has a normal mood and affect.       Cheerful and talkative today, not seemingly stressed          Assessment & Plan:

## 2012-04-18 ENCOUNTER — Ambulatory Visit: Payer: BC Managed Care – PPO | Admitting: Family Medicine

## 2012-05-17 ENCOUNTER — Ambulatory Visit (INDEPENDENT_AMBULATORY_CARE_PROVIDER_SITE_OTHER): Payer: BC Managed Care – PPO | Admitting: Pulmonary Disease

## 2012-05-17 ENCOUNTER — Encounter: Payer: Self-pay | Admitting: Pulmonary Disease

## 2012-05-17 VITALS — BP 102/60 | HR 78 | Temp 98.0°F | Ht 63.0 in | Wt 156.2 lb

## 2012-05-17 DIAGNOSIS — G4733 Obstructive sleep apnea (adult) (pediatric): Secondary | ICD-10-CM

## 2012-05-17 NOTE — Progress Notes (Signed)
Chief Complaint  Patient presents with  . Follow-up    pt states she wears her cpap machine everynight x 4-5 hrs a night. Occaisonally f she wakes up in the middle of night she can't go back to sleep with the mask on. Denies any problems w/ mask/machine. She does feel rested during the day and can tell a difference since being on cpap    History of Present Illness: Debra Edwards is a 36 y.o. female with OSA.  She is doing well.  She uses CPAP at least 5 hours per night.  She is sleeping better, and feels this helps her energy level.  She does notice water build up in her mask at times.   Past Medical History  Diagnosis Date  . PTSD (post-traumatic stress disorder)     with depressive symptoms  . Constipation, chronic   . Acid reflux     occasional  . Migraines   . 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 Heart murmur     Negative echo in the past  . Urticaria     stress induced  . History of chicken pox   . Anemia in the past    is a blood donor  . Allergic rhinitis, seasonal     vasomotor rhinitis  . Dysmenorrhea   . OSA (obstructive sleep apnea)     PSG 08/20/10 RDI 18.7  . IBS (irritable bowel syndrome)     Past Surgical History  Procedure Date  . Wisdom tooth extraction     Outpatient Encounter Prescriptions as of 05/17/2012  Medication Sig Dispense Refill  . buPROPion (WELLBUTRIN XL) 300 MG 24 hr tablet Take 300 mg by mouth daily.        . clonazePAM (KLONOPIN) 0.5 MG tablet Take 1 mg by mouth at bedtime.       Marland Kitchen escitalopram (LEXAPRO) 20 MG tablet Take 20 mg by mouth daily.      Marland Kitchen esomeprazole (NEXIUM) 40 MG capsule Take 1 capsule (40 mg total) by mouth 2 (two) times daily.  60 capsule  5  . ferrous sulfate 325 (65 FE) MG tablet Take 325 mg by mouth as needed.       . fluticasone (FLONASE) 50 MCG/ACT nasal spray Place 2 sprays into the nose daily.  16 g  11  . Melatonin 3 MG TABS Take 3-6 mg by mouth  at bedtime as needed.        . Multiple Vitamin (MULTIVITAMIN) tablet Take 1 tablet by mouth daily.        . Polyethylene Glycol 3350 (MIRALAX PO) Take by mouth daily.        . ranitidine (ZANTAC) 150 MG capsule Take 150 mg by mouth daily. As needed      . DISCONTD: Calcium Carb-Ergocalciferol (CHEWABLE CALCIUM/D) 300-100 MG-UNIT CHEW Chew 2 tablets by mouth daily.       Marland Kitchen DISCONTD: meclizine (ANTIVERT) 25 MG tablet Take 25 mg by mouth 3 (three) times daily as needed.          No Known Allergies  Physical Exam:  Filed Vitals:   05/17/12 1407  BP: 102/60  Pulse: 78  Temp: 98 F (36.7 C)  TempSrc: Oral  Height: 5\' 3"  (1.6 m)  Weight: 156 lb 3.2 oz (70.852 kg)  SpO2: 96%    Current Encounter SPO2  05/17/12 1407 96%  05/08/11 1506 96%  11/24/10 1417 99%  Body mass index is 27.67 kg/(m^2). Wt Readings from Last 2 Encounters:  05/17/12 156 lb 3.2 oz (70.852 kg)  04/15/12 161 lb 12 oz (73.369 kg)    General - No distress ENT - TM clear, no sinus tenderness, no oral exudate, no LAN, no thyromegaly Cardiac - s1s2 regular, no murmur, pulses symmetric, no edema Chest - normal respiratory excursion, good air entry, no wheeze/rales/dullness Back - no focal tenderness Abd - soft, non-tender, no organomegaly, + bowel sounds Ext - normal motor strength Neuro - Cranial nerves are normal. PERLA. EOM's intact. Skin - no discernible active dermatitis, erythema, urticaria or inflammatory process. Psych - normal mood, and behavior.   Assessment/Plan:  Debra Helling, MD  Pulmonary/Critical Care/Sleep Pager:  [phone] 05/17/2012, 2:25 PM

## 2012-05-17 NOTE — Patient Instructions (Signed)
Follow up in 1 year.

## 2012-05-17 NOTE — Assessment & Plan Note (Signed)
She is doing well with CPAP.  Reports benefit and compliance to therapy.    Discussed how to avoid development of rain out with her mask.  She has recent weight loss.  I advised she could try changing her CPAP settings, but she did not feel this was needed at present.

## 2012-06-14 ENCOUNTER — Telehealth: Payer: Self-pay

## 2012-06-14 NOTE — Telephone Encounter (Signed)
Prior auth needed for Nexium; approval given 05/24/12 thru 06/14/13 by Dominique at [phone] case # [id number]. Approval letter to follow. Denise at [employer] said rx went thru. Patient notified as instructed by telephone to use Mannie Stabile as name at her pharmacy.

## 2012-06-20 ENCOUNTER — Telehealth: Payer: Self-pay | Admitting: Family Medicine

## 2012-06-20 ENCOUNTER — Emergency Department (HOSPITAL_COMMUNITY)
Admission: EM | Admit: 2012-06-20 | Discharge: 2012-06-20 | Disposition: A | Discharge status: Home / Self Care | Payer: BC Managed Care – PPO | Attending: Emergency Medicine | Admitting: Emergency Medicine

## 2012-06-20 ENCOUNTER — Encounter (HOSPITAL_COMMUNITY): Payer: Self-pay | Admitting: Emergency Medicine

## 2012-06-20 DIAGNOSIS — R51 Headache: Secondary | ICD-10-CM | POA: Insufficient documentation

## 2012-06-20 DIAGNOSIS — G43909 Migraine, unspecified, not intractable, without status migrainosus: Secondary | ICD-10-CM

## 2012-06-20 DIAGNOSIS — G4733 Obstructive sleep apnea (adult) (pediatric): Secondary | ICD-10-CM | POA: Insufficient documentation

## 2012-06-20 DIAGNOSIS — K219 Gastro-esophageal reflux disease without esophagitis: Secondary | ICD-10-CM | POA: Insufficient documentation

## 2012-06-20 DIAGNOSIS — D649 Anemia, unspecified: Secondary | ICD-10-CM | POA: Insufficient documentation

## 2012-06-20 DIAGNOSIS — F4310 Post-traumatic stress disorder, unspecified: Secondary | ICD-10-CM | POA: Insufficient documentation

## 2012-06-20 LAB — CBC WITH DIFFERENTIAL/PLATELET
Eosinophils Absolute: 0.1 10*3/uL (ref 0.0–0.7)
Eosinophils Relative: 2 % (ref 0–5)
HCT: 38.3 % (ref 36.0–46.0)
Hemoglobin: 13.1 g/dL (ref 12.0–15.0)
Lymphocytes Relative: 26 % (ref 12–46)
Lymphs Abs: 1.7 10*3/uL (ref 0.7–4.0)
MCH: 30.4 pg (ref 26.0–34.0)
MCV: 88.9 fL (ref 78.0–100.0)
Monocytes Absolute: 0.4 10*3/uL (ref 0.1–1.0)
Monocytes Relative: 7 % (ref 3–12)
RBC: 4.31 MIL/uL (ref 3.87–5.11)
WBC: 6.5 10*3/uL (ref 4.0–10.5)

## 2012-06-20 MED ORDER — KETOROLAC TROMETHAMINE 30 MG/ML IJ SOLN
30.0000 mg | Freq: Once | INTRAMUSCULAR | Status: AC
  Administered 2012-06-20: 30 mg via INTRAVENOUS
  Filled 2012-06-20: qty 1

## 2012-06-20 MED ORDER — METOCLOPRAMIDE HCL 5 MG/ML IJ SOLN
10.0000 mg | Freq: Once | INTRAMUSCULAR | Status: AC
  Administered 2012-06-20: 10 mg via INTRAVENOUS
  Filled 2012-06-20: qty 2

## 2012-06-20 MED ORDER — SODIUM CHLORIDE 0.9 % IV BOLUS (SEPSIS)
1000.0000 mL | Freq: Once | INTRAVENOUS | Status: AC
  Administered 2012-06-20: 1000 mL via INTRAVENOUS

## 2012-06-20 NOTE — Telephone Encounter (Signed)
Caller: Analis/Patient; Patient Name: Debra Edwards; PCP: Roxy Manns [facility]); Best Callback Phone Number: [phone].  2 weeks ago she has her period then  2-3 days later she got a migraine and 06/17/12 it got worse.  She was in bed all weekend 9/14-15-2013.   She still continures to have a headache. Triaged Headache and she states she fell and hit her head at a water park the middle of 05/2012 and did not have it evaluated.  Needs to be seen in the emergency room for sudden loss or change in vision... increased light sensitivity AND not previously evaluated.  She is by herself and will call a family member to go to [facility].  Travel care and calling 911 instructions given.

## 2012-06-20 NOTE — ED Notes (Signed)
Pt c/o generalized HA x 1 month that is like typical migraine that is not getting better; pt sts hx of hitting head in August at water park without LOC

## 2012-06-20 NOTE — ED Notes (Signed)
Pt co headache that feels like migraine but has lasted over 1 month.  Pt reports hitting head in aug at the [place].  Pt states she has nausea but denies vomiting. Pt states light and sound make headache worse.  Pt has possible ulcer and is concerned that meds will make that hurt.  Pt has tried tylenol but has little effect.  Pt alert oriented X4

## 2012-06-20 NOTE — ED Provider Notes (Signed)
History     CSN: [id number]  Arrival date & time 06/20/12  1013   First MD Initiated Contact with Patient 06/20/12 1325      Chief Complaint  Patient presents with  . Headache    (Consider location/radiation/quality/duration/timing/severity/associated sxs/prior treatment) HPI Pt with history of migraines reports severe throbbing posterior and frontal headache associated with nausea and photophobia similar to prior migraines but ongoing for about 10-14 days, began following a particularly heavy menstrual period. Symptoms improved some last week but worsened again 2 days ago. She had a remote minor head injury about a month ago but was asymptomatic for a couple of weeks prior to this headache starting. She typically manages her headaches as an outpatient with Motrin but she has had some concern recently for gastric ulcer and so is no longer taking NSAIDs on a regular basis.   Past Medical History  Diagnosis Date  . PTSD (post-traumatic stress disorder)     with depressive symptoms  . Constipation, chronic   . Acid reflux     occasional  . Migraines   . 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 Heart murmur     Negative echo in the past  . Urticaria     stress induced  . History of chicken pox   . Anemia in the past    is a blood donor  . Allergic rhinitis, seasonal     vasomotor rhinitis  . Dysmenorrhea   . OSA (obstructive sleep apnea)     PSG 08/20/10 RDI 18.7  . IBS (irritable bowel syndrome)     Past Surgical History  Procedure Date  . Wisdom tooth extraction     Family History  Problem Relation Age of Onset  . Hypertension Father   . Allergies Father   . Other Mother     Digestive problems-w/u @ [facility]-? bact inf  . Diabetes      GM  . Prostate cancer      GF  . Anxiety disorder      diffusely in family  . Depression      diffusely in family  . Colon cancer Neg Hx     History  Substance Use  Topics  . Smoking status: Never Smoker   . Smokeless tobacco: Never Used  . Alcohol Use: Yes     Rare    OB History    Grav Para Term Preterm Abortions TAB SAB Ect Mult Living                  Review of Systems All other systems reviewed and are negative except as noted in HPI.   Allergies  Review of patient's allergies indicates no known allergies.  Home Medications   Current Outpatient Rx  Name Route Sig Dispense Refill  . ACETAMINOPHEN 500 MG PO TABS Oral Take 500 mg by mouth every 6 (six) hours as needed. For headache    . BUPROPION HCL ER (XL) 300 MG PO TB24 Oral Take 300 mg by mouth daily.      Marland Kitchen CLONAZEPAM 0.5 MG PO TABS Oral Take 0.5 mg by mouth at bedtime.     Marland Kitchen ESCITALOPRAM OXALATE 20 MG PO TABS Oral Take 20 mg by mouth daily.    Marland Kitchen ESOMEPRAZOLE MAGNESIUM 40 MG PO CPDR Oral Take 1 capsule (40 mg total) by mouth 2 (two) times daily. 60 capsule 5  . FERROUS SULFATE 325 (  65 FE) MG PO TABS Oral Take 325 mg by mouth daily.     Marland Kitchen FLUTICASONE PROPIONATE 50 MCG/ACT NA SUSP Nasal Place 2 sprays into the nose daily. 16 g 11  . IBUPROFEN 200 MG PO TABS Oral Take 400 mg by mouth every 6 (six) hours as needed. For pain    . MELATONIN 3 MG PO TABS Oral Take 3-6 mg by mouth at bedtime as needed. For sleep    . ONE-DAILY MULTI VITAMINS PO TABS Oral Take 1 tablet by mouth daily.      Marland Kitchen MIRALAX PO Oral Take by mouth daily.        BP 119/75  Pulse 83  Temp 98.7 F (37.1 C) (Oral)  Resp 18  SpO2 100%  Physical Exam  Nursing note and vitals reviewed. Constitutional: She is oriented to person, place, and time. She appears well-developed and well-nourished.  HENT:  Head: Normocephalic and atraumatic.  Eyes: EOM are normal. Pupils are equal, round, and reactive to light.  Neck: Normal range of motion. Neck supple.  Cardiovascular: Normal rate, normal heart sounds and intact distal pulses.   Pulmonary/Chest: Effort normal and breath sounds normal.  Abdominal: Bowel sounds are  normal. She exhibits no distension. There is no tenderness.  Musculoskeletal: Normal range of motion. She exhibits no edema and no tenderness.  Neurological: She is alert and oriented to person, place, and time. She has normal strength. No cranial nerve deficit or sensory deficit.  Skin: Skin is warm and dry. No rash noted.  Psychiatric: She has a normal mood and affect.    ED Course  Procedures (including critical care time)   Labs Reviewed  CBC WITH DIFFERENTIAL   No results found.   No diagnosis found.    MDM  Pt reports resolution of her headache and ready to go home. Advised PCP followup.         Bernarr Longsworth B. Bernette Mayers, MD 06/20/12 1527

## 2012-06-20 NOTE — Telephone Encounter (Signed)
Aware and will watch for ER records Agree with ref to ER - will likely need imaging in light of her prev trauma

## 2012-06-22 ENCOUNTER — Telehealth: Payer: Self-pay | Admitting: Family Medicine

## 2012-06-22 NOTE — Telephone Encounter (Signed)
Please schedule a visit (preferably 30 min if possible)- to disc headaches and treatment options -- headaches are a very complex thing to treat and I generally take a long history , have to disc med side effects, etc

## 2012-06-22 NOTE — Telephone Encounter (Signed)
Caller: Shanina/Patient; Phone: [phone]; Reason for Call: Patient calling to speak with Dr.  Milinda Antis regarding her migraine headaches.  She would like to see what medications you would suggest and if she could get a prescriptions.  States she was seen in the ED on Monday and was given Toradol 30mg  & Reglan 10mg .  Patient states the medications seem to help at the time.  Please call her back.  Thanks

## 2012-06-22 NOTE — Telephone Encounter (Signed)
Left message for pt to call the office tomorrow, i will try to call her back

## 2012-06-23 NOTE — Telephone Encounter (Signed)
Pt made appt for tomorrow 06/24/12 @ 3:45, 30 min appt to discuss her migraines

## 2012-06-24 ENCOUNTER — Ambulatory Visit (INDEPENDENT_AMBULATORY_CARE_PROVIDER_SITE_OTHER): Payer: BC Managed Care – PPO | Admitting: Family Medicine

## 2012-06-24 ENCOUNTER — Encounter: Payer: Self-pay | Admitting: Family Medicine

## 2012-06-24 VITALS — BP 110/68 | HR 78 | Temp 98.0°F | Ht 63.0 in | Wt 155.8 lb

## 2012-06-24 DIAGNOSIS — J309 Allergic rhinitis, unspecified: Secondary | ICD-10-CM

## 2012-06-24 DIAGNOSIS — G43909 Migraine, unspecified, not intractable, without status migrainosus: Secondary | ICD-10-CM

## 2012-06-24 MED ORDER — CYCLOBENZAPRINE HCL 10 MG PO TABS: 10.0000 mg | ORAL_TABLET | Freq: Three times a day (TID) | ORAL | Status: DC | PRN

## 2012-06-24 MED ORDER — ELETRIPTAN HYDROBROMIDE 40 MG PO TABS: 40.0000 mg | ORAL_TABLET | ORAL | Status: DC | PRN

## 2012-06-24 MED ORDER — MONTELUKAST SODIUM 10 MG PO TABS: 10.0000 mg | ORAL_TABLET | Freq: Every day | ORAL | Status: DC

## 2012-06-24 NOTE — Progress Notes (Signed)
Subjective:    Patient ID: Debra Edwards, female    DOB: 1976/02/09, 36 y.o.   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, allergic rhinitis, OSA, Bruxism, and RLS, and IBS Presents with headache history  Had a bad prolonged headache earlier this past weekend and early this week So bad she had to go to the ER - toradol and reglan Gave her med that helped  Then it came back  Broke down last Sunday and took 400 mg of ibuprofen - it did burn her stomach a bit - but helped with the headache   In past - tried migraine med (imitrex)- did not help much more , maxalt made her too drugged - also with rebound, and tried relpax also once and that helped more   Cannot take nsaids due to stomach upset - in big doses   Lately headaches were worse than usual  Did hit her head in a water park 2 weeks ago - that concerned the nurse  Head hurt for days after that    Never saw a neurologist in the past  Has had headaches since college, possibly as a child   In general (with exceptions) will get headaches in cycles , hard to get rid of - lasting 3-4 days , then very sensitive to them for another week  No correlation with menses until this past time (this one was in 3rd day)  They start in the back of the head and then move to one or both sides , throbbing intense - will wrap around to front of her head sensative scalp with it  Very sensitive to light and sound Worsened by exertion  Will get nauseated- never vomited  Perhaps gets a bad headache every 3 months    None of her medicines are new  Started eating better- fresh and raw- this summer , now uses nutrabullet  Lots of fruit and dark green vegetables Protein powder  Happy with that   Is exercising - circuit training on Thursday nights  Yoga on wed nights   Sleep at night - is good , uses cpap and also melatonin  occ  anxiety attack - but generally does ok  occ klonopin at night  Chamomile tea helps  Is trying to hydrate well - but occas not as good  No caffeine  Headache triggers include stress or sleep changes or poor diet  Has been under a lot of pressure but is enjoying work also  Mood is general better   Does have allergic rhinitis- 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 NEOPLASM, SKIN, UNCERTAIN BEHAVIOR  . UNSPECIFIED ANEMIA  . BRUXISM  . PTSD  . OBSTRUCTIVE SLEEP APNEA  . RESTLESS LEG SYNDROME  . ALLERGIC RHINITIS  . FIBROCYSTIC BREAST DISEASE  . DYSMENORRHEA  . HAND PAIN  . OTHER DYSPNEA AND RESPIRATORY ABNORMALITIES  . IBS (irritable bowel syndrome)  . Infectious disease exposure  . GERD (gastroesophageal reflux disease)  . Stress reaction   Past Medical History  Diagnosis Date  . PTSD (post-traumatic stress disorder)     with depressive symptoms  . Constipation, chronic   . Acid reflux     occasional  . Migraines   . 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 Heart murmur     Negative echo in the past  . Urticaria     stress induced  . History of chicken pox   . Anemia in the past    is a blood donor  . Allergic rhinitis, seasonal     vasomotor rhinitis  . Dysmenorrhea   . OSA (obstructive sleep apnea)     PSG 08/20/10 RDI 18.7  . IBS (irritable bowel syndrome)    Past Surgical History  Procedure Date  . Wisdom tooth extraction    History  Substance Use Topics  . Smoking status: Never Smoker   . Smokeless tobacco: Never Used  . Alcohol Use: Yes     Rare   Family History  Problem Relation Age of Onset  . Hypertension Father   . Allergies Father   . Other Mother     Digestive problems-w/u @ [facility]-? bact inf  . Diabetes      GM  . Prostate cancer      GF  . Anxiety disorder      diffusely in family  . Depression      diffusely in  family  . Colon cancer Neg Hx    No Known Allergies Current Outpatient Prescriptions on File Prior to Visit  Medication Sig Dispense Refill  . acetaminophen (TYLENOL) 500 MG tablet Take 500 mg by mouth every 6 (six) hours as needed. For headache      . buPROPion (WELLBUTRIN XL) 300 MG 24 hr tablet Take 300 mg by mouth daily.        . clonazePAM (KLONOPIN) 0.5 MG tablet Take 0.5 mg by mouth at bedtime.       Marland Kitchen escitalopram (LEXAPRO) 20 MG tablet Take 20 mg by mouth daily.      Marland Kitchen esomeprazole (NEXIUM) 40 MG capsule Take 1 capsule (40 mg total) by mouth 2 (two) times daily.  60 capsule  5  . fluticasone (FLONASE) 50 MCG/ACT nasal spray Place 2 sprays into the nose daily.  16 g  11  . Melatonin 3 MG TABS Take 3-6 mg by mouth at bedtime as needed. For sleep      . Multiple Vitamin (MULTIVITAMIN) tablet Take 1 tablet by mouth daily.        . Polyethylene Glycol 3350 (MIRALAX PO) Take by mouth daily.       . ferrous sulfate 325 (65 FE) MG tablet Take 325 mg by mouth daily.             Review of Systems Review of Systems  Constitutional: Negative for fever, appetite change,  and unexpected weight change.  Eyes: Negative for pain and visual disturbance.  Respiratory: Negative for cough and shortness of breath.   Cardiovascular: Negative for cp or palpitations    Gastrointestinal: Negative for  diarrhea and constipation.  Genitourinary: Negative for urgency and frequency.  Skin: Negative for pallor or rash   Neurological: Negative for weakness, light-headedness, numbness and pos for headache.  Hematological: Negative for adenopathy. Does not bruise/bleed easily.  Psychiatric/Behavioral: Negative for dysphoric mood. The patient is not nervous/anxious.         Objective:   Physical Exam  Constitutional: She appears well-developed and well-nourished. No distress.  HENT:  Head: Normocephalic and atraumatic.  Mouth/Throat: Oropharynx is clear and moist.  Eyes: Conjunctivae normal and EOM  are normal. Pupils are equal, round, and reactive to light. No scleral icterus.       Fundi  grossly within normal limits  Neck: Normal range of motion. Neck supple. No JVD present. No thyromegaly present.  Cardiovascular: Normal rate, regular rhythm, normal heart sounds and intact distal pulses.  Exam reveals no gallop.   No murmur heard. Pulmonary/Chest: Breath sounds normal. No respiratory distress. She has no wheezes.  Abdominal: Bowel sounds are normal. She exhibits no distension and no mass. There is no tenderness.  Musculoskeletal: She exhibits no edema and no tenderness.  Lymphadenopathy:    She has no cervical adenopathy.  Neurological: She is alert. She has normal reflexes. She displays no atrophy and no tremor. No cranial nerve deficit or sensory deficit. She exhibits normal muscle tone. She displays a negative Romberg sign. Coordination and gait normal.       No facial tenderness  Skin: Skin is warm and dry. No rash noted. No erythema. No pallor.  Psychiatric: She has a normal mood and affect.          Assessment & Plan:

## 2012-06-24 NOTE — Patient Instructions (Addendum)
Keep up good lifestyle habits  Stay hydrated/ avoid caffeine and keep sleep time the same every day Try singulare for allergies  For migraine- try the relpax and see how you do with it (alert me with side effects)  Flexeril is a muscle relaxer you can take when you are home during headache or tight neck muscles  Follow up if you get a severe headache that will not go away

## 2012-06-24 NOTE — Assessment & Plan Note (Signed)
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

## 2012-07-20 ENCOUNTER — Encounter: Payer: Self-pay | Admitting: Family Medicine

## 2012-07-20 ENCOUNTER — Ambulatory Visit (INDEPENDENT_AMBULATORY_CARE_PROVIDER_SITE_OTHER): Payer: BC Managed Care – PPO | Admitting: Family Medicine

## 2012-07-20 VITALS — BP 118/72 | HR 78 | Temp 98.6°F | Ht 63.0 in | Wt 154.2 lb

## 2012-07-20 DIAGNOSIS — K219 Gastro-esophageal reflux disease without esophagitis: Secondary | ICD-10-CM

## 2012-07-20 DIAGNOSIS — G43909 Migraine, unspecified, not intractable, without status migrainosus: Secondary | ICD-10-CM

## 2012-07-20 NOTE — Progress Notes (Signed)
Subjective:    Patient ID: Debra Edwards, female    DOB: [birth year], 36 y.o.   MRN: [id number]  HPI Last visit had long disc with pt re: ha/ migraine  Thought to be multi factorial- muscle spasm and allergies as well   Has been quite a bit better  The flexeril really helps spasm in neck - and prevents headache - she used that the last several days  relpax did abort one headache -only had to use it once (dislikes side effects but it works) Makes her feel tired and draggy- but not nearly as bad as maxalt and imitrex   Is exercising    Tried the singulair for allergies - made her too tired   Her stomach is doing a lot better over all - thankful for that  No longer has daily heartburn issues  Cannot eat and lie down  No more knawing  Is on nexium  Patient Active Problem List  Diagnosis  . NEOPLASM, SKIN, UNCERTAIN BEHAVIOR  . UNSPECIFIED ANEMIA  . BRUXISM  . PTSD  . OBSTRUCTIVE SLEEP APNEA  . RESTLESS LEG SYNDROME  . ALLERGIC RHINITIS  . FIBROCYSTIC BREAST DISEASE  . DYSMENORRHEA  . HAND PAIN  . OTHER DYSPNEA AND RESPIRATORY ABNORMALITIES  . IBS (irritable bowel syndrome)  . Infectious disease exposure  . GERD (gastroesophageal reflux disease)  . Stress reaction  . Migraine headache   Past Medical History  Diagnosis Date  . PTSD (post-traumatic stress disorder)     with depressive symptoms  . Constipation, chronic   . Acid reflux     occasional  . Migraines   . 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 Heart murmur     Negative echo in the past  . Urticaria     stress induced  . History of chicken pox   . Anemia in the past    is a blood donor  . Allergic rhinitis, seasonal     vasomotor rhinitis  . Dysmenorrhea   . OSA (obstructive sleep apnea)     PSG 08/20/10 RDI 18.7  . IBS (irritable bowel syndrome)    Past Surgical History  Procedure Date  . Wisdom tooth extraction    History    Substance Use Topics  . Smoking status: Never Smoker   . Smokeless tobacco: Never Used  . Alcohol Use: Yes     Rare   Family History  Problem Relation Age of Onset  . Hypertension Father   . Allergies Father   . Other Mother     Digestive problems-w/u @ [facility]-? bact inf  . Diabetes      GM  . Prostate cancer      GF  . Anxiety disorder      diffusely in family  . Depression      diffusely in family  . Colon cancer Neg Hx    No Known Allergies Current Outpatient Prescriptions on File Prior to Visit  Medication Sig Dispense Refill  . acetaminophen (TYLENOL) 500 MG tablet Take 500 mg by mouth every 6 (six) hours as needed. For headache      . buPROPion (WELLBUTRIN XL) 300 MG 24 hr tablet Take 300 mg by mouth daily.        . clonazePAM (KLONOPIN) 0.5 MG tablet Take 0.5 mg by mouth at bedtime.       . cyclobenzaprine (FLEXERIL) 10 MG tablet Take 1 tablet (  10 mg total) by mouth 3 (three) times daily as needed for muscle spasms (and headache).  30 tablet  3  . eletriptan (RELPAX) 40 MG tablet One tablet by mouth at onset of headache. May repeat in 2 hours if headache persists or recurs. may repeat in 2 hours if necessary  10 tablet  0  . escitalopram (LEXAPRO) 20 MG tablet Take 20 mg by mouth daily.      Marland Kitchen esomeprazole (NEXIUM) 40 MG capsule Take 1 capsule (40 mg total) by mouth 2 (two) times daily.  60 capsule  5  . fluticasone (FLONASE) 50 MCG/ACT nasal spray Place 2 sprays into the nose daily.  16 g  11  . Melatonin 3 MG TABS Take 3-6 mg by mouth at bedtime as needed. For sleep      . Multiple Vitamin (MULTIVITAMIN) tablet Take 1 tablet by mouth daily.        . Polyethylene Glycol 3350 (MIRALAX PO) Take by mouth daily.          Review of Systems Review of Systems  Constitutional: Negative for fever, appetite change, fatigue and unexpected weight change.  Eyes: Negative for pain and visual disturbance.  Respiratory: Negative for cough and shortness of breath.    Cardiovascular: Negative for cp or palpitations    Gastrointestinal: Negative for nausea, diarrhea and constipation. stomach pain is better Genitourinary: Negative for urgency and frequency.  Skin: Negative for pallor or rash   Neurological: Negative for weakness, light-headedness, numbness and headaches that are improved  Hematological: Negative for adenopathy. Does not bruise/bleed easily.  Psychiatric/Behavioral: Negative for dysphoric mood. The patient is not nervous/anxious.         Objective:   Physical Exam  Constitutional: She appears well-developed and well-nourished. No distress.  HENT:  Head: Normocephalic and atraumatic.  Mouth/Throat: Oropharynx is clear and moist.  Eyes: Conjunctivae normal and EOM are normal. Pupils are equal, round, and reactive to light. No scleral icterus.  Neck: Normal range of motion. Neck supple. No JVD present. No thyromegaly present.       Cervical muscles are generally tight with some spasm on the R   Cardiovascular: Normal rate, regular rhythm, normal heart sounds and intact distal pulses.  Exam reveals no gallop.   Pulmonary/Chest: Effort normal and breath sounds normal. No respiratory distress. She has no wheezes.  Abdominal: Soft. Bowel sounds are normal. She exhibits no distension and no mass. There is no tenderness.  Musculoskeletal: She exhibits no edema.  Lymphadenopathy:    She has no cervical adenopathy.  Neurological: She is alert. She has normal reflexes. No cranial nerve deficit. She exhibits normal muscle tone. Coordination normal.  Skin: Skin is warm and dry. No rash noted. No erythema. No pallor.  Psychiatric: She has a normal mood and affect.          Assessment & Plan:

## 2012-07-20 NOTE — Patient Instructions (Addendum)
Continue current medicines  Cut back on nexium from twice daily to once daily now , watching diet and not eating before lying down  If doing well - call in January about changing to a cheaper med  For headache- stay on the same protocol and try heat on the back of your neck

## 2012-07-20 NOTE — Assessment & Plan Note (Signed)
poteniated by stress/ neck muscle spasm (imp with flexeril) , also weather change  Flexeril has been helpful in preventing transformation of neck spasm into headache  Also stretching / exercise  The relpax works for acute migraine and has only needed once Disc other strategies for neck spasm- like heat , she may also consider chiropractor  Will update

## 2012-07-20 NOTE — Assessment & Plan Note (Signed)
Much better now  Will cut back nexium to 40 qd from bid  If ok - in jan will try changing to better covered med Disc diet for gerd and avoidance of late night eating

## 2012-07-22 ENCOUNTER — Ambulatory Visit (INDEPENDENT_AMBULATORY_CARE_PROVIDER_SITE_OTHER): Payer: BC Managed Care – PPO | Admitting: Family Medicine

## 2012-07-22 ENCOUNTER — Encounter: Payer: Self-pay | Admitting: Family Medicine

## 2012-07-22 ENCOUNTER — Telehealth: Payer: Self-pay | Admitting: Family Medicine

## 2012-07-22 VITALS — BP 100/68 | HR 78 | Temp 98.4°F | Ht 63.0 in | Wt 153.5 lb

## 2012-07-22 DIAGNOSIS — G43909 Migraine, unspecified, not intractable, without status migrainosus: Secondary | ICD-10-CM

## 2012-07-22 MED ORDER — PROMETHAZINE HCL 25 MG/ML IJ SOLN
50.0000 mg | Freq: Once | INTRAMUSCULAR | Status: AC
  Administered 2012-07-22: 50 mg via INTRAVENOUS

## 2012-07-22 MED ORDER — KETOROLAC TROMETHAMINE 60 MG/2ML IM SOLN
60.0000 mg | Freq: Once | INTRAMUSCULAR | Status: AC
  Administered 2012-07-22: 60 mg via INTRAMUSCULAR

## 2012-07-22 NOTE — Telephone Encounter (Signed)
Caller: Eupha/Patient; Patient Name: Debra Edwards; PCP: Roxy Manns [facility]); Best Callback Phone Number: [phone]. Caller reports she was seen in the office on Wed 10/16 for follow-up to some GI issues and migraine headaches. She was given Rx for Relpax which she has already taken max dose in past 24 hours. She took one dose at 8pm on Thurs 10/17 and 2nd dose at 2am along with Flexaril with each dose. Caller reports headache was improved and she was able to sleep. She is now awake for the day and has headache with pain reported at 6/10 on pain scale. Caller reports PCP told her to call if medication did not stop headache and she could get meds from office instead of going to the ED. Per Headache Protocol, New onset of severe headache unrelieved with 4 hours of home care, See Provider within 4 hours. No open appointments in [facility]. Caller advised a note will be sent for a callback from office staff re this issue. She is agreeable.  PLEASE CALL Zandrea WITH DIRECTION RE MIGRAINE HEADACHE.

## 2012-07-22 NOTE — Telephone Encounter (Signed)
Put her in this afternoon and lets give her a toradol and phenergan shot -- can call it a 4:15 -- but if she wants to come earlier, I will briefly check in with her and then can just get her the shots to break the cycle

## 2012-07-22 NOTE — Telephone Encounter (Signed)
Pt said she has taken 1 tab. of ibuprofen but she didn't want to take 2 tab. because of her stomach problems but she said it did help a little but migraine still present. Please advise

## 2012-07-22 NOTE — Telephone Encounter (Signed)
Pt scheduled for the 4:15 appt and notified

## 2012-07-22 NOTE — Telephone Encounter (Signed)
Please ask her if she has taken any anti inflammatories for this headache ? -- ibuprofen or aleve or other

## 2012-07-24 NOTE — Assessment & Plan Note (Signed)
Phenergan and toradol today to break headache cycle Improved with relpax but not able to stop headache so far  Will update on Monday if not imp

## 2012-07-24 NOTE — Progress Notes (Signed)
Subjective:    Patient ID: Debra Edwards, female    DOB: 07/31/1976, 36 y.o.   MRN: [id number]  HPI Pt here today for a headache cycle she cannot break  Started several days ago with neck tightness/ muscle spasm  Then got worse - took 2 relpax with improvement but not total relief Afraid to take oral nsaids due to recent gastritis Pain today is 6/10 No other symptoms Told to come in for toradol/ phenergan injections that usually break the cycle  Patient Active Problem List  Diagnosis  . NEOPLASM, SKIN, UNCERTAIN BEHAVIOR  . UNSPECIFIED ANEMIA  . BRUXISM  . PTSD  . OBSTRUCTIVE SLEEP APNEA  . RESTLESS LEG SYNDROME  . ALLERGIC RHINITIS  . FIBROCYSTIC BREAST DISEASE  . DYSMENORRHEA  . HAND PAIN  . OTHER DYSPNEA AND RESPIRATORY ABNORMALITIES  . IBS (irritable bowel syndrome)  . Infectious disease exposure  . GERD (gastroesophageal reflux disease)  . Stress reaction  . Migraine headache   Past Medical History  Diagnosis Date  . PTSD (post-traumatic stress disorder)     with depressive symptoms  . Constipation, chronic   . Acid reflux     occasional  . Migraines   . 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 Heart murmur     Negative echo in the past  . Urticaria     stress induced  . History of chicken pox   . Anemia in the past    is a blood donor  . Allergic rhinitis, seasonal     vasomotor rhinitis  . Dysmenorrhea   . OSA (obstructive sleep apnea)     PSG 08/20/10 RDI 18.7  . IBS (irritable bowel syndrome)    Past Surgical History  Procedure Date  . Wisdom tooth extraction    History  Substance Use Topics  . Smoking status: Never Smoker   . Smokeless tobacco: Never Used  . Alcohol Use: Yes     Rare   Family History  Problem Relation Age of Onset  . Hypertension Father   . Allergies Father   . Other Mother     Digestive problems-w/u @ [facility]-? bact inf  . Diabetes      GM  . Prostate  cancer      GF  . Anxiety disorder      diffusely in family  . Depression      diffusely in family  . Colon cancer Neg Hx    No Known Allergies Current Outpatient Prescriptions on File Prior to Visit  Medication Sig Dispense Refill  . acetaminophen (TYLENOL) 500 MG tablet Take 500 mg by mouth every 6 (six) hours as needed. For headache      . buPROPion (WELLBUTRIN XL) 300 MG 24 hr tablet Take 300 mg by mouth daily.        . clonazePAM (KLONOPIN) 0.5 MG tablet Take 0.5 mg by mouth at bedtime.       . cyclobenzaprine (FLEXERIL) 10 MG tablet Take 1 tablet (10 mg total) by mouth 3 (three) times daily as needed for muscle spasms (and headache).  30 tablet  3  . eletriptan (RELPAX) 40 MG tablet One tablet by mouth at onset of headache. May repeat in 2 hours if headache persists or recurs. may repeat in 2 hours if necessary  10 tablet  0  . escitalopram (LEXAPRO) 20 MG tablet Take 20 mg by mouth daily.      Marland Kitchen  esomeprazole (NEXIUM) 40 MG capsule Take 1 capsule (40 mg total) by mouth 2 (two) times daily.  60 capsule  5  . fluticasone (FLONASE) 50 MCG/ACT nasal spray Place 2 sprays into the nose daily.  16 g  11  . Melatonin 3 MG TABS Take 3-6 mg by mouth at bedtime as needed. For sleep      . Multiple Vitamin (MULTIVITAMIN) tablet Take 1 tablet by mouth daily.        . Polyethylene Glycol 3350 (MIRALAX PO) Take by mouth daily.           Review of Systems Review of Systems  Constitutional: Negative for fever, appetite change, fatigue and unexpected weight change.  Eyes: Negative for pain and visual disturbance.  Respiratory: Negative for cough and shortness of breath.   Cardiovascular: Negative for cp or palpitations    Gastrointestinal: Negative for , diarrhea and constipation. pos for nausea with ha Genitourinary: Negative for urgency and frequency.  Skin: Negative for pallor or rash   Neurological: Negative for weakness, light-headedness, numbness and pos for headache msk pos for neck  soreness- spasm that is getting better  Hematological: Negative for adenopathy. Does not bruise/bleed easily.  Psychiatric/Behavioral: Negative for dysphoric mood. The patient is not nervous/anxious.         Objective:   Physical Exam  Constitutional: She appears well-developed and well-nourished. No distress.  HENT:  Head: Normocephalic and atraumatic.  Eyes: Conjunctivae normal and EOM are normal. Pupils are equal, round, and reactive to light.  Neck: Normal range of motion. Neck supple.  Neurological: She is alert. She displays normal reflexes.       Nl gait  Skin: Skin is warm and dry. No rash noted. No erythema.  Psychiatric: She has a normal mood and affect.          Assessment & Plan:

## 2012-07-26 ENCOUNTER — Telehealth: Payer: Self-pay | Admitting: Family Medicine

## 2012-07-26 NOTE — Telephone Encounter (Signed)
That is good news.

## 2012-08-22 ENCOUNTER — Other Ambulatory Visit: Payer: Self-pay | Admitting: Family Medicine

## 2012-08-22 NOTE — Telephone Encounter (Signed)
Electronic refill request.  Please advise. 

## 2012-08-22 NOTE — Telephone Encounter (Signed)
Please give her 3 refils, thanks

## 2012-08-23 NOTE — Telephone Encounter (Signed)
done

## 2012-08-25 ENCOUNTER — Telehealth: Payer: Self-pay

## 2012-08-25 NOTE — Telephone Encounter (Signed)
[facility] faxed prior auth for Relpax; Pt said averages 6 h/a's per month; approval given over phone [phone] spoke with Viviann Spare. Approved 08/25/12 thru 08/25/13. Case # [id number]. [facility] notified spoke with [place]. [facility] will notify [facility]. Approval letter to follow.

## 2012-09-06 ENCOUNTER — Telehealth: Payer: Self-pay | Admitting: Family Medicine

## 2012-09-06 ENCOUNTER — Ambulatory Visit (INDEPENDENT_AMBULATORY_CARE_PROVIDER_SITE_OTHER): Payer: BC Managed Care – PPO | Admitting: Family Medicine

## 2012-09-06 ENCOUNTER — Encounter: Payer: Self-pay | Admitting: Family Medicine

## 2012-09-06 VITALS — BP 98/60 | HR 72 | Temp 98.1°F | Wt 150.0 lb

## 2012-09-06 DIAGNOSIS — G43909 Migraine, unspecified, not intractable, without status migrainosus: Secondary | ICD-10-CM

## 2012-09-06 MED ORDER — PROMETHAZINE HCL 25 MG/ML IJ SOLN
25.0000 mg | Freq: Once | INTRAMUSCULAR | Status: AC
  Administered 2012-09-06: 25 mg via INTRAVENOUS

## 2012-09-06 MED ORDER — KETOROLAC TROMETHAMINE 60 MG/2ML IM SOLN
60.0000 mg | Freq: Once | INTRAMUSCULAR | Status: AC
  Administered 2012-09-06: 60 mg via INTRAMUSCULAR

## 2012-09-06 NOTE — Telephone Encounter (Signed)
Patient Information:  Caller Name: [place]  Phone: [phone]  Patient: Debra Edwards, Debra Edwards  Gender: Female  DOB: Feb 18, 1976  Age: 36 Years  PCP: [place], Idamae Schuller [facility])  Pregnant: No   Symptoms  Reason For Call & Symptoms: Migraine since 09/04/2012  Reviewed Health History In EMR: Yes  Reviewed Medications In EMR: Yes  Reviewed Allergies In EMR: Yes  Reviewed Surgeries / Procedures: Yes  Date of Onset of Symptoms: 09/04/2012  Treatments Tried: Relpax rx, resting , increased Fluids  Treatments Tried Worked: No OB:  LMP: 08/29/2012  Guideline(s) Used:  Headache  Headache  Disposition Per Guideline:   See Today in Office  Reason For Disposition Reached:   Patient wants to be seen  Advice Given:  N/A  Office Follow Up:  Does the office need to follow up with this patient?: No  Instructions For The Office: N/A  Appointment Scheduled:  09/06/2012 09:45:00 Appointment Scheduled Provider:  Ruthe Mannan (Family Practice)  RN Note:  Migraine since 09/04/2012 . She took Relpax  x 2 doses on 09/05/2012  and was able to sleep last night, but has woke with headache this morning. Rating headache 5/10 during call. Pt is wanting appt to get injection

## 2012-09-06 NOTE — Addendum Note (Signed)
Addended by: Eliezer Bottom on: 09/06/2012 10:11 AM   Modules accepted: Orders

## 2012-09-06 NOTE — Progress Notes (Signed)
Subjective:    Patient ID: Debra Edwards, female    DOB: 1976/07/30, 36 y.o.   MRN: [id number]  HPI 36 yo pt of Dr. Milinda Antis with h/o migraines here for migraine cycle she cannot break.    Started 3 days ago with days ago with neck tightness Then got worse over the weekend - took 2 relpax with improvement but not total relief  Pain today is 6/10 with associated nausea (no vomiting), photophobia. No other symptoms Told to come in for toradol/ phenergan injections that usually break the cycle  Patient Active Problem List  Diagnosis  . NEOPLASM, SKIN, UNCERTAIN BEHAVIOR  . UNSPECIFIED ANEMIA  . BRUXISM  . PTSD  . OBSTRUCTIVE SLEEP APNEA  . RESTLESS LEG SYNDROME  . ALLERGIC RHINITIS  . FIBROCYSTIC BREAST DISEASE  . DYSMENORRHEA  . HAND PAIN  . OTHER DYSPNEA AND RESPIRATORY ABNORMALITIES  . IBS (irritable bowel syndrome)  . Infectious disease exposure  . GERD (gastroesophageal reflux disease)  . Stress reaction  . Migraine headache   Past Medical History  Diagnosis Date  . PTSD (post-traumatic stress disorder)     with depressive symptoms  . Constipation, chronic   . Acid reflux     occasional  . Migraines   . 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 Heart murmur     Negative echo in the past  . Urticaria     stress induced  . History of chicken pox   . Anemia in the past    is a blood donor  . Allergic rhinitis, seasonal     vasomotor rhinitis  . Dysmenorrhea   . OSA (obstructive sleep apnea)     PSG 08/20/10 RDI 18.7  . IBS (irritable bowel syndrome)    Past Surgical History  Procedure Date  . Wisdom tooth extraction    History  Substance Use Topics  . Smoking status: Never Smoker   . Smokeless tobacco: Never Used  . Alcohol Use: Yes     Comment: Rare   Family History  Problem Relation Age of Onset  . Hypertension Father   . Allergies Father   . Other Mother     Digestive problems-w/u @ [facility]-? bact inf  . Diabetes      GM  . Prostate cancer      GF  . Anxiety disorder      diffusely in family  . Depression      diffusely in family  . Colon cancer Neg Hx    No Known Allergies Current Outpatient Prescriptions on File Prior to Visit  Medication Sig Dispense Refill  . acetaminophen (TYLENOL) 500 MG tablet Take 500 mg by mouth every 6 (six) hours as needed. For headache      . buPROPion (WELLBUTRIN XL) 300 MG 24 hr tablet Take 300 mg by mouth daily.        . clonazePAM (KLONOPIN) 0.5 MG tablet Take 0.5 mg by mouth at bedtime.       . cyclobenzaprine (FLEXERIL) 10 MG tablet Take 1 tablet (10 mg total) by mouth 3 (three) times daily as needed for muscle spasms (and headache).  30 tablet  3  . escitalopram (LEXAPRO) 20 MG tablet Take 20 mg by mouth daily.      Marland Kitchen esomeprazole (NEXIUM) 40 MG capsule Take 1 capsule (40 mg total) by mouth 2 (two) times daily.  60 capsule  5  .  fluticasone (FLONASE) 50 MCG/ACT nasal spray Place 2 sprays into the nose daily.  16 g  11  . Green Tea, Camillia sinensis, POWD by Does not apply route as needed.      . Melatonin 3 MG TABS Take 3-6 mg by mouth at bedtime as needed. For sleep      . Multiple Vitamin (MULTIVITAMIN) tablet Take 1 tablet by mouth daily.        . Polyethylene Glycol 3350 (MIRALAX PO) Take by mouth daily.       . RELPAX 40 MG tablet TAKE 1 TABLET AT ONSET OF FOR HEADACHE . MAY REPEAT 1 TIME 2 HOURS LATE IF NEEDED  10 tablet  2      Review of Systems See HPI       Objective:   Physical Exam  BP 98/60  Pulse 72  Temp 98.1 F (36.7 C)  Wt 150 lb (68.04 kg)  Constitutional: She appears well-developed and well-nourished. No distress.  HENT:  Head: Normocephalic and atraumatic.  Eyes: Conjunctivae normal and EOM are normal. Pupils are equal, round, and reactive to light.  Neck: Normal range of motion. Neck supple.  Neurological: She is alert. She displays normal reflexes.       Nl gait  Skin: Skin is warm and dry.  No rash noted. No erythema.  Psychiatric: She has a normal mood and affect.      Assessment & Plan:   1. Migraine headache   given IM toradol 60 mg and phenergan 25 mg in office today. Pt to call if no improvement tomorrow. The patient indicates understanding of these issues and agrees with the plan.

## 2012-09-08 ENCOUNTER — Other Ambulatory Visit: Payer: Self-pay | Admitting: *Deleted

## 2012-09-08 MED ORDER — ESOMEPRAZOLE MAGNESIUM 40 MG PO CPDR: 40.0000 mg | DELAYED_RELEASE_CAPSULE | Freq: Two times a day (BID) | ORAL | Status: DC

## 2012-10-10 ENCOUNTER — Other Ambulatory Visit: Payer: Self-pay | Admitting: *Deleted

## 2012-10-10 MED ORDER — ESOMEPRAZOLE MAGNESIUM 40 MG PO CPDR: 40.0000 mg | DELAYED_RELEASE_CAPSULE | Freq: Every day | ORAL | Status: DC

## 2012-10-13 ENCOUNTER — Telehealth: Payer: Self-pay

## 2012-10-13 NOTE — Telephone Encounter (Signed)
Received faxed prior auth for nexium 40 mg from [facility]. I faxed [facility] copy of approval letter for Nexium 40 mg that was approved from 05/24/12 until 06/14/13.

## 2012-10-26 ENCOUNTER — Telehealth: Payer: Self-pay | Admitting: Family Medicine

## 2012-10-26 NOTE — Telephone Encounter (Signed)
I do hormone tests to see if a pt is menopausal (no periods) - if she is having periods - it will not do [place] any good - but thanks for letting me know and we will discuss it further when she comes

## 2012-10-26 NOTE — Telephone Encounter (Signed)
Pt is scheduled on 10/28/2012 to have lab work for her CPE. She says she is having more frequent migraines and her periods are painful. She asked to see if you could place an order for her hormone levels to be checked at the same time as her labs for CPE. Is this ok w/you?  Thank you.

## 2012-10-27 ENCOUNTER — Telehealth: Payer: Self-pay | Admitting: Family Medicine

## 2012-10-27 DIAGNOSIS — Z0000 Encounter for general adult medical examination without abnormal findings: Secondary | ICD-10-CM | POA: Insufficient documentation

## 2012-10-27 NOTE — Telephone Encounter (Signed)
Message copied by Judy Pimple on Thu Oct 27, 2012  9:57 PM ------      Message from: Baldomero Lamy      Created: Wed Oct 19, 2012 11:45 AM      Regarding: Cpx labs Fri 1/24       Please order  future cpx labs for pt's upcoming lab appt.      Thanks      Rodney Booze

## 2012-10-27 NOTE — Telephone Encounter (Signed)
Pt.notified

## 2012-10-28 ENCOUNTER — Other Ambulatory Visit (INDEPENDENT_AMBULATORY_CARE_PROVIDER_SITE_OTHER): Payer: BC Managed Care – PPO

## 2012-10-28 DIAGNOSIS — Z0000 Encounter for general adult medical examination without abnormal findings: Secondary | ICD-10-CM

## 2012-10-28 LAB — CBC WITH DIFFERENTIAL/PLATELET
Basophils Absolute: 0 10*3/uL (ref 0.0–0.1)
Basophils Relative: 0.1 % (ref 0.0–3.0)
HCT: 37.3 % (ref 36.0–46.0)
Lymphocytes Relative: 39.1 % (ref 12.0–46.0)
MCV: 87.4 fl (ref 78.0–100.0)
Monocytes Relative: 8.2 % (ref 3.0–12.0)
Neutro Abs: 2.1 10*3/uL (ref 1.4–7.7)
WBC: 4.3 10*3/uL — ABNORMAL LOW (ref 4.5–10.5)

## 2012-10-28 LAB — TSH: TSH: 2.13 u[IU]/mL (ref 0.35–5.50)

## 2012-10-28 LAB — COMPREHENSIVE METABOLIC PANEL
ALT: 14 U/L (ref 0–35)
Albumin: 4.2 g/dL (ref 3.5–5.2)
CO2: 29 mEq/L (ref 19–32)
Calcium: 9 mg/dL (ref 8.4–10.5)
Chloride: 105 mEq/L (ref 96–112)
Creatinine, Ser: 0.7 mg/dL (ref 0.4–1.2)
GFR: 93.78 mL/min (ref >60.00)
Potassium: 4.1 mEq/L (ref 3.5–5.1)

## 2012-10-28 LAB — LIPID PANEL: HDL: 57.2 mg/dL (ref >39.00)

## 2012-11-03 ENCOUNTER — Encounter: Payer: Self-pay | Admitting: Family Medicine

## 2012-11-03 ENCOUNTER — Telehealth: Payer: Self-pay | Admitting: Family Medicine

## 2012-11-03 ENCOUNTER — Ambulatory Visit (INDEPENDENT_AMBULATORY_CARE_PROVIDER_SITE_OTHER): Payer: BC Managed Care – PPO | Admitting: Family Medicine

## 2012-11-03 VITALS — BP 102/60 | HR 76 | Temp 98.3°F | Wt 149.8 lb

## 2012-11-03 DIAGNOSIS — J069 Acute upper respiratory infection, unspecified: Secondary | ICD-10-CM

## 2012-11-03 NOTE — Telephone Encounter (Signed)
Patient Information:  Caller Name: Gean  Phone: [phone]  Patient: Genene, Kilman  Gender: Female  DOB: 1975-12-19  Age: 37 Years  PCP: Tower, [employer] [facility])  Pregnant: No  Office Follow Up:  Does the office need to follow up with this patient?: No  Instructions For The Office: N/A   Symptoms  Reason For Call & Symptoms: pt reports she has had cold symptoms for a couple of weeks.  Today she began having ear pain  Reviewed Health History In EMR: Yes  Reviewed Medications In EMR: Yes  Reviewed Allergies In EMR: Yes  Reviewed Surgeries / Procedures: Yes  Date of Onset of Symptoms: 11/03/2012  Treatments Tried: Tylenol  Treatments Tried Worked: Yes OB / GYN:  LMP: 10/25/2012  Guideline(s) Used:  Earache  Disposition Per Guideline:   See Today in Office  Reason For Disposition Reached:   All other earaches (Exceptions: earache lasting < 1 hour, and earache from air travel)  Advice Given:  Call Back If  You become worse.  Appointment Scheduled:  11/03/2012 15:00:00 Appointment Scheduled Provider:  Crawford Givens Clelia Croft) [facility])  (Dr Milinda Antis was unavailable)

## 2012-11-03 NOTE — Progress Notes (Signed)
Recently with cold sx.  She was a little achy last weeks.  Some swollen glands in neck.  Today with sharp L ear pain.  Tylenol didn't help very long.  No FCNAV.  Minimal R ear pain.  Postnasal gtt noted.  No ear drainage.  No cough.  She had some facial pain last week, better today.    Meds, vitals, and allergies reviewed.   ROS: See HPI.  Otherwise, noncontributory.  GEN: nad, alert and oriented HEENT: mucous membranes moist, tm w/o erythema, L>R SOM noted, nasal exam w/o erythema, clear discharge noted NECK: supple w/o LA CV: rrr.   PULM: ctab, no inc wob EXT: no edema SKIN: no acute rash

## 2012-11-03 NOTE — Assessment & Plan Note (Signed)
Likely viral.  Nontoxic.  With likely ETD.  Restart flonase. Use a neti pot a few times a day and gently try to "pop" your ears.  F/u prn.

## 2012-11-03 NOTE — Patient Instructions (Addendum)
Get back on the flonase.  Use a neti pot a few times a day and gently try to "pop" your ears.

## 2012-11-03 NOTE — Telephone Encounter (Signed)
Will see today.  

## 2012-11-07 ENCOUNTER — Ambulatory Visit (INDEPENDENT_AMBULATORY_CARE_PROVIDER_SITE_OTHER): Payer: BC Managed Care – PPO | Admitting: Family Medicine

## 2012-11-07 ENCOUNTER — Encounter: Payer: Self-pay | Admitting: Family Medicine

## 2012-11-07 ENCOUNTER — Other Ambulatory Visit (HOSPITAL_COMMUNITY)
Admission: RE | Admit: 2012-11-07 | Discharge: 2012-11-07 | Disposition: A | Discharge status: Home / Self Care | Payer: BC Managed Care – PPO | Source: Ambulatory Visit | Attending: Family Medicine | Admitting: Family Medicine

## 2012-11-07 VITALS — BP 102/66 | HR 79 | Temp 98.2°F | Ht 62.5 in | Wt 147.5 lb

## 2012-11-07 DIAGNOSIS — Z01419 Encounter for gynecological examination (general) (routine) without abnormal findings: Secondary | ICD-10-CM | POA: Insufficient documentation

## 2012-11-07 DIAGNOSIS — Z0000 Encounter for general adult medical examination without abnormal findings: Secondary | ICD-10-CM

## 2012-11-07 DIAGNOSIS — G43909 Migraine, unspecified, not intractable, without status migrainosus: Secondary | ICD-10-CM

## 2012-11-07 DIAGNOSIS — Z1151 Encounter for screening for human papillomavirus (HPV): Secondary | ICD-10-CM | POA: Insufficient documentation

## 2012-11-07 DIAGNOSIS — N946 Dysmenorrhea, unspecified: Secondary | ICD-10-CM

## 2012-11-07 MED ORDER — LEVONORGESTREL-ETHINYL ESTRAD 0.1-20 MG-MCG PO TABS: 1.0000 | ORAL_TABLET | Freq: Every day | ORAL | Status: DC

## 2012-11-07 NOTE — Progress Notes (Signed)
Subjective:    Patient ID: Debra Edwards, female    DOB: 1976/07/12, 37 y.o.   MRN: [id number]  HPI Here for health maintenance exam and to review chronic medical problems    Wt is down 2 lb with bmi of 26 Is taking very good care of herself  Got off all her sodas and caffeine (replaced it with water) and now packs her lunch with healthier foods Using nutra bullet machine - getting more fruit and vegetables - this really helps her energy  Also exercising regularly- circuit/ yoga/ power yoga also works out some with a Consulting civil engineer also - up to 7 miles   Overall is doing well  In good spirits   Pap 8/10 Does not have a gyn  Menses- continue to be a problem - very heavy and a lot of cramping - makes her feel sick/ tired and nauseated  She hates this  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 What is covered Will find out    Labs  No anemia Lab Results  Component Value Date   CHOL 137 10/28/2012   HDL 57.20 10/28/2012   LDLCALC 67 10/28/2012   TRIG 64.0 10/28/2012   CHOLHDL 2 10/28/2012   is at goal   Patient Active Problem List  Diagnosis  . NEOPLASM, SKIN, UNCERTAIN BEHAVIOR  . UNSPECIFIED ANEMIA  . BRUXISM  . PTSD  . OBSTRUCTIVE SLEEP APNEA  . RESTLESS LEG SYNDROME  . ALLERGIC RHINITIS  . FIBROCYSTIC BREAST DISEASE  . DYSMENORRHEA  . HAND PAIN  . OTHER DYSPNEA AND RESPIRATORY ABNORMALITIES  . IBS (irritable bowel syndrome)  . Infectious disease exposure  . GERD  (gastroesophageal reflux disease)  . Stress reaction  . Migraine headache  . Routine general medical examination at a health care facility   Past Medical History  Diagnosis Date  . PTSD (post-traumatic stress disorder)     with depressive symptoms  . Constipation, chronic   . Acid reflux     occasional  . Migraines   . 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 Heart murmur     Negative echo in the past  . Urticaria     stress induced  . History of chicken pox   . Anemia in the past    is a blood donor  . Allergic rhinitis, seasonal     vasomotor rhinitis  . Dysmenorrhea   . OSA (obstructive sleep apnea)     PSG 08/20/10 RDI 18.7  . IBS (irritable bowel syndrome)    Past Surgical History  Procedure Date  . Wisdom tooth extraction    History  Substance Use Topics  . Smoking status: Never Smoker   . Smokeless tobacco: Never Used  . Alcohol Use: Yes     Comment: Rare  Family History  Problem Relation Age of Onset  . Hypertension Father   . Allergies Father   . Other Mother     Digestive problems-w/u @ [facility]-? bact inf  . Diabetes      GM  . Prostate cancer      GF  . Anxiety disorder      diffusely in family  . Depression      diffusely in family  . Colon cancer Neg Hx    No Known Allergies Current Outpatient Prescriptions on File Prior to Visit  Medication Sig Dispense Refill  . acetaminophen (TYLENOL) 500 MG tablet Take 500 mg by mouth every 6 (six) hours as needed. For headache      . buPROPion (WELLBUTRIN XL) 300 MG 24 hr tablet Take 300 mg by mouth daily.        . clonazePAM (KLONOPIN) 0.5 MG tablet Take 0.5 mg by mouth at bedtime.       . cyclobenzaprine (FLEXERIL) 10 MG tablet Take 1 tablet (10 mg total) by mouth 3 (three) times daily as needed for muscle spasms (and headache).  30 tablet  3  . escitalopram (LEXAPRO) 20 MG tablet Take 20 mg by mouth daily.      Marland Kitchen esomeprazole (NEXIUM) 40 MG  capsule Take 1 capsule (40 mg total) by mouth daily.  30 capsule  5  . fluticasone (FLONASE) 50 MCG/ACT nasal spray Place 2 sprays into the nose daily.  16 g  11  . Green Tea, Camillia sinensis, POWD by Does not apply route as needed.      . Melatonin 3 MG TABS Take 3-6 mg by mouth at bedtime as needed. For sleep      . Multiple Vitamin (MULTIVITAMIN) tablet Take 1 tablet by mouth daily.        . Polyethylene Glycol 3350 (MIRALAX PO) Take by mouth daily.       . RELPAX 40 MG tablet TAKE 1 TABLET AT ONSET OF FOR HEADACHE . MAY REPEAT 1 TIME 2 HOURS LATE IF NEEDED  10 tablet  2    Review of Systems Review of Systems  Constitutional: Negative for fever, appetite change, fatigue and unexpected weight change.  Eyes: Negative for pain and visual disturbance.  Respiratory: Negative for cough and shortness of breath.   Cardiovascular: Negative for cp or palpitations    Gastrointestinal: Negative for nausea, diarrhea and constipation.  Genitourinary: Negative for urgency and frequency. pos for painful and heavy menses  Skin: Negative for pallor or rash   Neurological: Negative for weakness, light-headedness, numbness and pos for headaches.  Hematological: Negative for adenopathy. Does not bruise/bleed easily.  Psychiatric/Behavioral: Negative for dysphoric mood. The patient is not nervous/anxious.         Objective:   Physical Exam  Constitutional: She appears well-developed and well-nourished. No distress.  HENT:  Head: Normocephalic and atraumatic.  Right Ear: External ear normal.  Left Ear: External ear normal.  Nose: Nose normal.  Mouth/Throat: Oropharynx is clear and moist.       No sinus tenderness   Eyes: Conjunctivae normal and EOM are normal. Pupils are equal, round, and reactive to light. Right eye exhibits no discharge. Left eye exhibits no discharge. No scleral icterus.  Neck: Normal range of motion. Neck supple. No JVD present. Carotid bruit is not present. No thyromegaly  present.  Cardiovascular: Normal rate, regular rhythm, normal heart sounds and intact distal pulses.  Exam reveals no gallop.   Pulmonary/Chest: Effort normal and  breath sounds normal. No respiratory distress. She has no wheezes.  Abdominal: Soft. Bowel sounds are normal. She exhibits no distension, no abdominal bruit and no mass. There is no tenderness.  Genitourinary: Vagina normal and uterus normal. No breast swelling, discharge or bleeding. There is no rash, tenderness or lesion on the right labia. There is no rash, tenderness or lesion on the left labia. Uterus is not enlarged. Cervix exhibits no motion tenderness, no discharge and no friability. Right adnexum displays no mass, no tenderness and no fullness. Left adnexum displays no mass, no tenderness and no fullness. No bleeding around the vagina. No vaginal discharge found.       Breast exam: No mass, nodules, thickening, tenderness, bulging, retraction, inflamation, nipple discharge or skin changes noted.  No axillary or clavicular LA.  Chaperoned exam.   Small introitus -small speculum used -some discomfort and difficult to visualize/ access cervix for pap  Musculoskeletal: She exhibits no edema and no tenderness.  Lymphadenopathy:    She has no cervical adenopathy.  Neurological: She is alert. She has normal reflexes. No cranial nerve deficit. She exhibits normal muscle tone. Coordination normal.  Skin: Skin is warm and dry. No rash noted. No erythema. No pallor.  Psychiatric: She has a normal mood and affect.          Assessment & Plan:

## 2012-11-07 NOTE — Assessment & Plan Note (Signed)
Reviewed health habits including diet and exercise and skin cancer prevention Also reviewed health mt list, fam hx and immunizations   Wellness labs reviewed Commended on lifestyle change

## 2012-11-07 NOTE — Assessment & Plan Note (Signed)
This and heavy menses are troublesome Agreeable to try low dose OC  Will try lessina and update - adv not to smoke and update if not imp in 3 mo

## 2012-11-07 NOTE — Assessment & Plan Note (Signed)
Annual exam with pap  C/o of heavy and painful menses  Will try low dose OC -disc poss side eff in detail

## 2012-11-07 NOTE — Patient Instructions (Addendum)
Try lessina - start the first Sunday after period starts (even if period starts on a Sunday) Update if side effects or problems  Update if no improvement in your periods after 2-3 months on it  If migraines do not improve - please also let me know - we may need to discuss a prophylactic medication

## 2012-11-07 NOTE — Assessment & Plan Note (Signed)
Not as frequent but long lasting - and troublesome Has done a good job with lifestyle change We will try a low dose OC for menses-perhaps this may also help ha  If not consider proplylaxis

## 2012-11-14 ENCOUNTER — Encounter: Payer: Self-pay | Admitting: *Deleted

## 2012-12-23 ENCOUNTER — Telehealth: Payer: Self-pay | Admitting: Family Medicine

## 2012-12-23 ENCOUNTER — Ambulatory Visit (INDEPENDENT_AMBULATORY_CARE_PROVIDER_SITE_OTHER): Payer: BC Managed Care – PPO | Admitting: Family Medicine

## 2012-12-23 ENCOUNTER — Encounter: Payer: Self-pay | Admitting: Family Medicine

## 2012-12-23 VITALS — BP 98/58 | HR 72 | Temp 98.4°F | Wt 150.0 lb

## 2012-12-23 DIAGNOSIS — R0789 Other chest pain: Secondary | ICD-10-CM

## 2012-12-23 DIAGNOSIS — R071 Chest pain on breathing: Secondary | ICD-10-CM

## 2012-12-23 NOTE — Telephone Encounter (Signed)
Patient Information:  Caller Name: Jezabella  Phone: [phone]  Patient: Debra Edwards, Debra Edwards  Gender: Female  DOB: [birth year]  Age: 37 Years  PCP: Tower, [employer] [facility])  Pregnant: No  Office Follow Up:  Does the office need to follow up with this patient?: No  Instructions For The Office: N/A   Symptoms  Reason For Call & Symptoms: Pt fell onto her left breast while lunging after her cat. She leaped across and landed on her footboard. She developed a large bruise. It was severely painful. Now the breast has stopped hurting but as of yesterday am she realized there is pain that is underneath her breast which is causing her difficulty breathing.  Reviewed Health History In EMR: Yes  Reviewed Medications In EMR: Yes  Reviewed Allergies In EMR: Yes  Reviewed Surgeries / Procedures: Yes  Date of Onset of Symptoms: 12/17/2012 OB / GYN:  LMP: Unknown  Guideline(s) Used:  Chest Injury  Disposition Per Guideline:   Go to ED Now (or to Office with PCP Approval)  Reason For Disposition Reached:   Can't take a deep breath but no respiratory distress (e.g., hurts to take a deep breath)  Advice Given:  N/A  Patient Will Follow Care Advice:  YES  Appointment Scheduled:  12/23/2012 14:00:00 Appointment Scheduled Provider:  N/A

## 2012-12-23 NOTE — Patient Instructions (Addendum)
Use can take tylenol if needed and this should gradually get better.  Take care.

## 2012-12-23 NOTE — Progress Notes (Signed)
She has to feed her cats separately and she slipped doing so, hit the bed and bed frame.  This was 1 week ago.  Pain with movement. Bruising and pain on the left breast. Yesterday the breast pain was better but the area deep to the L breast was more sore.  It is sore with exercise. Pain with sneezing, deep breath.  Pain laying on stomach.  Changing bras helped some.   Meds, vitals, and allergies reviewed.   ROS: See HPI.  Otherwise, noncontributory.  nad ncat rrr ctab Breast exam: No mass, nodules, thickening, tenderness, bulging, retraction, inflamation, nipple discharge or skin changes noted except for bruise on the L lateral breast noted.  No axillary or clavicular LA.  Chaperoned exam.  Chest wall ttp, pain with deep breath along L anterior midclavicular line but improved with compression during deep breath.

## 2012-12-25 DIAGNOSIS — R0789 Other chest pain: Secondary | ICD-10-CM | POA: Insufficient documentation

## 2012-12-25 NOTE — Assessment & Plan Note (Signed)
Reassured, should resolve.  Relative rest, tylenol is reasonable.  Not likely to have a deep hematoma in the breast that would need intervention.  F/u prn.

## 2013-01-18 ENCOUNTER — Telehealth: Payer: Self-pay

## 2013-01-18 MED ORDER — OMEPRAZOLE 40 MG PO CPDR: 40.0000 mg | DELAYED_RELEASE_CAPSULE | Freq: Every day | ORAL | Status: DC

## 2013-01-18 NOTE — Telephone Encounter (Signed)
In 11/2012 pt insurance not cover Nexium and pt tried omeprazole again. Pt said omeprazole (pt not sure mg)? 40 mg taking one daily has really helped burping and stomach issues; [facility].

## 2013-01-18 NOTE — Telephone Encounter (Signed)
Will send omeprazole 40 mg to her pharmacy and let me know if this does not continue to work for her, thanks

## 2013-01-18 NOTE — Telephone Encounter (Signed)
Pt.notified

## 2013-03-24 ENCOUNTER — Telehealth: Payer: Self-pay

## 2013-03-24 NOTE — Telephone Encounter (Signed)
I would continue as is for now.  I'll send to PCP as a FYI, for her comment.

## 2013-03-24 NOTE — Telephone Encounter (Signed)
Patient advised. Not forwarded to Dr. Milinda Antis.

## 2013-03-24 NOTE — Telephone Encounter (Signed)
Pt was to call back re; BC pill; pt left v/m that she is doing well the 3 weeks she takes the medication; the 1 week off med is not as good but overall pt's mood is better and has had no migraines since on [facility].Please advise.

## 2013-03-28 NOTE — Telephone Encounter (Signed)
Left voicemail advising pt of Dr. Royden Purl recommendations

## 2013-03-28 NOTE — Telephone Encounter (Signed)
I would continue this and give it some more time - at least another month- if the week off is intolerable, let me know - but I'm glad she is starting to do better

## 2013-07-12 ENCOUNTER — Encounter: Payer: Self-pay | Admitting: Family Medicine

## 2013-07-12 ENCOUNTER — Ambulatory Visit (INDEPENDENT_AMBULATORY_CARE_PROVIDER_SITE_OTHER): Payer: BC Managed Care – PPO | Admitting: Family Medicine

## 2013-07-12 VITALS — BP 94/58 | HR 75 | Temp 98.5°F | Ht 62.5 in | Wt 151.5 lb

## 2013-07-12 DIAGNOSIS — B001 Herpesviral vesicular dermatitis: Secondary | ICD-10-CM | POA: Insufficient documentation

## 2013-07-12 DIAGNOSIS — B009 Herpesviral infection, unspecified: Secondary | ICD-10-CM

## 2013-07-12 MED ORDER — VALACYCLOVIR HCL 1 G PO TABS: ORAL_TABLET | ORAL | Status: DC

## 2013-07-12 NOTE — Progress Notes (Signed)
Subjective:    Patient ID: Debra Edwards, female    DOB: 1975/11/25, 37 y.o.   MRN: [id number]  HPI Here with cold sores on lips  Has had them on and off for years  Come out with viral uri or stress Used abreeva and campho phenique Not a lot of improvement   Cold symptoms  Runny and stuffy nose  No GI symptoms  No fever  Overall getting better   Patient Active Problem List   Diagnosis Date Noted  . Chest wall pain 12/25/2012  . Routine gynecological examination 11/07/2012  . Routine general medical examination at a health care facility 10/27/2012  . Migraine headache 06/24/2012  . Stress reaction 03/16/2012  . GERD (gastroesophageal reflux disease) 02/19/2012  . Infectious disease exposure 05/04/2011  . IBS (irritable bowel syndrome) 04/14/2011  . OBSTRUCTIVE SLEEP APNEA 09/05/2010  . BRUXISM 07/24/2010  . RESTLESS LEG SYNDROME 07/24/2010  . OTHER DYSPNEA AND RESPIRATORY ABNORMALITIES 07/08/2010  . ALLERGIC RHINITIS 12/17/2009  . HAND PAIN 06/11/2009  . NEOPLASM, SKIN, UNCERTAIN BEHAVIOR 05/22/2009  . PTSD 10/20/2007  . FIBROCYSTIC BREAST DISEASE 10/20/2007  . DYSMENORRHEA 10/20/2007   Past Medical History  Diagnosis Date  . PTSD (post-traumatic stress disorder)     with depressive symptoms  . Constipation, chronic   . Acid reflux     occasional  . Migraines   . 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 Heart murmur     Negative echo in the past  . Urticaria     stress induced  . History of chicken pox   . Anemia in the past    is a blood donor  . Allergic rhinitis, seasonal     vasomotor rhinitis  . Dysmenorrhea   . OSA (obstructive sleep apnea)     PSG 08/20/10 RDI 18.7  . IBS (irritable bowel syndrome)    Past Surgical History  Procedure Laterality Date  . Wisdom tooth extraction     History  Substance Use Topics  . Smoking status: Never Smoker   . Smokeless tobacco: Never Used  .  Alcohol Use: Yes     Comment: Rare   Family History  Problem Relation Age of Onset  . Hypertension Father   . Allergies Father   . Other Mother     Digestive problems-w/u @ [facility]-? bact inf  . Diabetes      GM  . Prostate cancer      GF  . Anxiety disorder      diffusely in family  . Depression      diffusely in family  . Colon cancer Neg Hx    No Known Allergies Current Outpatient Prescriptions on File Prior to Visit  Medication Sig Dispense Refill  . acetaminophen (TYLENOL) 500 MG tablet Take 500 mg by mouth every 6 (six) hours as needed. For headache      . buPROPion (WELLBUTRIN XL) 300 MG 24 hr tablet Take 300 mg by mouth daily.        . clonazePAM (KLONOPIN) 0.5 MG tablet Take 0.5 mg by mouth at bedtime.       . cyclobenzaprine (FLEXERIL) 10 MG tablet Take 1 tablet (10 mg total) by mouth 3 (three) times daily as needed for muscle spasms (and headache).  30 tablet  3  . escitalopram (LEXAPRO) 20 MG tablet Take 20 mg by mouth daily.      . fluticasone (FLONASE)  50 MCG/ACT nasal spray Place 2 sprays into the nose daily.  16 g  11  . levonorgestrel-ethinyl estradiol (AVIANE,ALESSE,LESSINA) 0.1-20 MG-MCG tablet Take 1 tablet by mouth daily.  1 Package  11  . Melatonin 3 MG TABS Take 3-6 mg by mouth at bedtime as needed. For sleep      . Multiple Vitamin (MULTIVITAMIN) tablet Take 1 tablet by mouth daily.        . Polyethylene Glycol 3350 (MIRALAX PO) Take by mouth daily.       . RELPAX 40 MG tablet TAKE 1 TABLET AT ONSET OF FOR HEADACHE . MAY REPEAT 1 TIME 2 HOURS LATE IF NEEDED  10 tablet  2   No current facility-administered medications on file prior to visit.      Review of Systems Review of Systems  Constitutional: Negative for fever, appetite change, fatigue and unexpected weight change.  Eyes: Negative for pain and visual disturbance.  ENT pos for some rhinorrhea / neg for st  Respiratory: Negative for cough and shortness of breath.   Cardiovascular: Negative  for cp or palpitations    Gastrointestinal: Negative for nausea, diarrhea and constipation.  Genitourinary: Negative for urgency and frequency.  Skin: Negative for pallor or rash pos for cold sores on upper and lower lips   Neurological: Negative for weakness, light-headedness, numbness and headaches.  Hematological: Negative for adenopathy. Does not bruise/bleed easily.  Psychiatric/Behavioral: Negative for dysphoric mood. The patient is not nervous/anxious.         Objective:   Physical Exam  Constitutional: She appears well-developed and well-nourished. No distress.  HENT:  Head: Normocephalic and atraumatic.  Right Ear: External ear normal.  Left Ear: External ear normal.  Mouth/Throat: Oropharynx is clear and moist.  Nares are boggy with some clear rhinorrhea   Ulcer type lesions on upper and lower lips resembling cold sores noted   Eyes: Conjunctivae and EOM are normal. Pupils are equal, round, and reactive to light. Right eye exhibits no discharge. Left eye exhibits no discharge.  Neck: Normal range of motion. Neck supple.  Cardiovascular: Normal rate and regular rhythm.   Pulmonary/Chest: Effort normal. No respiratory distress. She has no wheezes. She has no rales.  Neurological: She is alert.  Skin: Skin is warm and dry. No rash noted.  Cold sores noted  Psychiatric: She has a normal mood and affect.          Assessment & Plan:

## 2013-07-12 NOTE — Patient Instructions (Signed)
Take the valtrex as needed for cold sores  If symptoms worsen let me know  Avoid sun exposure on lips  Try to get extra fluids and extra rest

## 2013-07-13 NOTE — Assessment & Plan Note (Signed)
That flare with viral illnesses Has failed abreeva  Will try valtrex 2g bid for a day- prn for lesions  Disc expectations of tx (not cure) Disc symptomatic care - see instructions on AVS Update if not starting to improve in a week or if worsening

## 2013-07-31 ENCOUNTER — Encounter: Payer: Self-pay | Admitting: Family Medicine

## 2013-07-31 ENCOUNTER — Ambulatory Visit (INDEPENDENT_AMBULATORY_CARE_PROVIDER_SITE_OTHER): Payer: BC Managed Care – PPO | Admitting: Family Medicine

## 2013-07-31 VITALS — BP 112/68 | HR 81 | Temp 98.2°F | Ht 62.5 in | Wt 152.8 lb

## 2013-07-31 DIAGNOSIS — J019 Acute sinusitis, unspecified: Secondary | ICD-10-CM

## 2013-07-31 DIAGNOSIS — B9689 Other specified bacterial agents as the cause of diseases classified elsewhere: Secondary | ICD-10-CM | POA: Insufficient documentation

## 2013-07-31 DIAGNOSIS — J029 Acute pharyngitis, unspecified: Secondary | ICD-10-CM

## 2013-07-31 LAB — POCT RAPID STREP A (OFFICE): Rapid Strep A Screen: NEGATIVE

## 2013-07-31 MED ORDER — AMOXICILLIN-POT CLAVULANATE 875-125 MG PO TABS: 1.0000 | ORAL_TABLET | Freq: Two times a day (BID) | ORAL | Status: DC

## 2013-07-31 NOTE — Progress Notes (Signed)
Subjective:    Patient ID: Debra Edwards, female    DOB: 1976/10/04, 37 y.o.   MRN: [id number]  HPI Here with a cold - started with cold sores early in the month  Got really tired and had uri symptoms  Kept going to bed early and drinking fluids  1 week of ST and few days of low grade fever  Now a little less tired   Sunday-glands hurt worse again and very tired  ? If she is getting something again  Is flying in less than a month  Nasal congestion and some sinus pain - all over face  D/c - thick/ post nasal drip  Cough -a little on and off with sore chest    Day quil otc  occ tylenol    Patient Active Problem List   Diagnosis Date Noted  . 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 Chest wall pain 12/25/2012  . Routine gynecological examination 11/07/2012  . Routine general medical examination at a health care facility 10/27/2012  . Migraine headache 06/24/2012  . Stress reaction 03/16/2012  . GERD (gastroesophageal reflux disease) 02/19/2012  . Infectious disease exposure 05/04/2011  . IBS (irritable bowel syndrome) 04/14/2011  . OBSTRUCTIVE SLEEP APNEA 09/05/2010  . BRUXISM 07/24/2010  . RESTLESS LEG SYNDROME 07/24/2010  . OTHER DYSPNEA AND RESPIRATORY ABNORMALITIES 07/08/2010  . ALLERGIC RHINITIS 12/17/2009  . HAND PAIN 06/11/2009  . NEOPLASM, SKIN, UNCERTAIN BEHAVIOR 05/22/2009  . PTSD 10/20/2007  . FIBROCYSTIC BREAST DISEASE 10/20/2007  . DYSMENORRHEA 10/20/2007   Past Medical History  Diagnosis Date  . PTSD (post-traumatic stress disorder)     with depressive symptoms  . Constipation, chronic   . Acid reflux     occasional  . Migraines   . 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 Heart murmur     Negative echo in the past  . Urticaria     stress induced  . History of chicken pox   . Anemia in the past    is a blood donor  . Allergic rhinitis, seasonal     vasomotor rhinitis  . Dysmenorrhea   .  OSA (obstructive sleep apnea)     PSG 08/20/10 RDI 18.7  . IBS (irritable bowel syndrome)    Past Surgical History  Procedure Laterality Date  . Wisdom tooth extraction     History  Substance Use Topics  . Smoking status: Never Smoker   . Smokeless tobacco: Never Used  . Alcohol Use: Yes     Comment: Rare   Family History  Problem Relation Age of Onset  . Hypertension Father   . Allergies Father   . Other Mother     Digestive problems-w/u @ [facility]-? bact inf  . Diabetes      GM  . Prostate cancer      GF  . Anxiety disorder      diffusely in family  . Depression      diffusely in family  . Colon cancer Neg Hx    No Known Allergies Current Outpatient Prescriptions on File Prior to Visit  Medication Sig Dispense Refill  . acetaminophen (TYLENOL) 500 MG tablet Take 500 mg by mouth every 6 (six) hours as needed. For headache      . buPROPion (WELLBUTRIN XL) 300 MG 24 hr tablet Take 300 mg by mouth daily.       . clonazePAM (KLONOPIN) 0.5 MG tablet Take  0.5 mg by mouth at bedtime.       . cyclobenzaprine (FLEXERIL) 10 MG tablet Take 1 tablet (10 mg total) by mouth 3 (three) times daily as needed for muscle spasms (and headache).  30 tablet  3  . escitalopram (LEXAPRO) 20 MG tablet Take 20 mg by mouth daily.      . fluticasone (FLONASE) 50 MCG/ACT nasal spray Place 2 sprays into the nose daily.  16 g  11  . levonorgestrel-ethinyl estradiol (AVIANE,ALESSE,LESSINA) 0.1-20 MG-MCG tablet Take 1 tablet by mouth daily.  1 Package  11  . Melatonin 3 MG TABS Take 3-6 mg by mouth at bedtime as needed. For sleep      . Multiple Vitamin (MULTIVITAMIN) tablet Take 1 tablet by mouth daily.        . Polyethylene Glycol 3350 (MIRALAX PO) Take by mouth daily.       . RELPAX 40 MG tablet TAKE 1 TABLET AT ONSET OF FOR HEADACHE . MAY REPEAT 1 TIME 2 HOURS LATE IF NEEDED  10 tablet  2  . valACYclovir (VALTREX) 1000 MG tablet Take 2 pills by mouth twice daily for one day at first sign of a  cold sore  12 tablet  3   No current facility-administered medications on file prior to visit.    Review of Systems    Review of Systems  Constitutional: Negative for , appetite change,  and unexpected weight change.  ENT pos for cong/sinus pain / ear plugging  Eyes: Negative for pain and visual disturbance.  Respiratory: Negative for wheeze  and shortness of breath.   Cardiovascular: Negative for cp or palpitations    Gastrointestinal: Negative for nausea, diarrhea and constipation.  Genitourinary: Negative for urgency and frequency.  Skin: Negative for pallor or rash   Neurological: Negative for weakness, light-headedness, numbness and headaches.  Hematological: Negative for adenopathy. Does not bruise/bleed easily.  Psychiatric/Behavioral: Negative for dysphoric mood. The patient is not nervous/anxious.      Objective:   Physical Exam  Constitutional: She appears well-developed and well-nourished. No distress.  HENT:  Head: Normocephalic and atraumatic.  Right Ear: External ear normal.  Left Ear: External ear normal.  Mouth/Throat: Oropharynx is clear and moist. No oropharyngeal exudate.  Nares are injected and congested  bilat frontal and maxillary sinus tenderness Tms are dull  Eyes: Conjunctivae and EOM are normal. Pupils are equal, round, and reactive to light. Right eye exhibits no discharge. Left eye exhibits no discharge.  Neck: Normal range of motion. Neck supple.  Cardiovascular: Normal rate and regular rhythm.   Pulmonary/Chest: Effort normal and breath sounds normal. No respiratory distress. She has no wheezes. She has no rales.  Lymphadenopathy:    She has no cervical adenopathy.  Neurological: She is alert.  Skin: Skin is warm and dry. No rash noted.  Psychiatric: She has a normal mood and affect.          Assessment & Plan:

## 2013-07-31 NOTE — Patient Instructions (Signed)
Drink lots of fluids Nasal saline or netti pot for congestion Take augmentin for sinus infection as directed  Update if not starting to improve in a week or if worsening

## 2013-07-31 NOTE — Assessment & Plan Note (Signed)
S/p uri  Cover with augmentin Disc symptomatic care - see instructions on AVS  Update if not starting to improve in a week or if worsening

## 2013-08-07 ENCOUNTER — Telehealth: Payer: Self-pay

## 2013-08-07 NOTE — Telephone Encounter (Signed)
Pt notified of Dr. Royden Purl comments and verbalized understanding, pt is just worried that her glands are still swollen I advise to finish abx and if no improvement call us back

## 2013-08-07 NOTE — Telephone Encounter (Signed)
Thanks for the update- these kind of infections can take quite a while to clear Finish the abx - then if still having facial pain call back - we may want to check CT of the sinuses

## 2013-08-07 NOTE — Telephone Encounter (Signed)
Pt left v/m;pt seen 07/31/13; pt has 2 days of Augmentin left; pt does feels some better, has made progress but not well but concerned when finishes antibiotic will pt continue to improve.; pt said no fever now, but still has sinus pressure and glands are still hurting.Pt using saline wash 2-3 times daily. When pt exercised pt feels worse.Please advise. Pt request cb.

## 2013-08-10 ENCOUNTER — Other Ambulatory Visit: Payer: Self-pay

## 2013-09-22 ENCOUNTER — Telehealth: Payer: Self-pay

## 2013-09-22 MED ORDER — HYOSCYAMINE SULFATE ER 0.375 MG PO TB12: 0.3750 mg | ORAL_TABLET | Freq: Two times a day (BID) | ORAL | Status: DC

## 2013-09-22 NOTE — Telephone Encounter (Signed)
Pt left v/m; pt having IBS symptoms, gas pains and needing to expel gas and pt is to flying out tonight and pt wants to know what pt can take OTC with Gas X to get thru the flight tonight.pt request cb.

## 2013-09-22 NOTE — Telephone Encounter (Signed)
Gas ex is fine - can also call in levbid for cramping if she needs it -- Px written for call in   Avoid all carbonation whatsoever -this can cause more gas/ also eat and drink slowly

## 2013-09-25 NOTE — Telephone Encounter (Signed)
Left voicemail letting pt know Gas ex is okay to take but if she needs [place] to call in levbid for cramping to call office back and let us know

## 2013-09-28 ENCOUNTER — Other Ambulatory Visit: Payer: Self-pay | Admitting: Family Medicine

## 2013-09-29 NOTE — Telephone Encounter (Signed)
Pt never called and requested the levbid so Rx cancelled/never called in

## 2013-09-29 NOTE — Addendum Note (Signed)
Addended by: Shon Millet on: 09/29/2013 03:24 PM   Modules accepted: Orders, Medications

## 2013-11-15 ENCOUNTER — Telehealth: Payer: Self-pay | Admitting: Family Medicine

## 2013-11-15 ENCOUNTER — Other Ambulatory Visit (INDEPENDENT_AMBULATORY_CARE_PROVIDER_SITE_OTHER): Payer: BC Managed Care – PPO

## 2013-11-15 DIAGNOSIS — Z0000 Encounter for general adult medical examination without abnormal findings: Secondary | ICD-10-CM

## 2013-11-15 LAB — CBC WITH DIFFERENTIAL/PLATELET
BASOS PCT: 0.2 % (ref 0.0–3.0)
Basophils Absolute: 0 10*3/uL (ref 0.0–0.1)
EOS ABS: 0.1 10*3/uL (ref 0.0–0.7)
Eosinophils Relative: 2.2 % (ref 0.0–5.0)
HCT: 39 % (ref 36.0–46.0)
Hemoglobin: 13 g/dL (ref 12.0–15.0)
LYMPHS PCT: 34.6 % (ref 12.0–46.0)
Lymphs Abs: 1.5 10*3/uL (ref 0.7–4.0)
MCHC: 33.4 g/dL (ref 30.0–36.0)
MCV: 92.4 fl (ref 78.0–100.0)
Monocytes Absolute: 0.3 10*3/uL (ref 0.1–1.0)
Monocytes Relative: 6.1 % (ref 3.0–12.0)
NEUTROS PCT: 56.9 % (ref 43.0–77.0)
Neutro Abs: 2.4 10*3/uL (ref 1.4–7.7)
Platelets: 182 10*3/uL (ref 150.0–400.0)
RBC: 4.22 Mil/uL (ref 3.87–5.11)
RDW: 13 % (ref 11.5–14.6)
WBC: 4.3 10*3/uL — ABNORMAL LOW (ref 4.5–10.5)

## 2013-11-15 LAB — COMPREHENSIVE METABOLIC PANEL
ALBUMIN: 3.7 g/dL (ref 3.5–5.2)
ALT: 17 U/L (ref 0–35)
AST: 19 U/L (ref 0–37)
Alkaline Phosphatase: 32 U/L — ABNORMAL LOW (ref 39–117)
BUN: 12 mg/dL (ref 6–23)
CALCIUM: 8.8 mg/dL (ref 8.4–10.5)
CHLORIDE: 105 meq/L (ref 96–112)
CO2: 29 mEq/L (ref 19–32)
Creatinine, Ser: 0.9 mg/dL (ref 0.4–1.2)
GFR: 77.36 mL/min (ref >60.00)
Glucose, Bld: 85 mg/dL (ref 70–99)
POTASSIUM: 3.9 meq/L (ref 3.5–5.1)
SODIUM: 140 meq/L (ref 135–145)
TOTAL PROTEIN: 6.6 g/dL (ref 6.0–8.3)
Total Bilirubin: 0.5 mg/dL (ref 0.3–1.2)

## 2013-11-15 LAB — LIPID PANEL
CHOL/HDL RATIO: 2
Cholesterol: 151 mg/dL (ref 0–200)
HDL: 63 mg/dL (ref >39.00)
LDL CALC: 73 mg/dL (ref 0–99)
Triglycerides: 74 mg/dL (ref 0.0–149.0)
VLDL: 14.8 mg/dL (ref 0.0–40.0)

## 2013-11-15 LAB — TSH: TSH: 1.89 u[IU]/mL (ref 0.35–5.50)

## 2013-11-15 NOTE — Telephone Encounter (Signed)
Message copied by Abner Greenspan on Wed Nov 15, 2013  7:28 AM ------      [name]      Created: Wed Nov 08, 2013  5:57 PM      Regarding: Lab orders for Wednesday, 2.11.15       Patient is scheduled for CPX labs, please order future labs, Thanks , Debra Edwards       ------

## 2013-11-17 ENCOUNTER — Telehealth: Payer: Self-pay | Admitting: Pulmonary Disease

## 2013-11-17 NOTE — Telephone Encounter (Signed)
Pt didn't respond to LM to schedule next appt w/ [facility].  Sent letter.  Nothing further needed at this time.  Satira Anis

## 2013-11-22 ENCOUNTER — Encounter: Payer: BC Managed Care – PPO | Admitting: Family Medicine

## 2013-11-29 ENCOUNTER — Other Ambulatory Visit: Payer: Self-pay | Admitting: Family Medicine

## 2013-12-06 ENCOUNTER — Ambulatory Visit (INDEPENDENT_AMBULATORY_CARE_PROVIDER_SITE_OTHER): Payer: BC Managed Care – PPO | Admitting: Family Medicine

## 2013-12-06 ENCOUNTER — Encounter: Payer: Self-pay | Admitting: Family Medicine

## 2013-12-06 VITALS — BP 110/68 | HR 77 | Temp 98.3°F | Ht 62.25 in | Wt 153.5 lb

## 2013-12-06 DIAGNOSIS — L738 Other specified follicular disorders: Secondary | ICD-10-CM

## 2013-12-06 DIAGNOSIS — L739 Follicular disorder, unspecified: Secondary | ICD-10-CM

## 2013-12-06 DIAGNOSIS — Z0000 Encounter for general adult medical examination without abnormal findings: Secondary | ICD-10-CM

## 2013-12-06 DIAGNOSIS — N946 Dysmenorrhea, unspecified: Secondary | ICD-10-CM

## 2013-12-06 MED ORDER — LEVONORGESTREL-ETHINYL ESTRAD 0.1-20 MG-MCG PO TABS: ORAL_TABLET | ORAL | Status: DC

## 2013-12-06 MED ORDER — CYCLOBENZAPRINE HCL 10 MG PO TABS: 10.0000 mg | ORAL_TABLET | Freq: Three times a day (TID) | ORAL | Status: DC | PRN

## 2013-12-06 NOTE — Progress Notes (Signed)
Pre visit review using our clinic review tool, if applicable. No additional management support is needed unless otherwise documented below in the visit note. 

## 2013-12-06 NOTE — Progress Notes (Signed)
Subjective:    Patient ID: Debra Edwards, female    DOB: 09/24/1976, 38 y.o.   MRN: [id number]  HPI Here for health maintenance exam and to review chronic medical problems  Is doing well overall   Had a rough time getting over a sinus infection in the fall- was exhausted for a few months  Had another uri last mo  Doing well now    Wt is up 1 lb with bmi of 27  Flu vaccine - had it in the fall   Pap 2/14 Nl  On OC - orsythia - does fairly well - occ has some breakthrough bleeding the week before her period  Overall cramping and fatigue and flow has decreased  She really tries to take it the same time every day No abn paps  No new sexual partners  Is not high risk for HPV   She is sexually active at the current time    Headaches are fairly well controlled  Needs a refill of flexeril for headaches to keep on hand    Td 2/12   Results for orders placed in visit on 11/15/13  CBC WITH DIFFERENTIAL      Result Value Ref Range   WBC 4.3 (*) 4.5 - 10.5 K/uL   RBC 4.22  3.87 - 5.11 Mil/uL   Hemoglobin 13.0  12.0 - 15.0 g/dL   HCT 39.0  36.0 - 46.0 %   MCV 92.4  78.0 - 100.0 fl   MCHC 33.4  30.0 - 36.0 g/dL   RDW 13.0  11.5 - 14.6 %   Platelets 182.0  150.0 - 400.0 K/uL   Neutrophils Relative % 56.9  43.0 - 77.0 %   Lymphocytes Relative 34.6  12.0 - 46.0 %   Monocytes Relative 6.1  3.0 - 12.0 %   Eosinophils Relative 2.2  0.0 - 5.0 %   Basophils Relative 0.2  0.0 - 3.0 %   Neutro Abs 2.4  1.4 - 7.7 K/uL   Lymphs Abs 1.5  0.7 - 4.0 K/uL   Monocytes Absolute 0.3  0.1 - 1.0 K/uL   Eosinophils Absolute 0.1  0.0 - 0.7 K/uL   Basophils Absolute 0.0  0.0 - 0.1 K/uL  COMPREHENSIVE METABOLIC PANEL      Result Value Ref Range   Sodium 140  135 - 145 mEq/L   Potassium 3.9  3.5 - 5.1 mEq/L   Chloride 105  96 - 112 mEq/L   CO2 29  19 - 32 mEq/L   Glucose, Bld 85  70 - 99 mg/dL   BUN 12  6 - 23 mg/dL   Creatinine, Ser 0.9  0.4 - 1.2 mg/dL   Total Bilirubin 0.5  0.3 -  1.2 mg/dL   Alkaline Phosphatase 32 (*) 39 - 117 U/L   AST 19  0 - 37 U/L   ALT 17  0 - 35 U/L   Total Protein 6.6  6.0 - 8.3 g/dL   Albumin 3.7  3.5 - 5.2 g/dL   Calcium 8.8  8.4 - 10.5 mg/dL   GFR 77.36  >60.00 mL/min  LIPID PANEL      Result Value Ref Range   Cholesterol 151  0 - 200 mg/dL   Triglycerides 74.0  0.0 - 149.0 mg/dL   HDL 63.00  >39.00 mg/dL   VLDL 14.8  0.0 - 40.0 mg/dL   LDL Cholesterol 73  0 - 99 mg/dL   Total CHOL/HDL Ratio 2  TSH      Result Value Ref Range   TSH 1.89  0.35 - 5.50 uIU/mL   labs look great   Exercise- had to quit the gym after she got sick- now ready to get back  Walking and hiking now - had a great trip to [place] and went skiing   Has bumps on legs that sometimes itch- usually mid to upper legs  She has tried to change products - and avoids harsh detergents   Occ palpitations on caffeine  Does better in the summer   Patient Active Problem List   Diagnosis Date Noted  . 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 Chest wall pain 12/25/2012  . Routine gynecological examination 11/07/2012  . Routine general medical examination at a health care facility 10/27/2012  . Migraine headache 06/24/2012  . Stress reaction 03/16/2012  . GERD (gastroesophageal reflux disease) 02/19/2012  . Infectious disease exposure 05/04/2011  . IBS (irritable bowel syndrome) 04/14/2011  . OBSTRUCTIVE SLEEP APNEA 09/05/2010  . BRUXISM 07/24/2010  . RESTLESS LEG SYNDROME 07/24/2010  . OTHER DYSPNEA AND RESPIRATORY ABNORMALITIES 07/08/2010  . ALLERGIC RHINITIS 12/17/2009  . HAND PAIN 06/11/2009  . NEOPLASM, SKIN, UNCERTAIN BEHAVIOR 40/98/1191  . PTSD 10/20/2007  . FIBROCYSTIC BREAST DISEASE 10/20/2007  . DYSMENORRHEA 10/20/2007   Past Medical History  Diagnosis Date  . PTSD (post-traumatic stress disorder)     with depressive symptoms  . Constipation, chronic   . Acid reflux     occasional  . Migraines   . 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 Heart murmur     Negative echo in the past  . Urticaria     stress induced  . History of chicken pox   . Anemia in the past    is a blood donor  . Allergic rhinitis, seasonal     vasomotor rhinitis  . Dysmenorrhea   . OSA (obstructive sleep apnea)     PSG 08/20/10 RDI 18.7  . IBS (irritable bowel syndrome)    Past Surgical History  Procedure Laterality Date  . Wisdom tooth extraction     History  Substance Use Topics  . Smoking status: Never Smoker   . Smokeless tobacco: Never Used  . Alcohol Use: Yes     Comment: Rare   Family History  Problem Relation Age of Onset  . Hypertension Father   . Allergies Father   . Other Mother     Digestive problems-w/u @ [facility]-? bact inf  . Diabetes      GM  . Prostate cancer      GF  . Anxiety disorder      diffusely in family  . Depression      diffusely in family  . Colon cancer Neg Hx    No Known Allergies Current Outpatient Prescriptions on File Prior to Visit  Medication Sig Dispense Refill  . acetaminophen (TYLENOL) 500 MG tablet Take 500 mg by mouth every 6 (six) hours as needed. For headache      . buPROPion (WELLBUTRIN XL) 300 MG 24 hr tablet Take 300 mg by mouth daily.       . clonazePAM (KLONOPIN) 0.5 MG tablet Take 0.5 mg by mouth at bedtime.       . cyclobenzaprine (FLEXERIL) 10 MG tablet Take 1 tablet (10 mg total) by mouth 3 (three) times daily as needed for muscle spasms (and headache).  Champ  tablet  3  . escitalopram (LEXAPRO) 20 MG tablet Take 20 mg by mouth daily.      . fluticasone (FLONASE) 50 MCG/ACT nasal spray USE 2 SPRAYS IN EACH NOSTRIL ONCE DAILY  16 g  5  . Melatonin 3 MG TABS Take 3-6 mg by mouth at bedtime as needed. For sleep      . Multiple Vitamin (MULTIVITAMIN) tablet Take 1 tablet by mouth daily.        . ORSYTHIA 0.1-20 MG-MCG tablet TAKE 1 TABLET EVERY DAY AS DIRECTED  28 tablet  5  . Polyethylene Glycol 3350 (MIRALAX PO) Take by mouth daily.        . RELPAX 40 MG tablet TAKE 1 TABLET AT ONSET OF FOR HEADACHE . MAY REPEAT 1 TIME 2 HOURS LATE IF NEEDED  10 tablet  2  . valACYclovir (VALTREX) 1000 MG tablet Take 2 pills by mouth twice daily for one day at first sign of a cold sore  12 tablet  3  . hyoscyamine (LEVSIN, ANASPAZ) 0.125 MG tablet Take 1 tablet (0.125 mg total) by mouth every 6 (six) hours as needed for cramping.  30 tablet  2   No current facility-administered medications on file prior to visit.      Review of Systems Review of Systems  Constitutional: Negative for fever, appetite change, fatigue and unexpected weight change.  Eyes: Negative for pain and visual disturbance.  Respiratory: Negative for cough and shortness of breath.   Cardiovascular: Negative for cp or palpitations    Gastrointestinal: Negative for nausea, diarrhea and constipation.  Genitourinary: Negative for urgency and frequency.  Skin: Negative for pallor and pos for ongoing rash on legs -with bumps that sometimes itch Neurological: Negative for weakness, light-headedness, numbness and headaches.  Hematological: Negative for adenopathy. Does not bruise/bleed easily.  Psychiatric/Behavioral: Negative for dysphoric mood. The patient is not nervous/anxious.         Objective:   Physical Exam  Constitutional: She appears well-developed and well-nourished. No distress.  HENT:  Head: Normocephalic and atraumatic.  Right Ear: External ear normal.  Left Ear: External ear normal.  Mouth/Throat: Oropharynx is clear and moist.  Eyes: Conjunctivae and EOM are normal. Pupils are equal, round, and reactive to light. No scleral icterus.  Neck: Normal range of motion. Neck supple. No JVD present. Carotid bruit is not present. No thyromegaly present.  Cardiovascular: Normal rate, regular rhythm, normal heart sounds and intact distal pulses.  Exam reveals no gallop.   Pulmonary/Chest: Effort normal and breath sounds normal. No respiratory distress. She has no  wheezes. She exhibits no tenderness.  Abdominal: Soft. Bowel sounds are normal. She exhibits no distension, no abdominal bruit and no mass. There is no tenderness.  Genitourinary: No breast swelling, tenderness, discharge or bleeding. There is no rash, tenderness or lesion on the right labia. There is no rash, tenderness or lesion on the left labia. Uterus is not enlarged and not tender. Cervix exhibits no motion tenderness, no discharge and no friability. Right adnexum displays no mass, no tenderness and no fullness. Left adnexum displays no mass, no tenderness and no fullness. No erythema or bleeding around the vagina.  Breast exam: No mass, nodules, thickening, tenderness, bulging, retraction, inflamation, nipple discharge or skin changes noted.  No axillary or clavicular LA.  Chaperoned exam.    Musculoskeletal: Normal range of motion. She exhibits no edema and no tenderness.  Lymphadenopathy:    She has no cervical adenopathy.  Neurological: She is alert. She  has normal reflexes. No cranial nerve deficit. She exhibits normal muscle tone. Coordination normal.  Skin: Skin is warm and dry. No rash noted. No erythema. No pallor.  Psychiatric: She has a normal mood and affect.          Assessment & Plan:

## 2013-12-06 NOTE — Patient Instructions (Signed)
Take care of yourself  Use dove soap for sensitive skin on legs and avoid harsh detergents and fabric softeners  Use a moisturizer (mix in some otc hydrocortisone cream)- and if not improved let me know  Shave infrequently and when you do - use a brand new blade every time Get back to exercise  Labs look good

## 2013-12-07 NOTE — Assessment & Plan Note (Signed)
Mild-on legs  See AVS for advisement- clean razor/ infrequent shaving/ otc cortisone cream prn and update if no imp

## 2013-12-07 NOTE — Assessment & Plan Note (Signed)
Pt wants to continue on orsythia - however if breakthrough bleeding becomes more of a problem we may need to change it

## 2013-12-07 NOTE — Assessment & Plan Note (Signed)
Reviewed health habits including diet and exercise and skin cancer prevention Reviewed appropriate screening tests for age  Also reviewed health mt list, fam hx and immunization status , as well as social and family history    Labs reviewed  Disc self care

## 2013-12-29 ENCOUNTER — Telehealth: Payer: Self-pay | Admitting: Family Medicine

## 2013-12-29 NOTE — Telephone Encounter (Signed)
If no appts avail-please offer her sat clinic appt-thanks

## 2013-12-29 NOTE — Telephone Encounter (Signed)
Left voicemail requesting pt to call office 

## 2013-12-29 NOTE — Telephone Encounter (Signed)
Patient Information:  Caller Name: Merrick  Phone: [phone]  Patient: Debra Edwards, Debra Edwards  Gender: Female  DOB: 1976/05/14  Age: 38 Years  PCP: Loura Pardon [facility])  Pregnant: No  Office Follow Up:  Does the office need to follow up with this patient?: Yes  Instructions For The Office: Calling about low grade fever x 4 days,cough,nasal congestion. No appts available for today. Please follow-up with pt since she has been running a low grade fever x 4 days.  RN Note:  No appts available at the office. Advised pt that a message would be sent to the office and someone would follow-up up with her since there were no appts. Home care advice given.  Symptoms  Reason For Call & Symptoms: Calling about low grade fever x 4 days,cough,nasal congestion  Reviewed Health History In EMR: Yes  Reviewed Medications In EMR: Yes  Reviewed Allergies In EMR: Yes  Reviewed Surgeries / Procedures: Yes  Date of Onset of Symptoms: 12/26/2013  Treatments Tried: Nyquil  Treatments Tried Worked: Yes  Any Fever: Yes  Fever Taken: Oral  Fever Time Of Reading: 11:00:00  Fever Last Reading: 99.5 OB / GYN:  LMP: 12/15/2013  Guideline(s) Used:  Colds  Disposition Per Guideline:   See Today in Office  Reason For Disposition Reached:   Fever present > 3 days (72 hours)  Advice Given:  Reassurance  It sounds like an uncomplicated cold that we can treat at home.  Colds are caused by viruses, and no medicine or "shot" will cure an uncomplicated cold.  Colds are usually not serious.  For a Stuffy Nose - Use Nasal Washes:  Introduction: Saline (salt water) nasal irrigation (nasal wash) is an effective and simple home remedy for treating stuffy nose and sinus congestion. The nose can be irrigated by pouring, spraying, or squirting salt water into the nose and then letting it run back out.  Treatment for Associated Symptoms of Colds:  For muscle aches, headaches, or moderate fever (more than  101 F or 38.9 C): Take acetaminophen every 4 hours.  Cough: Use cough drops.  Hydrate: Drink adequate liquids.  Humidifier:  If the air in your home is dry, use a cool-mist humidifier  Expected Course:   Fever may last 2-3 days  Nasal discharge 7-14 days  Cough up to 2-3 weeks.  Call Back If:  Difficulty breathing occurs  You become worse  Patient Will Follow Care Advice:  YES

## 2014-01-04 ENCOUNTER — Ambulatory Visit (INDEPENDENT_AMBULATORY_CARE_PROVIDER_SITE_OTHER): Payer: BC Managed Care – PPO | Admitting: Internal Medicine

## 2014-01-04 ENCOUNTER — Encounter: Payer: Self-pay | Admitting: Internal Medicine

## 2014-01-04 VITALS — BP 116/72 | HR 74 | Temp 98.1°F | Wt 158.0 lb

## 2014-01-04 DIAGNOSIS — J309 Allergic rhinitis, unspecified: Secondary | ICD-10-CM

## 2014-01-04 NOTE — Progress Notes (Signed)
Pre visit review using our clinic review tool, if applicable. No additional management support is needed unless otherwise documented below in the visit note. 

## 2014-01-04 NOTE — Progress Notes (Signed)
HPI  Pt presents to the clinic today with c/o sore throat, post nasal drainage, headache and fatigue. She reports this started 1 week ago. She has also noticed some swelling in her glands and difficulty swallowing. She has taken OTC Dayquil with some relief. She did have a cold for 4 days prior to these symptoms. She does have a history of allergies but denies breathing problems.  Review of Systems    Past Medical History  Diagnosis Date  . PTSD (post-traumatic stress disorder)     with depressive symptoms  . Constipation, chronic   . Acid reflux     occasional  . Migraines   . 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 Heart murmur     Negative echo in the past  . Urticaria     stress induced  . History of chicken pox   . Anemia in the past    is a blood donor  . Allergic rhinitis, seasonal     vasomotor rhinitis  . Dysmenorrhea   . OSA (obstructive sleep apnea)     PSG 08/20/10 RDI 18.7  . IBS (irritable bowel syndrome)     Family History  Problem Relation Age of Onset  . Hypertension Father   . Allergies Father   . Other Mother     Digestive problems-w/u @ [facility]-? bact inf  . Diabetes      GM  . Prostate cancer      GF  . Anxiety disorder      diffusely in family  . Depression      diffusely in family  . Colon cancer Neg Hx     History   Social History  . Marital Status: Single    Spouse Name: N/A    Number of Children: N/A  . Years of Education: N/A   Occupational History  . School Counselor    Social History Main Topics  . Smoking status: Never Smoker   . Smokeless tobacco: Never Used  . Alcohol Use: Yes     Comment: Rare  . Drug Use: No  . Sexual Activity: Not on file   Other Topics Concern  . Not on file   Social History Narrative   Grad school at [employer]      Now a counselor in a middle school      Has cats and involved in cat rescue      [id number]      Lives alone      Has run triathlon in the past      Vegetarian    No Known Allergies   Constitutional: Positive headache, fatigue. Denies fever or abrupt weight changes.  HEENT:  Positive nasal congestion and sore throat. Denies eye redness, ear pain, ringing in the ears, wax buildup, runny nose or bloody nose. Respiratory: Positive cough. Denies difficulty breathing or shortness of breath.  Cardiovascular: Denies chest pain, chest tightness, palpitations or swelling in the hands or feet.   No other specific complaints in a complete review of systems (except as listed in HPI above).  Objective:    General: Appears her stated age, well developed, well nourished in NAD. HEENT: Head: normal shape and size, sinus tenderness noted; Eyes: sclera white, no icterus, conjunctiva pink, PERRLA and EOMs intact; Ears: Tm's gray and intact, normal light reflex; Nose: mucosa pink and moist, septum midline; Throat/Mouth: + PND. Teeth present, mucosa pink and  moist, no exudate noted, no lesions or ulcerations noted.  Neck: Neck supple, trachea midline. No massses, lumps or thyromegaly present.  Cardiovascular: Normal rate and rhythm. S1,S2 noted.  No murmur, rubs or gallops noted. No JVD or BLE edema. No carotid bruits noted. Pulmonary/Chest: Normal effort and positive vesicular breath sounds. No respiratory distress. No wheezes, rales or ronchi noted.      Assessment & Plan:   Allergic Rhinitis  Continue flonase Try some zyrtec OTC 80 mg Depo IM today  RTC as needed or if symptoms persist.

## 2014-01-04 NOTE — Patient Instructions (Addendum)
Swollen Lymph Nodes  The lymphatic system filters fluid from around cells. It is like a system of blood vessels. These channels carry lymph instead of blood. The lymphatic system is an important part of the immune (disease fighting) system. When people talk about "swollen glands in the neck," they are usually talking about swollen lymph nodes. The lymph nodes are like the little traps for infection. You and your caregiver may be able to feel lymph nodes, especially swollen nodes, in these common areas: the groin (inguinal area), armpits (axilla), and above the clavicle (supraclavicular). You may also feel them in the neck (cervical) and the back of the head just above the hairline (occipital).  Swollen glands occur when there is any condition in which the body responds with an allergic type of reaction. For instance, the glands in the neck can become swollen from insect bites or any type of minor infection on the head. These are very noticeable in children with only minor problems. Lymph nodes may also become swollen when there is a tumor or problem with the lymphatic system, such as Hodgkin's disease.  TREATMENT   · Most swollen glands do not require treatment. They can be observed (watched) for a short period of time, if your caregiver feels it is necessary. Most of the time, observation is not necessary.  · Antibiotics (medicines that kill germs) may be prescribed by your caregiver. Your caregiver may prescribe these if he or she feels the swollen glands are due to a bacterial (germ) infection. Antibiotics are not used if the swollen glands are caused by a virus.  HOME CARE INSTRUCTIONS   · Take medications as directed by your caregiver. Only take over-the-counter or prescription medicines for pain, discomfort, or fever as directed by your caregiver.  SEEK MEDICAL CARE IF:   · If you begin to run a temperature greater than 102° F (38.9° C), or as your caregiver suggests.  MAKE SURE YOU:   · Understand these  instructions.  · Will watch your condition.  · Will get help right away if you are not doing well or get worse.  Document Released: 09/11/2002 Document Revised: 12/14/2011 Document Reviewed: 09/21/2005  ExitCare® Patient Information ©2014 [employer].

## 2014-01-08 MED ORDER — METHYLPREDNISOLONE ACETATE 80 MG/ML IJ SUSP
80.0000 mg | Freq: Once | INTRAMUSCULAR | Status: AC
  Administered 2014-01-08: 80 mg via INTRAMUSCULAR

## 2014-01-08 NOTE — Telephone Encounter (Signed)
Pt was seen on 01/04/14 by Webb Silversmith, NP

## 2014-01-08 NOTE — Addendum Note (Signed)
Addended by: Lurlean Nanny on: 01/08/2014 10:27 AM   Modules accepted: Orders

## 2014-05-07 ENCOUNTER — Encounter: Payer: Self-pay | Admitting: Family Medicine

## 2014-05-07 ENCOUNTER — Ambulatory Visit (INDEPENDENT_AMBULATORY_CARE_PROVIDER_SITE_OTHER): Payer: BC Managed Care – PPO | Admitting: Family Medicine

## 2014-05-07 VITALS — BP 122/66 | HR 83 | Temp 98.4°F | Ht 62.25 in | Wt 170.0 lb

## 2014-05-07 DIAGNOSIS — K589 Irritable bowel syndrome without diarrhea: Secondary | ICD-10-CM

## 2014-05-07 MED ORDER — HYOSCYAMINE SULFATE 0.125 MG PO TABS: 0.1250 mg | ORAL_TABLET | Freq: Four times a day (QID) | ORAL | Status: DC | PRN
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
## 2014-05-07 NOTE — Progress Notes (Signed)
Pre visit review using our clinic review tool, if applicable. No additional management support is needed unless otherwise documented below in the visit note. 
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
## 2014-05-07 NOTE — Patient Instructions (Signed)
Do take a fiber supplement daily (like metamucil or citrucel) in addition to your high fiber diet  Also try your levsin when needed  Update me if no improvement or worse  Make sure to keep up with fluids

## 2014-10-02 ENCOUNTER — Telehealth: Payer: Self-pay

## 2014-10-02 NOTE — Telephone Encounter (Signed)
Agree with advisement  Seen this on paper already  If not improved - f/u

## 2014-10-02 NOTE — Telephone Encounter (Signed)
PLEASE NOTE: All timestamps contained within this report are represented as [place] Standard Time. CONFIDENTIALTY NOTICE: This fax transmission is intended only for the addressee. It contains information that is legally privileged, confidential or otherwise protected from use or disclosure. If you are not the intended recipient, you are strictly prohibited from reviewing, disclosing, copying using or disseminating any of this information or taking any action in reliance on or regarding this information. If you have received this fax in error, please notify us immediately by telephone so that we can arrange for its return to [place]. Phone: [phone], Toll-Free: [phone], Fax: [phone] Page: 1 of 2 Call Id: [id number] [place] Patient Name: Debra Edwards Gender: Female DOB: 1976/06/25 Age: 38 Y 1 M 3 D Return Phone Number: [phone] (Primary) [place] [address] Client [facility] Client Client Site [place], [place] Contact Type Call Call Type Triage / Clinical Relationship To Patient Self Return Phone Number [phone] (Primary) Chief Complaint Diarrhea Initial Comment Caller states she had cramping and diarrhea on Christmas, [facility] Sat, diagnosed as possible virus, cramping has improved but still has diarrhea, xfer from office for triage PreDisposition Call Doctor Nurse Assessment Nurse: Myrle Sheng, RN, Larene Beach Date/Time Eilene Ghazi Time): 10/01/2014 3:03:11 PM Confirm and document reason for call. If symptomatic, describe symptoms. ---Caller states she had cramping and diarrhea on Christmas, went to [facility] Sat, diagnosed as possible virus, cramping has improved but still has diarrhea, she is able to eat Jello. They told her to stay on liquid diet. She did have a low grade fever for a  couple days but it was gone by Sunday am. Has had nausea but has not vomited. Has the patient traveled out of the country within the last 30 days? ---Not Applicable Does the patient require triage? ---Yes Related visit to physician within the last 2 weeks? ---Yes Does the PT have any chronic conditions? (i.e. diabetes, asthma, etc.) ---Yes List chronic conditions. ---IBS Did the patient indicate they were pregnant? ---No Guidelines Guideline Title Affirmed Question Affirmed Notes Nurse Date/Time Eilene Ghazi Time) Diarrhea Mild diarrhea (all triage questions negative) Myrle Sheng, RN, Larene Beach 10/01/2014 3:06:09 PM Disp. Time Eilene Ghazi Time) Disposition Final User 10/01/2014 3:12:26 PM Home Care Yes Myrle Sheng, RN, Larene Beach PLEASE NOTE: All timestamps contained within this report are represented as [place] Standard Time. CONFIDENTIALTY NOTICE: This fax transmission is intended only for the addressee. It contains information that is legally privileged, confidential or otherwise protected from use or disclosure. If you are not the intended recipient, you are strictly prohibited from reviewing, disclosing, copying using or disseminating any of this information or taking any action in reliance on or regarding this information. If you have received this fax in error, please notify us immediately by telephone so that we can arrange for its return to [place]. Phone: [phone], Toll-Free: [phone], Fax: [phone] Page: 2 of 2 Call Id: [id number] Caller Understands: Yes Disagree/Comply: Comply Care Advice Given Per Guideline HOME CARE: You should be able to treat this at home. REASSURANCE: * In healthy adults, most new onset diarrhea is caused by a viral infection of the intestines. * Diarrhea is the body's way of getting rid of the germs. FLUID THERAPY DURING MILDMODERATE DIARRHEA: * Drink more fluids, at least 8-10 glasses (8 oz) daily. * For example: sports drinks, diluted fruit juices, soft drinks. *  Supplement this with saltine  crackers or soups, to make certain that you are getting sufficient fluid and salt to meet your body's needs. * Avoid caffeinated beverages (Reason: caffeine is mildly dehydrating). NUTRITION DURING MILD-MODERATE DIARRHEA * Maintaining some food intake during episodes of diarrhea is important. * Ideal initial foods include boiled starches / cereals (e.g., potatoes, rice, noodles, wheat, oats) with a small amount of salt to taste. * Other acceptable foods include: bananas, yogurt, crackers, soup. * As your stools return to normal consistency, resume a normal diet. EXPECTED COURSE: Viral diarrhea lasts 4-7 days. Always worse on days 1 and 2. CALL BACK IF: * Diarrhea persists over 7 days * Signs of dehydration occur (e.g., no urine over 12 hours, very dry mouth, lightheaded, etc.) * You become worse. CARE ADVICE given per Diarrhea (Adult) guideline. After Care Instructions Given Call Event Type User Date / Time Description

## 2014-10-02 NOTE — Telephone Encounter (Signed)
Pt aware.

## 2014-11-26 ENCOUNTER — Other Ambulatory Visit: Payer: Self-pay | Admitting: Family Medicine

## 2014-12-03 ENCOUNTER — Other Ambulatory Visit: Payer: Self-pay | Admitting: *Deleted

## 2014-12-03 MED ORDER — VALACYCLOVIR HCL 1 G PO TABS
ORAL_TABLET | ORAL | Status: DC
Start: 2014-12-03 — End: 2016-01-13

## 2014-12-03 NOTE — Telephone Encounter (Signed)
Please refill times 3 

## 2014-12-03 NOTE — Telephone Encounter (Addendum)
Received faxed refill request from [facility] to refill? Last filled on 10/02/13. Last seen on 05/07/14. CPE scheduled on 12/19/14.

## 2014-12-03 NOTE — Telephone Encounter (Signed)
rx refilled.

## 2014-12-19 ENCOUNTER — Encounter: Payer: Self-pay | Admitting: Family Medicine

## 2015-01-07 ENCOUNTER — Ambulatory Visit (INDEPENDENT_AMBULATORY_CARE_PROVIDER_SITE_OTHER): Payer: BC Managed Care – PPO | Admitting: Family Medicine

## 2015-01-07 ENCOUNTER — Encounter: Payer: Self-pay | Admitting: Family Medicine

## 2015-01-07 VITALS — BP 98/62 | HR 75 | Temp 97.9°F | Ht 62.25 in | Wt 154.1 lb

## 2015-01-07 DIAGNOSIS — R1011 Right upper quadrant pain: Secondary | ICD-10-CM | POA: Diagnosis not present

## 2015-01-07 NOTE — Progress Notes (Signed)
Subjective:    Patient ID: Debra Edwards, female    DOB: 09-09-1976, 39 y.o.   MRN: [id number]  HPI Here with GI problems for a while   2 weeks ago - she came down with a "stomach flu"  (someone at work got it also) Nausea/dry heaves and decreased appetite, tired, no fever  ? If diarrhea  Just felt bad since then  Ate some roasted vegetables 10 days ago - got severe abdominal cramping all over abd  She took 2 levsin and 150 mg zantac -only helped a little  Then started prilosec a week ago  Eating pretty bland since then  occ gets a dull ache under R breast -it wraps around  Loose stool at times    Had reassuring colonosc 2012    Is on wt watchers- wt is down 12 lb   Patient Active Problem List   Diagnosis Date Noted  . Folliculitis 47/06/6282  . 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 Routine gynecological examination 11/07/2012  . Routine general medical examination at a health care facility 10/27/2012  . Migraine headache 06/24/2012  . Stress reaction 03/16/2012  . GERD (gastroesophageal reflux disease) 02/19/2012  . Infectious disease exposure 05/04/2011  . IBS (irritable bowel syndrome) 04/14/2011  . OBSTRUCTIVE SLEEP APNEA 09/05/2010  . BRUXISM 07/24/2010  . RESTLESS LEG SYNDROME 07/24/2010  . OTHER DYSPNEA AND RESPIRATORY ABNORMALITIES 07/08/2010  . ALLERGIC RHINITIS 12/17/2009  . HAND PAIN 06/11/2009  . NEOPLASM, SKIN, UNCERTAIN BEHAVIOR 66/29/4765  . PTSD 10/20/2007  . FIBROCYSTIC BREAST DISEASE 10/20/2007  . DYSMENORRHEA 10/20/2007   Past Medical History  Diagnosis Date  . PTSD (post-traumatic stress disorder)     with depressive symptoms  . Constipation, chronic   . Acid reflux     occasional  . Migraines   . 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 Heart murmur     Negative echo in the past  . Urticaria     stress induced  . History of chicken pox   . Anemia in the past    is a blood  donor  . Allergic rhinitis, seasonal     vasomotor rhinitis  . Dysmenorrhea   . OSA (obstructive sleep apnea)     PSG 08/20/10 RDI 18.7  . IBS (irritable bowel syndrome)    Past Surgical History  Procedure Laterality Date  . Wisdom tooth extraction     History  Substance Use Topics  . Smoking status: Never Smoker   . Smokeless tobacco: Never Used  . Alcohol Use: Yes     Comment: Rare   Family History  Problem Relation Age of Onset  . Hypertension Father   . Allergies Father   . Other Mother     Digestive problems-w/u @ [facility]-? bact inf  . Diabetes      GM  . Prostate cancer      GF  . Anxiety disorder      diffusely in family  . Depression      diffusely in family  . Colon cancer Neg Hx    No Known Allergies Current Outpatient Prescriptions on File Prior to Visit  Medication Sig Dispense Refill  . acetaminophen (TYLENOL) 500 MG tablet Take 500 mg by mouth every 6 (six) hours as needed. For headache    . clonazePAM (KLONOPIN) 0.5 MG tablet Take 0.5 mg by mouth at bedtime.     . cyclobenzaprine (  FLEXERIL) 10 MG tablet Take 1 tablet (10 mg total) by mouth 3 (three) times daily as needed for muscle spasms (and headache). 30 tablet 3  . escitalopram (LEXAPRO) 20 MG tablet Take 20 mg by mouth daily.    . fluticasone (FLONASE) 50 MCG/ACT nasal spray USE 2 SPRAYS IN EACH NOSTRIL ONCE DAILY 16 g 5  . Ibuprofen 200 MG CAPS Take 400-600 mg by mouth as needed.    . Melatonin 3 MG TABS Take 3-6 mg by mouth at bedtime as needed. For sleep    . ORSYTHIA 0.1-20 MG-MCG tablet TAKE 1 TABLET BY MOUTH EVERY DAY 28 tablet 1  . valACYclovir (VALTREX) 1000 MG tablet Take 2 pills by mouth twice daily for one day at first sign of a cold sore 12 tablet 3  . hyoscyamine (LEVSIN, ANASPAZ) 0.125 MG tablet Take 1 tablet (0.125 mg total) by mouth every 6 (six) hours as needed for cramping. 30 tablet 2   No current facility-administered medications on file prior to visit.       Review of  Systems Review of Systems  Constitutional: Negative for fever, appetite change, fatigue and unexpected weight change.  Eyes: Negative for pain and visual disturbance.  Respiratory: Negative for cough and shortness of breath.   Cardiovascular: Negative for cp or palpitations    Gastrointestinal: Negative for blood in stool/ dark stool/vomiting   Genitourinary: Negative for urgency and frequency.  Skin: Negative for pallor or rash   Neurological: Negative for weakness, light-headedness, numbness and headaches.  Hematological: Negative for adenopathy. Does not bruise/bleed easily.  Psychiatric/Behavioral: Negative for dysphoric mood. The patient is not nervous/anxious.         Objective:   Physical Exam  Constitutional: She appears well-developed and well-nourished. No distress.  obese and well appearing   HENT:  Head: Normocephalic and atraumatic.  Mouth/Throat: Oropharynx is clear and moist.  Eyes: Conjunctivae and EOM are normal. Pupils are equal, round, and reactive to light. No scleral icterus.  Neck: Normal range of motion. Neck supple.  Cardiovascular: Normal rate, regular rhythm and normal heart sounds.   Pulmonary/Chest: Effort normal and breath sounds normal. No respiratory distress. She has no wheezes. She has no rales.  Abdominal: Soft. Bowel sounds are normal. She exhibits no distension and no mass. There is no hepatosplenomegaly. There is tenderness in the right upper quadrant. There is positive Murphy's sign. There is no rigidity, no rebound, no guarding, no CVA tenderness and no tenderness at McBurney's point.  Musculoskeletal: She exhibits no edema.  Lymphadenopathy:    She has no cervical adenopathy.  Neurological: She is alert.  Skin: Skin is warm and dry. No rash noted. No erythema. No pallor.  No jaundice  Psychiatric: She has a normal mood and affect.          Assessment & Plan:   Problem List Items Addressed This Visit      Other   Abdominal pain,  right upper quadrant - Primary    Need to r/o gallbladder source  Disc diet  Ref for abd [place]  If symptoms suddenly worsen-will go to ER after hours and update        Relevant Orders   US Abdomen Complete

## 2015-01-07 NOTE — Progress Notes (Signed)
Pre visit review using our clinic review tool, if applicable. No additional management support is needed unless otherwise documented below in the visit note. 

## 2015-01-07 NOTE — Patient Instructions (Signed)
I wonder if you may have gallstones  Stop at check out for referral for abdominal ultrasound  Eat low fat  Drink lots of water  If you suddenly get severe abdominal pain - please go to ER if after hours

## 2015-01-07 NOTE — Assessment & Plan Note (Signed)
Need to r/o gallbladder source  Disc diet  Ref for abd [place]  If symptoms suddenly worsen-will go to ER after hours and update

## 2015-01-10 ENCOUNTER — Encounter: Payer: Self-pay | Admitting: Family Medicine

## 2015-01-10 ENCOUNTER — Ambulatory Visit
Admit: 2015-01-10 | Disposition: A | Discharge status: Home / Self Care | Payer: Self-pay | Attending: Family Medicine | Admitting: Family Medicine

## 2015-01-15 ENCOUNTER — Telehealth: Payer: Self-pay | Admitting: Family Medicine

## 2015-01-15 DIAGNOSIS — Z0000 Encounter for general adult medical examination without abnormal findings: Secondary | ICD-10-CM

## 2015-01-15 NOTE — Telephone Encounter (Signed)
-----   Message from Ellamae Sia sent at 01/11/2015 11:08 AM EDT ----- Regarding: Lab orders for Wednesday,4.13.16 Patient is scheduled for CPX labs, please order future labs, Thanks , Karna Christmas

## 2015-01-16 ENCOUNTER — Other Ambulatory Visit: Payer: BC Managed Care – PPO

## 2015-01-17 ENCOUNTER — Other Ambulatory Visit (INDEPENDENT_AMBULATORY_CARE_PROVIDER_SITE_OTHER): Payer: BC Managed Care – PPO

## 2015-01-17 DIAGNOSIS — Z0000 Encounter for general adult medical examination without abnormal findings: Secondary | ICD-10-CM

## 2015-01-17 LAB — CBC WITH DIFFERENTIAL/PLATELET
Basophils Absolute: 0 10*3/uL (ref 0.0–0.1)
Basophils Relative: 0.4 % (ref 0.0–3.0)
Eosinophils Absolute: 0.3 10*3/uL (ref 0.0–0.7)
Eosinophils Relative: 4.3 % (ref 0.0–5.0)
HEMATOCRIT: 38.7 % (ref 36.0–46.0)
Hemoglobin: 13.3 g/dL (ref 12.0–15.0)
Lymphocytes Relative: 31.1 % (ref 12.0–46.0)
Lymphs Abs: 1.8 10*3/uL (ref 0.7–4.0)
MCHC: 34.3 g/dL (ref 30.0–36.0)
MCV: 87.8 fl (ref 78.0–100.0)
MONOS PCT: 5.4 % (ref 3.0–12.0)
Monocytes Absolute: 0.3 10*3/uL (ref 0.1–1.0)
NEUTROS PCT: 58.8 % (ref 43.0–77.0)
Neutro Abs: 3.4 10*3/uL (ref 1.4–7.7)
PLATELETS: 185 10*3/uL (ref 150.0–400.0)
RBC: 4.4 Mil/uL (ref 3.87–5.11)
RDW: 12.9 % (ref 11.5–15.5)
WBC: 5.8 10*3/uL (ref 4.0–10.5)

## 2015-01-17 LAB — COMPREHENSIVE METABOLIC PANEL
ALBUMIN: 3.8 g/dL (ref 3.5–5.2)
ALT: 15 U/L (ref 0–35)
AST: 16 U/L (ref 0–37)
Alkaline Phosphatase: 41 U/L (ref 39–117)
BUN: 11 mg/dL (ref 6–23)
CALCIUM: 8.9 mg/dL (ref 8.4–10.5)
CHLORIDE: 106 meq/L (ref 96–112)
CO2: 28 mEq/L (ref 19–32)
Creatinine, Ser: 0.87 mg/dL (ref 0.40–1.20)
GFR: 76.89 mL/min (ref >60.00)
Glucose, Bld: 87 mg/dL (ref 70–99)
POTASSIUM: 4 meq/L (ref 3.5–5.1)
SODIUM: 139 meq/L (ref 135–145)
Total Bilirubin: 0.4 mg/dL (ref 0.2–1.2)
Total Protein: 6.5 g/dL (ref 6.0–8.3)

## 2015-01-17 LAB — LIPID PANEL
Cholesterol: 147 mg/dL (ref 0–200)
HDL: 54.1 mg/dL (ref >39.00)
LDL Cholesterol: 73 mg/dL (ref 0–99)
NonHDL: 92.9
TRIGLYCERIDES: 100 mg/dL (ref 0.0–149.0)
Total CHOL/HDL Ratio: 3
VLDL: 20 mg/dL (ref 0.0–40.0)

## 2015-01-17 LAB — TSH: TSH: 2.02 u[IU]/mL (ref 0.35–4.50)

## 2015-01-21 ENCOUNTER — Other Ambulatory Visit: Payer: Self-pay | Admitting: Family Medicine

## 2015-01-23 ENCOUNTER — Encounter: Payer: BC Managed Care – PPO | Admitting: Family Medicine

## 2015-01-25 ENCOUNTER — Encounter: Payer: Self-pay | Admitting: Family Medicine

## 2015-01-25 ENCOUNTER — Ambulatory Visit (INDEPENDENT_AMBULATORY_CARE_PROVIDER_SITE_OTHER): Payer: BC Managed Care – PPO | Admitting: Family Medicine

## 2015-01-25 VITALS — BP 106/68 | HR 68 | Temp 98.4°F | Ht 62.5 in | Wt 156.5 lb

## 2015-01-25 DIAGNOSIS — L600 Ingrowing nail: Secondary | ICD-10-CM | POA: Diagnosis not present

## 2015-01-25 DIAGNOSIS — Z0000 Encounter for general adult medical examination without abnormal findings: Secondary | ICD-10-CM

## 2015-01-25 MED ORDER — CEPHALEXIN 500 MG PO CAPS: 500.0000 mg | ORAL_CAPSULE | Freq: Three times a day (TID) | ORAL | Status: DC

## 2015-01-25 NOTE — Progress Notes (Signed)
Subjective:    Patient ID: Debra Edwards, female    DOB: Apr 12, 1976, 39 y.o.   MRN: [id number]  HPI Here for health maintenance exam and to review chronic medical problems    Doing well overall  Her stomach did get better -relieved about that  Is back to constipation - which is normal for her  Feeling better   Wt is up 2 lb with bmi of 28  BP Readings from Last 3 Encounters:  01/25/15 106/68  01/07/15 98/62  05/07/14 122/66    HIV screen- not high risk- declines it / not sexually active   In counseling for PTSD - is difficult for her  Is starting to do better  More intense level of tx now - thinks it is helping  Flu shot- had one in the fall   Pap 2/14 nl  Not sexually active  No hx of abn pap No hx of std Never had HPV  No gyn symptoms  Menses- improved with OC (much reduced cramping and lighter flow)  On OC -orsythia , has noticed some acne around mouth - this is new/ ? From the pill  Self breast exam- no lumps but has not checked regularly   Td 2/12   Results for orders placed or performed in visit on 01/17/15  CBC with Differential/Platelet  Result Value Ref Range   WBC 5.8 4.0 - 10.5 K/uL   RBC 4.40 3.87 - 5.11 Mil/uL   Hemoglobin 13.3 12.0 - 15.0 g/dL   HCT 38.7 36.0 - 46.0 %   MCV 87.8 78.0 - 100.0 fl   MCHC 34.3 30.0 - 36.0 g/dL   RDW 12.9 11.5 - 15.5 %   Platelets 185.0 150.0 - 400.0 K/uL   Neutrophils Relative % 58.8 43.0 - 77.0 %   Lymphocytes Relative 31.1 12.0 - 46.0 %   Monocytes Relative 5.4 3.0 - 12.0 %   Eosinophils Relative 4.3 0.0 - 5.0 %   Basophils Relative 0.4 0.0 - 3.0 %   Neutro Abs 3.4 1.4 - 7.7 K/uL   Lymphs Abs 1.8 0.7 - 4.0 K/uL   Monocytes Absolute 0.3 0.1 - 1.0 K/uL   Eosinophils Absolute 0.3 0.0 - 0.7 K/uL   Basophils Absolute 0.0 0.0 - 0.1 K/uL  Comprehensive metabolic panel  Result Value Ref Range   Sodium 139 135 - 145 mEq/L   Potassium 4.0 3.5 - 5.1 mEq/L   Chloride 106 96 - 112 mEq/L   CO2 28 19 - 32 mEq/L     Glucose, Bld 87 70 - 99 mg/dL   BUN 11 6 - 23 mg/dL   Creatinine, Ser 0.87 0.40 - 1.20 mg/dL   Total Bilirubin 0.4 0.2 - 1.2 mg/dL   Alkaline Phosphatase 41 39 - 117 U/L   AST 16 0 - 37 U/L   ALT 15 0 - 35 U/L   Total Protein 6.5 6.0 - 8.3 g/dL   Albumin 3.8 3.5 - 5.2 g/dL   Calcium 8.9 8.4 - 10.5 mg/dL   GFR 76.89 >60.00 mL/min  Lipid panel  Result Value Ref Range   Cholesterol 147 0 - 200 mg/dL   Triglycerides 100.0 0.0 - 149.0 mg/dL   HDL 54.10 >39.00 mg/dL   VLDL 20.0 0.0 - 40.0 mg/dL   LDL Cholesterol 73 0 - 99 mg/dL   Total CHOL/HDL Ratio 3    NonHDL 92.90   TSH  Result Value Ref Range   TSH 2.02 0.35 - 4.50 uIU/mL  Labs are ok  Great cholesterol profile !   Working on wt with wt watchers  Goal wt for them is 136  She does not feel comfortable with that and cannot mt it that low   Patient Active Problem List   Diagnosis Date Noted  . Abdominal pain, right upper quadrant 01/07/2015  . Folliculitis 57/32/2025  . 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 Routine gynecological examination 11/07/2012  . Routine general medical examination at a health care facility 10/27/2012  . Migraine headache 06/24/2012  . Stress reaction 03/16/2012  . GERD (gastroesophageal reflux disease) 02/19/2012  . Infectious disease exposure 05/04/2011  . IBS (irritable bowel syndrome) 04/14/2011  . OBSTRUCTIVE SLEEP APNEA 09/05/2010  . BRUXISM 07/24/2010  . RESTLESS LEG SYNDROME 07/24/2010  . OTHER DYSPNEA AND RESPIRATORY ABNORMALITIES 07/08/2010  . ALLERGIC RHINITIS 12/17/2009  . HAND PAIN 06/11/2009  . NEOPLASM, SKIN, UNCERTAIN BEHAVIOR 42/70/6237  . PTSD 10/20/2007  . FIBROCYSTIC BREAST DISEASE 10/20/2007  . DYSMENORRHEA 10/20/2007   Past Medical History  Diagnosis Date  . PTSD (post-traumatic stress disorder)     with depressive symptoms  . Constipation, chronic   . Acid reflux     occasional  . Migraines   . 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 Heart murmur     Negative echo in the past  . Urticaria     stress induced  . History of chicken pox   . Anemia in the past    is a blood donor  . Allergic rhinitis, seasonal     vasomotor rhinitis  . Dysmenorrhea   . OSA (obstructive sleep apnea)     PSG 08/20/10 RDI 18.7  . IBS (irritable bowel syndrome)    Past Surgical History  Procedure Laterality Date  . Wisdom tooth extraction     History  Substance Use Topics  . Smoking status: Never Smoker   . Smokeless tobacco: Never Used  . Alcohol Use: 0.0 oz/week    0 Standard drinks or equivalent per week     Comment: Rare   Family History  Problem Relation Age of Onset  . Hypertension Father   . Allergies Father   . Other Mother     Digestive problems-w/u @ [facility]-? bact inf  . Diabetes      GM  . Prostate cancer      GF  . Anxiety disorder      diffusely in family  . Depression      diffusely in family  . Colon cancer Neg Hx    No Known Allergies Current Outpatient Prescriptions on File Prior to Visit  Medication Sig Dispense Refill  . acetaminophen (TYLENOL) 500 MG tablet Take 500 mg by mouth every 6 (six) hours as needed. For headache    . clonazePAM (KLONOPIN) 0.5 MG tablet Take 0.5 mg by mouth at bedtime.     . cyclobenzaprine (FLEXERIL) 10 MG tablet Take 1 tablet (10 mg total) by mouth 3 (three) times daily as needed for muscle spasms (and headache). 30 tablet 3  . escitalopram (LEXAPRO) 20 MG tablet Take 20 mg by mouth daily.    . fluticasone (FLONASE) 50 MCG/ACT nasal spray USE 2 SPRAYS IN EACH NOSTRIL ONCE DAILY 16 g 5  . Ibuprofen 200 MG CAPS Take 400-600 mg by mouth as needed.    . Melatonin 3 MG TABS Take 3-6 mg by mouth at bedtime as needed. For  sleep    . omeprazole (PRILOSEC) 20 MG capsule Take 20 mg by mouth daily.    . ORSYTHIA 0.1-20 MG-MCG tablet TAKE 1 TABLET BY MOUTH EVERY DAY 28 tablet 0  . ranitidine (ZANTAC) 150 MG tablet Take 150 mg by mouth 2 (two) times  daily.    . valACYclovir (VALTREX) 1000 MG tablet Take 2 pills by mouth twice daily for one day at first sign of a cold sore 12 tablet 3  . hyoscyamine (LEVSIN, ANASPAZ) 0.125 MG tablet Take 1 tablet (0.125 mg total) by mouth every 6 (six) hours as needed for cramping. 30 tablet 2   No current facility-administered medications on file prior to visit.      Review of Systems Review of Systems  Constitutional: Negative for fever, appetite change, fatigue and unexpected weight change.  Eyes: Negative for pain and visual disturbance.  Respiratory: Negative for cough and shortness of breath.   Cardiovascular: Negative for cp or palpitations    Gastrointestinal: Negative for nausea, diarrhea and constipation.  Genitourinary: Negative for urgency and frequency.  Skin: Negative for pallor or rash  pos for redness in L great toe lateral to toenail that is sore  Neurological: Negative for weakness, light-headedness, numbness and headaches.  Hematological: Negative for adenopathy. Does not bruise/bleed easily.  Psychiatric/Behavioral: Negative for dysphoric mood. The patient is not nervous/anxious.         Objective:   Physical Exam  Constitutional: She appears well-developed and well-nourished. No distress.  overwt  and well appearing   HENT:  Head: Normocephalic and atraumatic.  Right Ear: External ear normal.  Left Ear: External ear normal.  Mouth/Throat: Oropharynx is clear and moist.  Eyes: Conjunctivae and EOM are normal. Pupils are equal, round, and reactive to light. No scleral icterus.  Neck: Normal range of motion. Neck supple. No JVD present. Carotid bruit is not present. No thyromegaly present.  Cardiovascular: Normal rate, regular rhythm, normal heart sounds and intact distal pulses.  Exam reveals no gallop.   Pulmonary/Chest: Effort normal and breath sounds normal. No respiratory distress. She has no wheezes. She exhibits no tenderness.  Abdominal: Soft. Bowel sounds are normal.  She exhibits no distension, no abdominal bruit and no mass. There is no tenderness.  Genitourinary: No breast swelling, tenderness, discharge or bleeding.  Breast exam: No mass, nodules, thickening, tenderness, bulging, retraction, inflamation, nipple discharge or skin changes noted.  No axillary or clavicular LA.      Musculoskeletal: Normal range of motion. She exhibits no edema or tenderness.  Lymphadenopathy:    She has no cervical adenopathy.  Neurological: She is alert. She has normal reflexes. No cranial nerve deficit. She exhibits normal muscle tone. Coordination normal.  Skin: Skin is warm and dry. No rash noted. No erythema. No pallor.  Some stable brown nevi  Skin lateral to L great toenail is red and tender with swelling but no drainage or fluctuance That nail is mildly ingrown     Psychiatric: She has a normal mood and affect.          Assessment & Plan:   Problem List Items Addressed This Visit      Musculoskeletal and Integument   Ingrown left big toenail    Lateral skin erythema and swelling tx with keflex abx oint otc and warm soap water soaks Update if not starting to improve in a week or if worsening   May need toenail removed if this re occurs  Disc letting toe nail grow out and  then cutting straight across         Other   Routine general medical examination at a health care facility - Primary    Reviewed health habits including diet and exercise and skin cancer prevention Reviewed appropriate screening tests for age  Also reviewed health mt list, fam hx and immunization status , as well as social and family history    Labs reviewed See HPI Working on weight loss and health habits - disc beginning goal for wt loss to get to bmi of 27 Letter done for wt watchers

## 2015-01-25 NOTE — Patient Instructions (Addendum)
For infected ingrown toenail- keep pressure off of it and soak in soapy warm water whenever you can  Take the keflex for a week - if not improvement let me know  You can use over the counter antibiotic ointment as often as you want as well  Let nail grow straight out and cut it straight across  For BMI of less than 27- I recommend a weight of 150 lb or less to aim for  Take care of yourself

## 2015-01-25 NOTE — Progress Notes (Signed)
Pre visit review using our clinic review tool, if applicable. No additional management support is needed unless otherwise documented below in the visit note. 

## 2015-01-27 NOTE — Assessment & Plan Note (Signed)
Reviewed health habits including diet and exercise and skin cancer prevention Reviewed appropriate screening tests for age  Also reviewed health mt list, fam hx and immunization status , as well as social and family history    Labs reviewed See HPI Working on weight loss and health habits - disc beginning goal for wt loss to get to bmi of 27 Letter done for wt watchers

## 2015-01-27 NOTE — Assessment & Plan Note (Signed)
Lateral skin erythema and swelling tx with keflex abx oint otc and warm soap water soaks Update if not starting to improve in a week or if worsening   May need toenail removed if this re occurs  Disc letting toe nail grow out and then cutting straight across

## 2015-02-23 ENCOUNTER — Other Ambulatory Visit: Payer: Self-pay | Admitting: Family Medicine

## 2015-05-05 ENCOUNTER — Other Ambulatory Visit: Payer: Self-pay | Admitting: Family Medicine

## 2015-05-06 NOTE — Telephone Encounter (Signed)
done

## 2015-05-06 NOTE — Telephone Encounter (Signed)
Please refill times 3 

## 2015-05-06 NOTE — Telephone Encounter (Signed)
Electronic refill request, pt has CPE on 01/25/15, last refilled on 12/06/13 #30 with 3 additional refills, please advise

## 2015-05-16 ENCOUNTER — Other Ambulatory Visit: Payer: Self-pay | Admitting: Family Medicine

## 2015-08-01 ENCOUNTER — Other Ambulatory Visit: Payer: Self-pay | Admitting: Family Medicine

## 2015-09-23 ENCOUNTER — Other Ambulatory Visit: Payer: Self-pay | Admitting: Family Medicine

## 2016-01-13 ENCOUNTER — Other Ambulatory Visit: Payer: Self-pay | Admitting: *Deleted

## 2016-01-13 ENCOUNTER — Other Ambulatory Visit: Payer: Self-pay | Admitting: Family Medicine

## 2016-01-13 NOTE — Telephone Encounter (Signed)
Please schedule PE and refill until then  

## 2016-01-13 NOTE — Telephone Encounter (Signed)
Fax refill request, no recent/future appt., please advise  

## 2016-01-14 MED ORDER — LEVONORGESTREL-ETHINYL ESTRAD 0.1-20 MG-MCG PO TABS: 1.0000 | ORAL_TABLET | Freq: Every day | ORAL | Status: DC

## 2016-01-14 NOTE — Telephone Encounter (Signed)
Couldn't get in touch with pt so med refilled for one month and pharmacy notified she needs to schedule CPE before future refills are given

## 2016-03-01 ENCOUNTER — Other Ambulatory Visit: Payer: Self-pay | Admitting: Family Medicine

## 2016-03-16 ENCOUNTER — Other Ambulatory Visit: Payer: Self-pay | Admitting: Family Medicine

## 2016-03-16 NOTE — Telephone Encounter (Signed)
Pt left v/m requesting status of Orsythia refill; left v/m per DPR that refills were already done and pt to ck with pharmacy.

## 2016-06-08 ENCOUNTER — Other Ambulatory Visit: Payer: Self-pay | Admitting: Family Medicine

## 2016-06-28 ENCOUNTER — Telehealth: Payer: Self-pay | Admitting: Family Medicine

## 2016-06-28 DIAGNOSIS — Z0000 Encounter for general adult medical examination without abnormal findings: Secondary | ICD-10-CM

## 2016-06-28 NOTE — Telephone Encounter (Signed)
-----   Message from Ellamae Sia sent at 06/25/2016  4:47 PM EDT ----- Regarding: Lab orders for Friday, 9.29.17 Patient is scheduled for CPX labs, please order future labs, Thanks , Karna Christmas

## 2016-07-03 ENCOUNTER — Other Ambulatory Visit (INDEPENDENT_AMBULATORY_CARE_PROVIDER_SITE_OTHER): Payer: BC Managed Care – PPO

## 2016-07-03 DIAGNOSIS — Z0000 Encounter for general adult medical examination without abnormal findings: Secondary | ICD-10-CM | POA: Diagnosis not present

## 2016-07-03 LAB — COMPREHENSIVE METABOLIC PANEL
ALK PHOS: 45 U/L (ref 39–117)
ALT: 21 U/L (ref 0–35)
AST: 21 U/L (ref 0–37)
Albumin: 3.6 g/dL (ref 3.5–5.2)
BUN: 10 mg/dL (ref 6–23)
CALCIUM: 8.5 mg/dL (ref 8.4–10.5)
CHLORIDE: 104 meq/L (ref 96–112)
CO2: 29 mEq/L (ref 19–32)
Creatinine, Ser: 0.82 mg/dL (ref 0.40–1.20)
GFR: 81.72 mL/min (ref >60.00)
GLUCOSE: 80 mg/dL (ref 70–99)
POTASSIUM: 3.9 meq/L (ref 3.5–5.1)
Sodium: 139 mEq/L (ref 135–145)
Total Bilirubin: 0.4 mg/dL (ref 0.2–1.2)
Total Protein: 6.5 g/dL (ref 6.0–8.3)

## 2016-07-03 LAB — CBC WITH DIFFERENTIAL/PLATELET
BASOS PCT: 0.4 % (ref 0.0–3.0)
Basophils Absolute: 0 10*3/uL (ref 0.0–0.1)
Eosinophils Absolute: 0.1 10*3/uL (ref 0.0–0.7)
Eosinophils Relative: 1.4 % (ref 0.0–5.0)
HEMATOCRIT: 37.2 % (ref 36.0–46.0)
Hemoglobin: 12.9 g/dL (ref 12.0–15.0)
LYMPHS ABS: 1.6 10*3/uL (ref 0.7–4.0)
LYMPHS PCT: 32.6 % (ref 12.0–46.0)
MCHC: 34.6 g/dL (ref 30.0–36.0)
MCV: 86.4 fl (ref 78.0–100.0)
MONOS PCT: 7.7 % (ref 3.0–12.0)
Monocytes Absolute: 0.4 10*3/uL (ref 0.1–1.0)
NEUTROS ABS: 2.9 10*3/uL (ref 1.4–7.7)
Neutrophils Relative %: 57.9 % (ref 43.0–77.0)
PLATELETS: 225 10*3/uL (ref 150.0–400.0)
RBC: 4.31 Mil/uL (ref 3.87–5.11)
RDW: 12.5 % (ref 11.5–15.5)
WBC: 5 10*3/uL (ref 4.0–10.5)

## 2016-07-03 LAB — TSH: TSH: 3.24 u[IU]/mL (ref 0.35–4.50)

## 2016-07-03 LAB — LIPID PANEL
CHOLESTEROL: 151 mg/dL (ref 0–200)
HDL: 53.3 mg/dL (ref >39.00)
LDL CALC: 69 mg/dL (ref 0–99)
NonHDL: 97.36
TRIGLYCERIDES: 144 mg/dL (ref 0.0–149.0)
Total CHOL/HDL Ratio: 3
VLDL: 28.8 mg/dL (ref 0.0–40.0)

## 2016-07-08 ENCOUNTER — Encounter: Payer: Self-pay | Admitting: Family Medicine

## 2016-07-08 ENCOUNTER — Ambulatory Visit (INDEPENDENT_AMBULATORY_CARE_PROVIDER_SITE_OTHER): Payer: BC Managed Care – PPO | Admitting: Family Medicine

## 2016-07-08 VITALS — BP 106/68 | HR 64 | Temp 98.4°F | Ht 62.25 in | Wt 182.4 lb

## 2016-07-08 DIAGNOSIS — N946 Dysmenorrhea, unspecified: Secondary | ICD-10-CM | POA: Diagnosis not present

## 2016-07-08 DIAGNOSIS — Z1231 Encounter for screening mammogram for malignant neoplasm of breast: Secondary | ICD-10-CM | POA: Diagnosis not present

## 2016-07-08 DIAGNOSIS — Z0000 Encounter for general adult medical examination without abnormal findings: Secondary | ICD-10-CM

## 2016-07-08 DIAGNOSIS — Z23 Encounter for immunization: Secondary | ICD-10-CM

## 2016-07-08 DIAGNOSIS — F430 Acute stress reaction: Secondary | ICD-10-CM | POA: Diagnosis not present

## 2016-07-08 MED ORDER — FLUTICASONE PROPIONATE 50 MCG/ACT NA SUSP: 2.0000 | Freq: Every day | NASAL | 11 refills | Status: DC

## 2016-07-08 MED ORDER — LEVONORGESTREL-ETHINYL ESTRAD 0.1-20 MG-MCG PO TABS: 1.0000 | ORAL_TABLET | Freq: Every day | ORAL | 3 refills | Status: DC

## 2016-07-08 MED ORDER — HYOSCYAMINE SULFATE 0.125 MG PO TABS: ORAL_TABLET | ORAL | 5 refills | Status: DC

## 2016-07-08 MED ORDER — VALACYCLOVIR HCL 1 G PO TABS: ORAL_TABLET | ORAL | 3 refills | Status: DC

## 2016-07-08 NOTE — Progress Notes (Signed)
Subjective:    Patient ID: Debra Edwards, female    DOB: 1976-05-13, 40 y.o.   MRN: [id number]  HPI Here for health maintenance exam and to review chronic medical problems    Doing well overall  Went to a meditation retreat this summer 2 weeks  Continues to meditate  Also the beach for a week   She is taking care of herself  Not exercising like she should -hard to fit it in  She does like yoga-it is challenging    Wt Readings from Last 3 Encounters:  07/08/16 182 lb 6.4 oz (82.7 kg)  01/25/15 156 lb 8 oz (71 kg)  01/07/15 154 lb 1.9 oz (69.9 kg)  put on weight - emotional eating due to a bad situation at work  Things are getting better however bmi is 33.0  Still going to [facility] therapy every other week -that is working well    Pap 2/14-normal -has menses today / will come back another time (not sexually active either)  Gets really tired during menses-feels rotten  Not too heavy or long  She gets more migraines the Friday before (accu pressure helps that) On OC- orsythia - does help her periods  She it interested in taking it continuously   Flu shot -has not had one -- will do today Last week she has uri symptoms and fever  Still some pnd   Tetanus shot 2/12  Results for orders placed or performed in visit on 07/03/16  CBC with Differential/Platelet  Result Value Ref Range   WBC 5.0 4.0 - 10.5 K/uL   RBC 4.31 3.87 - 5.11 Mil/uL   Hemoglobin 12.9 12.0 - 15.0 g/dL   HCT 37.2 36.0 - 46.0 %   MCV 86.4 78.0 - 100.0 fl   MCHC 34.6 30.0 - 36.0 g/dL   RDW 12.5 11.5 - 15.5 %   Platelets 225.0 150.0 - 400.0 K/uL   Neutrophils Relative % 57.9 43.0 - 77.0 %   Lymphocytes Relative 32.6 12.0 - 46.0 %   Monocytes Relative 7.7 3.0 - 12.0 %   Eosinophils Relative 1.4 0.0 - 5.0 %   Basophils Relative 0.4 0.0 - 3.0 %   Neutro Abs 2.9 1.4 - 7.7 K/uL   Lymphs Abs 1.6 0.7 - 4.0 K/uL   Monocytes Absolute 0.4 0.1 - 1.0 K/uL   Eosinophils Absolute 0.1 0.0 - 0.7 K/uL   Basophils Absolute 0.0 0.0 - 0.1 K/uL  Comprehensive metabolic panel  Result Value Ref Range   Sodium 139 135 - 145 mEq/L   Potassium 3.9 3.5 - 5.1 mEq/L   Chloride 104 96 - 112 mEq/L   CO2 29 19 - 32 mEq/L   Glucose, Bld 80 70 - 99 mg/dL   BUN 10 6 - 23 mg/dL   Creatinine, Ser 0.82 0.40 - 1.20 mg/dL   Total Bilirubin 0.4 0.2 - 1.2 mg/dL   Alkaline Phosphatase 45 39 - 117 U/L   AST 21 0 - 37 U/L   ALT 21 0 - 35 U/L   Total Protein 6.5 6.0 - 8.3 g/dL   Albumin 3.6 3.5 - 5.2 g/dL   Calcium 8.5 8.4 - 10.5 mg/dL   GFR 81.72 >60.00 mL/min  Lipid panel  Result Value Ref Range   Cholesterol 151 0 - 200 mg/dL   Triglycerides 144.0 0.0 - 149.0 mg/dL   HDL 53.30 >39.00 mg/dL   VLDL 28.8 0.0 - 40.0 mg/dL   LDL Cholesterol 69 0 - 99 mg/dL  Total CHOL/HDL Ratio 3    NonHDL 97.36   TSH  Result Value Ref Range   TSH 3.24 0.35 - 4.50 uIU/mL    Labs are good!  Needs to schedule her first screening mammogram   Patient Active Problem List   Diagnosis Date Noted  . 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 Routine gynecological examination 11/07/2012  . Routine general medical examination at a health care facility 10/27/2012  . Migraine headache 06/24/2012  . Stress reaction 03/16/2012  . GERD (gastroesophageal reflux disease) 02/19/2012  . IBS (irritable bowel syndrome) 04/14/2011  . OBSTRUCTIVE SLEEP APNEA 09/05/2010  . BRUXISM 07/24/2010  . RESTLESS LEG SYNDROME 07/24/2010  . OTHER DYSPNEA AND RESPIRATORY ABNORMALITIES 07/08/2010  . ALLERGIC RHINITIS 12/17/2009  . NEOPLASM, SKIN, UNCERTAIN BEHAVIOR 99991111  . PTSD 10/20/2007  . FIBROCYSTIC BREAST DISEASE 10/20/2007  . DYSMENORRHEA 10/20/2007   Past Medical History:  Diagnosis Date  . Acid reflux    occasional  . Allergic rhinitis, seasonal    vasomotor rhinitis  . Anemia in the past   is a blood donor  . Constipation, chronic   . Dysmenorrhea   . Heart murmur    Negative echo in the past  . History of chicken pox   .  IBS (irritable bowel syndrome)   . Migraines   . OSA (obstructive sleep apnea)    PSG 08/20/10 RDI 18.7  . 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 PTSD (post-traumatic stress disorder)    with depressive symptoms  . Urticaria    stress induced   Past Surgical History:  Procedure Laterality Date  . WISDOM TOOTH EXTRACTION     Social History  Substance Use Topics  . Smoking status: Never Smoker  . Smokeless tobacco: Never Used  . Alcohol use 0.0 oz/week     Comment: Rare   Family History  Problem Relation Age of Onset  . Hypertension Father   . Allergies Father   . Other Mother     Digestive problems-w/u @ [place] [facility]-? bact inf  . Diabetes      GM  . Prostate cancer      GF  . Anxiety disorder      diffusely in family  . Depression      diffusely in family  . Colon cancer Neg Hx    No Known Allergies Current Outpatient Prescriptions on File Prior to Visit  Medication Sig Dispense Refill  . acetaminophen (TYLENOL) 500 MG tablet Take 500 mg by mouth every 6 (six) hours as needed. For headache    . clonazePAM (KLONOPIN) 0.5 MG tablet Take 0.5 mg by mouth at bedtime.     . cyclobenzaprine (FLEXERIL) 10 MG tablet TAKE 1 TABLET BY MOUTH 3 TIMES A DAY AS NEEDED 30 tablet 2  . Ibuprofen 200 MG CAPS Take 400-600 mg by mouth as needed.    . Melatonin 3 MG TABS Take 3-6 mg by mouth at bedtime as needed. For sleep    . omeprazole (PRILOSEC) 20 MG capsule Take 20 mg by mouth daily.    . ranitidine (ZANTAC) 150 MG tablet Take 150 mg by mouth 2 (two) times daily.     No current facility-administered medications on file prior to visit.      Review of Systems    Review of Systems  Constitutional: Negative for fever, appetite change, fatigue and unexpected weight change.  Eyes: Negative for pain and visual disturbance.  Respiratory: Negative for cough and shortness of breath.   Cardiovascular: Negative for cp or palpitations     Gastrointestinal: Negative for nausea, diarrhea and constipation.  Genitourinary: Negative for urgency and frequency. pos for painful, heavy menses with headaches nad mood changes  Skin: Negative for pallor or rash   Neurological: Negative for weakness, light-headedness, numbness and headaches.  Hematological: Negative for adenopathy. Does not bruise/bleed easily.  Psychiatric/Behavioral: Negative for dysphoric mood. The patient is at times nervous/anxious.  pos for hx of PTSD    Objective:   Physical Exam  Constitutional: She appears well-developed and well-nourished. No distress.  obese and well appearing   HENT:  Head: Normocephalic and atraumatic.  Right Ear: External ear normal.  Left Ear: External ear normal.  Mouth/Throat: Oropharynx is clear and moist.  Eyes: Conjunctivae and EOM are normal. Pupils are equal, round, and reactive to light. No scleral icterus.  Neck: Normal range of motion. Neck supple. No JVD present. Carotid bruit is not present. No thyromegaly present.  Cardiovascular: Normal rate, regular rhythm, normal heart sounds and intact distal pulses.  Exam reveals no gallop.   Pulmonary/Chest: Effort normal and breath sounds normal. No respiratory distress. She has no wheezes. She exhibits no tenderness.  Abdominal: Soft. Bowel sounds are normal. She exhibits no distension, no abdominal bruit and no mass. There is no tenderness.  Genitourinary: No breast swelling, tenderness, discharge or bleeding.  Genitourinary Comments: Breast exam: No mass, nodules, thickening, tenderness, bulging, retraction, inflamation, nipple discharge or skin changes noted.  No axillary or clavicular LA.      Musculoskeletal: Normal range of motion. She exhibits no edema or tenderness.  Lymphadenopathy:    She has no cervical adenopathy.  Neurological: She is alert. She has normal reflexes. No cranial nerve deficit. She exhibits normal muscle tone. Coordination normal.  Skin: Skin is warm  and dry. No rash noted. No erythema. No pallor.  Lentigines and some brown nevi  Psychiatric: She has a normal mood and affect.          Assessment & Plan:   Problem List Items Addressed This Visit      Genitourinary   DYSMENORRHEA    Will attempt to stop/minimize menses with uninterrupted OC  Will change dosing of of Orsythia to daily w/o a break  inst pt to update me if she develops breakthrough bleeding or side effects  Doing well on this pill  Has significant menstrual symptoms incl pain and headaches         Other   RESOLVED: Encounter for screening mammogram for breast cancer   Relevant Orders   MM DIGITAL SCREENING BILATERAL   Routine general medical examination at a health care facility - Primary    Reviewed health habits including diet and exercise and skin cancer prevention Reviewed appropriate screening tests for age  Also reviewed health mt list, fam hx and immunization status , as well as social and family history   See HPI Labs rev Referred for first screening mammogram Will return for pap since she has menses today Is not sexually active Try to set up an exercise program  Keep meditating  Watch diet for carbs/fat and also work on mindful eating (stopping emotional eating) Try taking the oral contraceptive daily without a break-if you have breakthrough bleeding let me know  We will call you to set up your mammogram  Labs look good       Stress reaction    Pt continues meditation and also counseling  Reviewed stressors/ coping techniques/symptoms/ support sources/ tx options and side effects in detail today        Other Visit Diagnoses    Need for influenza vaccination       Relevant Orders   Flu Vaccine QUAD 36+ mos IM (Completed)

## 2016-07-08 NOTE — Progress Notes (Signed)
Pre visit review using our clinic review tool, if applicable. No additional management support is needed unless otherwise documented below in the visit note. 

## 2016-07-08 NOTE — Patient Instructions (Signed)
Try to set up an exercise program  Keep meditating  Watch diet for carbs/fat and also work on mindful eating (stopping emotional eating) Try taking the oral contraceptive daily without a break-if you have breakthrough bleeding let me know  We will call you to set up your mammogram  Labs look good

## 2016-07-09 NOTE — Assessment & Plan Note (Addendum)
Reviewed health habits including diet and exercise and skin cancer prevention Reviewed appropriate screening tests for age  Also reviewed health mt list, fam hx and immunization status , as well as social and family history   See HPI Labs rev Referred for first screening mammogram Will return for pap since she has menses today Is not sexually active Try to set up an exercise program  Keep meditating  Watch diet for carbs/fat and also work on mindful eating (stopping emotional eating) Try taking the oral contraceptive daily without a break-if you have breakthrough bleeding let me know  We will call you to set up your mammogram  Labs look good

## 2016-07-09 NOTE — Assessment & Plan Note (Signed)
Will attempt to stop/minimize menses with uninterrupted OC  Will change dosing of of Orsythia to daily w/o a break  inst pt to update me if she develops breakthrough bleeding or side effects  Doing well on this pill  Has significant menstrual symptoms incl pain and headaches

## 2016-07-09 NOTE — Assessment & Plan Note (Signed)
Pt continues meditation and also counseling Reviewed stressors/ coping techniques/symptoms/ support sources/ tx options and side effects in detail today

## 2016-07-12 ENCOUNTER — Other Ambulatory Visit: Payer: Self-pay | Admitting: Family Medicine

## 2016-07-13 NOTE — Telephone Encounter (Signed)
Last filled on 05/06/15, last OV 07/08/16. Ok to refill?

## 2016-07-13 NOTE — Telephone Encounter (Signed)
Please refill times one  

## 2016-08-11 ENCOUNTER — Ambulatory Visit (INDEPENDENT_AMBULATORY_CARE_PROVIDER_SITE_OTHER): Payer: BC Managed Care – PPO | Admitting: Family Medicine

## 2016-08-11 ENCOUNTER — Encounter: Payer: Self-pay | Admitting: Family Medicine

## 2016-08-11 VITALS — BP 104/62 | HR 75 | Temp 98.5°F | Ht 62.25 in | Wt 183.8 lb

## 2016-08-11 DIAGNOSIS — M62830 Muscle spasm of back: Secondary | ICD-10-CM | POA: Diagnosis not present

## 2016-08-11 DIAGNOSIS — Z1231 Encounter for screening mammogram for malignant neoplasm of breast: Secondary | ICD-10-CM | POA: Insufficient documentation

## 2016-08-11 MED ORDER — CYCLOBENZAPRINE HCL 10 MG PO TABS: 10.0000 mg | ORAL_TABLET | Freq: Three times a day (TID) | ORAL | 1 refills | Status: DC | PRN

## 2016-08-11 NOTE — Progress Notes (Signed)
Subjective:    Patient ID: Debra Edwards, female    DOB: November 13, 1975, 40 y.o.   MRN: [id number]  HPI Here with back pain   Tuesday night - she lifted a very heavy garbage bag and pushed herself too far (and it broke open and she had to clean it up)  The next day she developed pain in the back of R shoulder blade- radiating to her arm  Some tingling in her R hand  Then it started moving down the back of her spine Pain got pretty bad She took ibuprofen and it helped a bit , also ice The next day she developed spasms up and down entire spine (barely made it through the work day)  Even worse the following day   Helps to lie flat  Took flexeril (it makes her sleepy)- fri nt and sat nt  Did not do a lot over the weekend   Did use some heat after 2-3 d of ice and that felt good  Still having sharp pain under R shoulder blade   Patient Active Problem List   Diagnosis Date Noted  . Spasm of thoracic back muscle 08/11/2016  . Screening mammogram, encounter for 08/11/2016  . 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 Routine gynecological examination 11/07/2012  . Routine general medical examination at a health care facility 10/27/2012  . Migraine headache 06/24/2012  . Stress reaction 03/16/2012  . GERD (gastroesophageal reflux disease) 02/19/2012  . IBS (irritable bowel syndrome) 04/14/2011  . OBSTRUCTIVE SLEEP APNEA 09/05/2010  . BRUXISM 07/24/2010  . RESTLESS LEG SYNDROME 07/24/2010  . OTHER DYSPNEA AND RESPIRATORY ABNORMALITIES 07/08/2010  . ALLERGIC RHINITIS 12/17/2009  . NEOPLASM, SKIN, UNCERTAIN BEHAVIOR [id number]  . PTSD 10/20/2007  . FIBROCYSTIC BREAST DISEASE 10/20/2007  . DYSMENORRHEA 10/20/2007   Past Medical History:  Diagnosis Date  . Acid reflux    occasional  . Allergic rhinitis, seasonal    vasomotor rhinitis  . Anemia in the past   is a blood donor  . Constipation, chronic   . Dysmenorrhea   . Heart murmur    Negative echo in the past  . History of  chicken pox   . IBS (irritable bowel syndrome)   . Migraines   . OSA (obstructive sleep apnea)    PSG 08/20/10 RDI 18.7  . 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 PTSD (post-traumatic stress disorder)    with depressive symptoms  . Urticaria    stress induced   Past Surgical History:  Procedure Laterality Date  . WISDOM TOOTH EXTRACTION     Social History  Substance Use Topics  . Smoking status: Never Smoker  . Smokeless tobacco: Never Used  . Alcohol use 0.0 oz/week     Comment: Rare   Family History  Problem Relation Age of Onset  . Hypertension Father   . Allergies Father   . Other Mother     Digestive problems-w/u @ [facility]-? bact inf  . Diabetes      GM  . Prostate cancer      GF  . Anxiety disorder      diffusely in family  . Depression      diffusely in family  . Colon cancer Neg Hx    No Known Allergies Current Outpatient Prescriptions on File Prior to Visit  Medication Sig Dispense Refill  . acetaminophen (TYLENOL) 500 MG tablet Take 500 mg by mouth every 6 (  six) hours as needed. For headache    . clonazePAM (KLONOPIN) 0.5 MG tablet Take 0.5 mg by mouth at bedtime.     Marland Kitchen escitalopram (LEXAPRO) 10 MG tablet Take 10 mg by mouth daily.    . fluticasone (FLONASE) 50 MCG/ACT nasal spray Place 2 sprays into both nostrils daily. 16 g 11  . hyoscyamine (LEVSIN, ANASPAZ) 0.125 MG tablet TAKE 1 TABLET BY MOUTH EVERY 6 HOURS AS NEEDED FOR CRAMPING 30 tablet 5  . Ibuprofen 200 MG CAPS Take 400-600 mg by mouth as needed.    Marland Kitchen levonorgestrel-ethinyl estradiol (ORSYTHIA) 0.1-20 MG-MCG tablet Take 1 tablet by mouth daily. Take continuously without stopping for the placebo week 5 Package 3  . Melatonin 3 MG TABS Take 3-6 mg by mouth at bedtime as needed. For sleep    . omeprazole (PRILOSEC) 20 MG capsule Take 20 mg by mouth daily.    . ranitidine (ZANTAC) 150 MG tablet Take 150 mg by mouth 2 (two) times daily.    .  valACYclovir (VALTREX) 1000 MG tablet TAKE 2 TABLETS TWICE DAILY FOR 1 DAY AT FIRST SIGN OF A COLD SORE 12 tablet 3   No current facility-administered medications on file prior to visit.     Review of Systems Review of Systems  Constitutional: Negative for fever, appetite change, fatigue and unexpected weight change.  Eyes: Negative for pain and visual disturbance.  Respiratory: Negative for cough and shortness of breath.   Cardiovascular: Negative for cp or palpitations    Gastrointestinal: Negative for nausea, diarrhea and constipation.  Genitourinary: Negative for urgency and frequency.  Skin: Negative for pallor or rash   MSK pos for back pain  Neurological: Negative for weakness, light-headedness, numbness and headaches.  Hematological: Negative for adenopathy. Does not bruise/bleed easily.  Psychiatric/Behavioral: Negative for dysphoric mood. The patient is not nervous/anxious.         Objective:   Physical Exam  Constitutional: She appears well-developed and well-nourished. No distress.  obese and well appearing   HENT:  Head: Normocephalic and atraumatic.  Eyes: Conjunctivae and EOM are normal. Pupils are equal, round, and reactive to light. No scleral icterus.  Neck: Normal range of motion. Neck supple.  Cardiovascular: Normal rate and regular rhythm.   Pulmonary/Chest: Effort normal and breath sounds normal. She has no wheezes. She has no rales.  Abdominal: Soft. Bowel sounds are normal. She exhibits no distension. There is no tenderness.  Musculoskeletal: She exhibits tenderness.       Thoracic back: She exhibits decreased range of motion, tenderness, bony tenderness and spasm. She exhibits no edema.       Lumbar back: She exhibits decreased range of motion, tenderness and spasm. She exhibits no bony tenderness and no edema.  Tender over mid TS, moreso over R spinal musculature medial to scapula  Palpable spasm  Worse with flex of TS and CS  rom limited by pain Nl  shoulder exam and UE rom  No neuro changes     Lymphadenopathy:    She has no cervical adenopathy.  Neurological: She is alert. She has normal strength and normal reflexes. She displays no atrophy. No cranial nerve deficit or sensory deficit. She exhibits normal muscle tone. Coordination normal.  Negative SLR  Skin: Skin is warm and dry. No rash noted. No erythema. No pallor.  Psychiatric: She has a normal mood and affect.  Pleasant and talkative           Assessment & Plan:   Problem List  Items Addressed This Visit      Other   Screening mammogram, encounter for   Relevant Orders   MM DIGITAL SCREENING BILATERAL   Spasm of thoracic back muscle    With some improvement of symptoms after flexeril and heat Will use nsaid if tolerated Continue above Gentle stretching Re assuring exam (no neuro changes) Ref for PT eval and treat Update if not starting to improve in a week or if worsening        Relevant Orders   Ambulatory referral to Physical Therapy

## 2016-08-11 NOTE — Progress Notes (Signed)
Pre visit review using our clinic review tool, if applicable. No additional management support is needed unless otherwise documented below in the visit note. 

## 2016-08-11 NOTE — Assessment & Plan Note (Signed)
With some improvement of symptoms after flexeril and heat Will use nsaid if tolerated Continue above Gentle stretching Re assuring exam (no neuro changes) Ref for PT eval and treat Update if not starting to improve in a week or if worsening

## 2016-08-11 NOTE — Patient Instructions (Addendum)
Continue using heat on your back in 10 minute intervals  Flexeril as needed  Ibuprofen with food if it does not upset your stomach  Stop at check out for referral to physical therapy  If symptoms worsen or do not improve in several weeks please let me know  Stop at check out for mammogram referral

## 2016-09-16 ENCOUNTER — Ambulatory Visit
Admission: RE | Admit: 2016-09-16 | Discharge: 2016-09-16 | Disposition: A | Discharge status: Home / Self Care | Payer: BC Managed Care – PPO | Source: Ambulatory Visit | Attending: Family Medicine | Admitting: Family Medicine

## 2016-09-16 DIAGNOSIS — Z1231 Encounter for screening mammogram for malignant neoplasm of breast: Secondary | ICD-10-CM | POA: Insufficient documentation

## 2016-11-04 ENCOUNTER — Encounter: Payer: Self-pay | Admitting: Family Medicine

## 2016-11-04 ENCOUNTER — Ambulatory Visit (INDEPENDENT_AMBULATORY_CARE_PROVIDER_SITE_OTHER): Payer: BC Managed Care – PPO | Admitting: Family Medicine

## 2016-11-04 ENCOUNTER — Telehealth: Payer: Self-pay

## 2016-11-04 VITALS — BP 110/68 | HR 70 | Temp 98.2°F | Ht 62.25 in | Wt 176.5 lb

## 2016-11-04 DIAGNOSIS — A084 Viral intestinal infection, unspecified: Secondary | ICD-10-CM | POA: Diagnosis not present

## 2016-11-04 MED ORDER — PROMETHAZINE HCL 25 MG PO TABS: 25.0000 mg | ORAL_TABLET | Freq: Three times a day (TID) | ORAL | 0 refills | Status: DC | PRN

## 2016-11-04 NOTE — Progress Notes (Signed)
Subjective:    Patient ID: Debra Edwards, female    DOB: 02/15/1976, 41 y.o.   MRN: [id number]  HPI Here with n/v/d  Started -Monday- at first she thought it ws a side eff Then vomiting that evening  Temp 100.5 -aches/sweats  Very uncomfortable and could not sleep Then chills in am 101.5 temp  Took some tylenol - it helped her fall asleep and then she sweated off the fever   Woke up sweating last night and this am -but not vomiting all night  Having frequent watery bm Nauseated  Tried eating some bits of saltine crackers   Exhausted and hot and cold   Last vomiting 1:30 am tues am Dry heaved this am  Getting some ginger ale to stay down and then gatorade   Some abd cramping off and on    Wt Readings from Last 3 Encounters:  11/04/16 176 lb 8 oz (80.1 kg)  08/11/16 183 lb 12 oz (83.3 kg)  07/08/16 182 lb 6.4 oz (82.7 kg)   Temp: 98.2 F (36.8 C)   Of note-she started taking viibryd with psychiatrist (to replace citralopram)  She did get some nausea in the beginning as expected - taking it with food helped   Patient Active Problem List   Diagnosis Date Noted  . Spasm of thoracic back muscle 08/11/2016  . Screening mammogram, encounter for 08/11/2016  . 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 Routine gynecological examination 11/07/2012  . Routine general medical examination at a health care facility 10/27/2012  . Migraine headache 06/24/2012  . Stress reaction 03/16/2012  . GERD (gastroesophageal reflux disease) 02/19/2012  . IBS (irritable bowel syndrome) 04/14/2011  . OBSTRUCTIVE SLEEP APNEA 09/05/2010  . BRUXISM 07/24/2010  . RESTLESS LEG SYNDROME 07/24/2010  . OTHER DYSPNEA AND RESPIRATORY ABNORMALITIES 07/08/2010  . ALLERGIC RHINITIS 12/17/2009  . NEOPLASM, SKIN, UNCERTAIN BEHAVIOR [id number]  . PTSD 10/20/2007  . FIBROCYSTIC BREAST DISEASE 10/20/2007  . DYSMENORRHEA 10/20/2007   Past Medical History:  Diagnosis Date  . Acid reflux    occasional  . Allergic rhinitis, seasonal    vasomotor rhinitis  . Anemia in the past   is a blood donor  . Constipation, chronic   . Dysmenorrhea   . Heart murmur    Negative echo in the past  . History of chicken pox   . IBS (irritable bowel syndrome)   . Migraines   . OSA (obstructive sleep apnea)    PSG 08/20/10 RDI 18.7  . 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 PTSD (post-traumatic stress disorder)    with depressive symptoms  . Urticaria    stress induced   Past Surgical History:  Procedure Laterality Date  . WISDOM TOOTH EXTRACTION     Social History  Substance Use Topics  . Smoking status: Never Smoker  . Smokeless tobacco: Never Used  . Alcohol use 0.0 oz/week     Comment: Rare   Family History  Problem Relation Age of Onset  . Hypertension Father   . Allergies Father   . Other Mother     Digestive problems-w/u @ [place] [facility]-? bact inf  . Diabetes      GM  . Prostate cancer      GF  . Anxiety disorder      diffusely in family  . Depression      diffusely in family  . Colon cancer Neg Hx   .  Breast cancer Neg Hx    No Known Allergies Current Outpatient Prescriptions on File Prior to Visit  Medication Sig Dispense Refill  . acetaminophen (TYLENOL) 500 MG tablet Take 500 mg by mouth every 6 (six) hours as needed. For headache    . clonazePAM (KLONOPIN) 0.5 MG tablet Take 0.5 mg by mouth at bedtime.     . cyclobenzaprine (FLEXERIL) 10 MG tablet Take 1 tablet (10 mg total) by mouth 3 (three) times daily as needed. 30 tablet 1  . escitalopram (LEXAPRO) 10 MG tablet Take 10 mg by mouth daily.    . fluticasone (FLONASE) 50 MCG/ACT nasal spray Place 2 sprays into both nostrils daily. 16 g 11  . hyoscyamine (LEVSIN, ANASPAZ) 0.125 MG tablet TAKE 1 TABLET BY MOUTH EVERY 6 HOURS AS NEEDED FOR CRAMPING 30 tablet 5  . Ibuprofen 200 MG CAPS Take 400-600 mg by mouth as needed.    Marland Kitchen levonorgestrel-ethinyl  estradiol (ORSYTHIA) 0.1-20 MG-MCG tablet Take 1 tablet by mouth daily. Take continuously without stopping for the placebo week 5 Package 3  . Melatonin 3 MG TABS Take 3-6 mg by mouth at bedtime as needed. For sleep    . omeprazole (PRILOSEC) 20 MG capsule Take 20 mg by mouth daily.    . ranitidine (ZANTAC) 150 MG tablet Take 150 mg by mouth 2 (two) times daily.    . valACYclovir (VALTREX) 1000 MG tablet TAKE 2 TABLETS TWICE DAILY FOR 1 DAY AT FIRST SIGN OF A COLD SORE 12 tablet 3   No current facility-administered medications on file prior to visit.     Review of Systems Review of Systems  Constitutional: pos for elevated temp/ fatigue/malaise Eyes: Negative for pain and visual disturbance.  Respiratory: Negative for cough and shortness of breath.   Cardiovascular: Negative for cp or palpitations    Gastrointestinal: Negative for constipation/ blood In stool or black stool  Genitourinary: Negative for urgency and frequency.  Skin: Negative for pallor or rash   Neurological: Negative for weakness, light-headedness, numbness and headaches.  Hematological: Negative for adenopathy. Does not bruise/bleed easily.  Psychiatric/Behavioral: Negative for dysphoric mood. The patient is not nervous/anxious.         Objective:   Physical Exam  Constitutional: She appears well-developed and well-nourished. No distress.  Fatigued   HENT:  Head: Normocephalic and atraumatic.  Mouth/Throat: Oropharynx is clear and moist.  MMM Throat is clear  Eyes: Conjunctivae and EOM are normal. Pupils are equal, round, and reactive to light. No scleral icterus.  Neck: Normal range of motion. Neck supple.  Cardiovascular: Normal rate, regular rhythm and normal heart sounds.   Pulmonary/Chest: Effort normal and breath sounds normal. No respiratory distress. She has no wheezes. She has no rales.  Abdominal: Soft. Bowel sounds are normal. She exhibits no distension and no mass. There is no hepatosplenomegaly.  There is tenderness in the epigastric area. There is no rebound, no guarding, no CVA tenderness, no tenderness at McBurney's point and negative Murphy's sign.  Mild epigastric tenderness  Lymphadenopathy:    She has no cervical adenopathy.  Neurological: She is alert. No cranial nerve deficit.  Skin: Skin is warm and dry. No erythema. No pallor.  Nl skin color and turgor  Psychiatric: She has a normal mood and affect.          Assessment & Plan:   Problem List Items Addressed This Visit      Digestive   Viral gastroenteritis    With reassuring exam - improving  Disc prev of dehydration / and adv to [employer] when ready  Acetaminophen for fever/aches  Will watch for abd pain / severe headache /worse diarrhea or s/s of dehydration  Rest  See AVS Handouts given  Px for phenergan 25 mg for nausea with warning of sedation  Update if not starting to improve in a week or if worsening

## 2016-11-04 NOTE — Assessment & Plan Note (Signed)
With reassuring exam - improving  Disc prev of dehydration / and adv to [employer] when ready  Acetaminophen for fever/aches  Will watch for abd pain / severe headache /worse diarrhea or s/s of dehydration  Rest  See AVS Handouts given  Px for phenergan 25 mg for nausea with warning of sedation  Update if not starting to improve in a week or if worsening

## 2016-11-04 NOTE — Progress Notes (Signed)
Pre visit review using our clinic review tool, if applicable. No additional management support is needed unless otherwise documented below in the visit note. 

## 2016-11-04 NOTE — Telephone Encounter (Signed)
PLEASE NOTE: All timestamps contained within this report are represented as [place] Standard Time. CONFIDENTIALTY NOTICE: This fax transmission is intended only for the addressee. It contains information that is legally privileged, confidential or otherwise protected from use or disclosure. If you are not the intended recipient, you are strictly prohibited from reviewing, disclosing, copying using or disseminating any of this information or taking any action in reliance on or regarding this information. If you have received this fax in error, please notify us immediately by telephone so that we can arrange for its return to [place]. Phone: [phone], Toll-Free: [phone], Fax: [phone] Page: 1 of 2 Call Id: [id number] [place] Patient Name: Debra Edwards Gender: Female DOB: [birth year] Age: 41 Y 2 M 5 D Return Phone Number: [phone] (Primary) City/State/Zip: [place] Client [place] [facility] - Client Client Site [place] [facility] [place], Roque Lias - MD Who Is Calling Patient / Member / Family / Caregiver Call Type Triage / Clinical Relationship To Patient Self Return Phone Number [phone] (Primary) Chief Complaint Vomiting Reason for Call Symptomatic / Request for Health Information Initial Comment Caller States vomiting, diarrhea, Fever Nurse Assessment Nurse: Lavera Guise, RN, Vaughan Basta Date/Time (Eastern Time): 11/03/2016 5:04:52 PM Confirm and document reason for call. If symptomatic, describe symptoms. ---Caller states vomiting, diarrhea, fever. Started about 630 pm and started vomiting several times until 130 a.m. Stopped vomiting at 130 a.m. Fever 101.5 this a.m. Diarrhea several times today. Is urinating. Drinking ginger ale. No nasal symptoms. Does the PT have any chronic conditions? (i.e. diabetes, asthma, etc.) ---No Is the  patient pregnant or possibly pregnant? (Ask all females between the ages of 52-55) ---No Guidelines Guideline Title Affirmed Question Diarrhea SEVERE diarrhea (e.g., 7 or more times / day more than normal) (all triage questions negative) Disp. Time Eilene Ghazi Time) Disposition Final User 11/03/2016 5:18:08 [place], RN, [facility] Advice Given Per Guideline HOME CARE: You should be able to treat this at home. REASSURANCE: * Sometimes the cause is an infection caused by a virus ('stomach flu) or a bacteria. Diarrhea is one of the body's way of getting rid of germs. * Staying well-hydrated is the key for adults with diarrhea. From what you have told me, it sounds like you are not severely dehydrated at this point. FLUID THERAPY DURING SEVERE DIARRHEA: * Drink more fluids, at least 8-10 cups daily. One cup equals 8 oz (240 ml). * WATER: Even for severe diarrhea, water is often the best liquid to drink. You should also eat some salty foods (e.g., potato chips, pretzels, saltine crackers). This is important to make sure you are getting enough salt, sugars, and fluids to meet your body's needs. * Avoid caffeinated beverages (Reason: caffeine is mildly dehydrating). * Drinking enough liquids is more important that eating when one has severe diarrhea. FOOD AND NUTRITION DURING SEVERE DIARRHEA: * As the diarrhea starts to get better, you can slowly return to a normal diet. * Begin with boiled starches / cereals (e.g., potatoes, rice, noodles, wheat, oats) with a small amount of salt to taste. * Other foods that are OK include: bananas, yogurt, crackers, soup. OTC MEDS - Loperamide (Imodium AD): * Helps reduce diarrhea. * Adult dosage: 4 mg (2 capsules or 4 teaspoons or 20 ml) is the recommended first dose. You may take an additional 2 mg (1 capsule or 2 teaspoons or 10 ml)  after each loose BM. * Maximum dosage: 16 mg (8 capsules or 16 teaspoons or 80 ml). * Do not use for more than 2 days.  CONTAGIOUSNESS: * Be certain to wash your hands after using the restroom. EXPECTED COURSE: Viral diarrhea lasts 4-7 days. Always worse on days 1 and 2. CALL BACK IF: * Signs of dehydration occur (e.g., no urine over 12 PLEASE NOTE: All timestamps contained within this report are represented as [place] Standard Time. CONFIDENTIALTY NOTICE: This fax transmission is intended only for the addressee. It contains information that is legally privileged, confidential or otherwise protected from use or disclosure. If you are not the intended recipient, you are strictly prohibited from reviewing, disclosing, copying using or disseminating any of this information or taking any action in reliance on or regarding this information. If you have received this fax in error, please notify us immediately by telephone so that we can arrange for its return to [place]. Phone: [phone], Toll-Free: [phone], Fax: [phone] Page: 2 of 2 Call Id: [id number] Care Advice Given Per Guideline hours, very dry mouth, lightheaded, etc.) * Diarrhea persists over 7 days * You become worse. CARE ADVICE given per Diarrhea (Adult) guideline.

## 2016-11-04 NOTE — Patient Instructions (Addendum)
Start sipping fluids to prevent dehydration  Try the phenergan for nausea  When improved- BRAT diet - banana/rice/apple sauce and toast  Do not advance diet too quickly  Lots of rest  If diarrhea gets out of control or you develop abdominal pain please alert [place]   Wipe down surfaces with bleach cleanser  You are contagious    Norovirus Infection A norovirus infection is caused by exposure to a virus in a group of similar viruses (noroviruses). This type of infection causes inflammation in your stomach and intestines (gastroenteritis). Norovirus is the most common cause of gastroenteritis. It also causes food poisoning. Anyone can get a norovirus infection. It spreads very easily (contagious). You can get it from contaminated food, water, surfaces, or other people. Norovirus is found in the stool or vomit of infected people. You can spread the infection as soon as you feel sick until 2 weeks after you recover.  Symptoms usually begin within 2 days after you become infected. Most norovirus symptoms affect the digestive system. CAUSES Norovirus infection is caused by contact with norovirus. You can catch norovirus if you:  Eat or drink something contaminated with norovirus.  Touch surfaces or objects contaminated with norovirus and then put your hand in your mouth.  Have direct contact with an infected person who has symptoms.  Share food, drink, or utensils with someone with who is sick with norovirus. SIGNS AND SYMPTOMS Symptoms of norovirus may include:  Nausea.  Vomiting.  Diarrhea.  Stomach cramps.  Fever.  Chills.  Headache.  Muscle aches.  Tiredness. DIAGNOSIS Your health care provider may suspect norovirus based on your symptoms and physical exam. Your health care provider may also test a sample of your stool or vomit for the virus.  TREATMENT There is no specific treatment for norovirus. Most people get better without treatment in about 2 days. HOME CARE  INSTRUCTIONS  Replace lost fluids by drinking plenty of water or rehydration fluids containing important minerals called electrolytes. This prevents dehydration. Drink enough fluid to keep your urine clear or pale yellow.  Do not prepare food for others while you are infected. Wait at least 3 days after recovering from the illness to do that. PREVENTION   Wash your hands often, especially after using the toilet or changing a diaper.  Wash fruits and vegetables thoroughly before preparing or serving them.  Throw out any food that a sick person may have touched.  Disinfect contaminated surfaces immediately after someone in the household has been sick. Use a bleach-based household cleaner.  Immediately remove and wash soiled clothes or sheets. SEEK MEDICAL CARE IF:  Your vomiting, diarrhea, and stomach pain is getting worse.  Your symptoms of norovirus do not go away after 2-3 days. SEEK IMMEDIATE MEDICAL CARE IF:  You develop symptoms of dehydration that do not improve with fluid replacement. This may include:  Excessive sleepiness.  Lack of tears.  Dry mouth.  Dizziness when standing.  Weak pulse. This information is not intended to replace advice given to you by your health care provider. Make sure you discuss any questions you have with your health care provider. Document Released: 12/12/2002 Document Revised: 10/12/2014 Document Reviewed: 03/01/2014 Elsevier Interactive Patient Education  2017 [employer].

## 2016-11-11 ENCOUNTER — Telehealth: Payer: Self-pay

## 2016-11-11 NOTE — Telephone Encounter (Signed)
The flu is usually abrupt onset of fever over 100.5 (often up to 102) with body aches and chills/ cough and some runny nose and fever  I recommend acetaminophen and fluids and rest  If this is another resp virus - temp will usually stay low grade F/u if symptoms worsen

## 2016-11-11 NOTE — Telephone Encounter (Signed)
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 Pt wants to know if should get tested for the flu or should pt stay home on 10/12/16 since has fever.pt request cb.[facility].

## 2016-11-11 NOTE — Telephone Encounter (Signed)
Pt notified of Dr. Marliss Coots comments and recommendations and verbalized understanding pt will call back if she worsen and needs to schedule a f/u

## 2016-12-09 ENCOUNTER — Encounter: Payer: Self-pay | Admitting: Nurse Practitioner

## 2016-12-09 ENCOUNTER — Ambulatory Visit (INDEPENDENT_AMBULATORY_CARE_PROVIDER_SITE_OTHER): Payer: BC Managed Care – PPO | Admitting: Nurse Practitioner

## 2016-12-09 VITALS — BP 110/72 | HR 66 | Temp 97.7°F | Ht 62.25 in | Wt 177.0 lb

## 2016-12-09 DIAGNOSIS — H8112 Benign paroxysmal vertigo, left ear: Secondary | ICD-10-CM | POA: Diagnosis not present

## 2016-12-09 DIAGNOSIS — H6983 Other specified disorders of Eustachian tube, bilateral: Secondary | ICD-10-CM

## 2016-12-09 MED ORDER — MECLIZINE HCL 12.5 MG PO TABS: 12.5000 mg | ORAL_TABLET | Freq: Three times a day (TID) | ORAL | 0 refills | Status: DC | PRN

## 2016-12-09 MED ORDER — FLUTICASONE PROPIONATE 50 MCG/ACT NA SUSP: 2.0000 | Freq: Every day | NASAL | 11 refills | Status: DC

## 2016-12-09 NOTE — Progress Notes (Signed)
Subjective:  Patient ID: Debra Edwards, female    DOB: 04/01/1976  Age: 41 y.o. MRN: [id number]  CC: Follow-up (ear fullness,had vertigo twice today--almost vomit, room spinning. )   Dizziness  This is a recurrent problem. The current episode started yesterday. The problem has been waxing and waning. Associated symptoms include nausea and vertigo. Pertinent negatives include no abdominal pain, anorexia, arthralgias, change in bowel habit, chest pain, congestion, coughing, diaphoresis, fatigue, fever, headaches, joint swelling, myalgias, neck pain, numbness, rash, sore throat, swollen glands, urinary symptoms, visual change, vomiting or weakness. Exacerbated by: head movement to left. She has tried rest and position changes (Epley maneuver) for the symptoms. The treatment provided mild relief.    Outpatient Medications Prior to Visit  Medication Sig Dispense Refill  . acetaminophen (TYLENOL) 500 MG tablet Take 500 mg by mouth every 6 (six) hours as needed. For headache    . clonazePAM (KLONOPIN) 0.5 MG tablet Take 0.5 mg by mouth at bedtime.     . cyclobenzaprine (FLEXERIL) 10 MG tablet Take 1 tablet (10 mg total) by mouth 3 (three) times daily as needed. 30 tablet 1  . escitalopram (LEXAPRO) 10 MG tablet Take 10 mg by mouth daily.    . hyoscyamine (LEVSIN, ANASPAZ) 0.125 MG tablet TAKE 1 TABLET BY MOUTH EVERY 6 HOURS AS NEEDED FOR CRAMPING 30 tablet 5  . Ibuprofen 200 MG CAPS Take 400-600 mg by mouth as needed.    Marland Kitchen levonorgestrel-ethinyl estradiol (ORSYTHIA) 0.1-20 MG-MCG tablet Take 1 tablet by mouth daily. Take continuously without stopping for the placebo week 5 Package 3  . Melatonin 3 MG TABS Take 3-6 mg by mouth at bedtime as needed. For sleep    . omeprazole (PRILOSEC) 20 MG capsule Take 20 mg by mouth daily.    . promethazine (PHENERGAN) 25 MG tablet Take 1 tablet (25 mg total) by mouth every 8 (eight) hours as needed for nausea or vomiting. Caution of sedation 30 tablet  0  . ranitidine (ZANTAC) 150 MG tablet Take 150 mg by mouth 2 (two) times daily.    . valACYclovir (VALTREX) 1000 MG tablet TAKE 2 TABLETS TWICE DAILY FOR 1 DAY AT FIRST SIGN OF A COLD SORE 12 tablet 3  . Vilazodone HCl (VIIBRYD PO) Take by mouth. Take as directed by psychiatrist    . fluticasone (FLONASE) 50 MCG/ACT nasal spray Place 2 sprays into both nostrils daily. 16 g 11   No facility-administered medications prior to visit.     ROS See HPI  Objective:  BP 110/72   Pulse 66   Temp 97.7 F (36.5 C)   Ht 5' 2.25" (1.581 m)   Wt 177 lb (80.3 kg)   SpO2 98%   BMI 32.11 kg/m   BP Readings from Last 3 Encounters:  12/09/16 110/72  11/04/16 110/68  08/11/16 104/62    Wt Readings from Last 3 Encounters:  12/09/16 177 lb (80.3 kg)  11/04/16 176 lb 8 oz (80.1 kg)  08/11/16 183 lb 12 oz (83.3 kg)    Physical Exam  Constitutional: She is oriented to person, place, and time. No distress.  HENT:  Right Ear: External ear normal.  Left Ear: External ear normal.  Nose: Nose normal. Right sinus exhibits no maxillary sinus tenderness and no frontal sinus tenderness. Left sinus exhibits no maxillary sinus tenderness and no frontal sinus tenderness.  Mouth/Throat: Oropharynx is clear and moist. No oropharyngeal exudate.  Eyes: Conjunctivae and EOM are normal. Pupils are equal, round,  and reactive to light. No scleral icterus.  Neck: Normal range of motion. Neck supple.  Cardiovascular: Normal rate.   Pulmonary/Chest: Effort normal.  Musculoskeletal: Normal range of motion.  Lymphadenopathy:    She has no cervical adenopathy.  Neurological: She is alert and oriented to person, place, and time. No cranial nerve deficit. Coordination normal.  Skin: Skin is warm and dry.  Vitals reviewed.   Lab Results  Component Value Date   WBC 5.0 07/03/2016   HGB 12.9 07/03/2016   HCT 37.2 07/03/2016   PLT 225.0 07/03/2016   GLUCOSE 80 07/03/2016   CHOL 151 07/03/2016   TRIG 144.0  07/03/2016   HDL 53.30 07/03/2016   LDLCALC 69 07/03/2016   ALT 21 07/03/2016   AST 21 07/03/2016   NA 139 07/03/2016   K 3.9 07/03/2016   CL 104 07/03/2016   CREATININE 0.82 07/03/2016   BUN 10 07/03/2016   CO2 29 07/03/2016   TSH 3.24 07/03/2016    Mm Screening Breast Tomo Bilateral  Result Date: 09/16/2016 CLINICAL DATA:  Screening. Baseline exam. EXAM: 2D DIGITAL SCREENING BILATERAL MAMMOGRAM WITH CAD AND ADJUNCT TOMO COMPARISON:  None. ACR Breast Density Category c: The breast tissue is heterogeneously dense, which may obscure small masses FINDINGS: There are no findings suspicious for malignancy. Images were processed with CAD. IMPRESSION: No mammographic evidence of malignancy. A result letter of this screening mammogram will be mailed directly to the patient. RECOMMENDATION: Screening mammogram in one year. (Code:SM-B-01Y) BI-RADS CATEGORY  1: Negative. Electronically Signed   By: Pamelia Hoit M.D.   On: 09/16/2016 16:38    Assessment & Plan:   Debra Edwards was seen today for follow-up.  Diagnoses and all orders for this visit:  BPPV (benign paroxysmal positional vertigo), left -     meclizine (ANTIVERT) 12.5 MG tablet; Take 1 tablet (12.5 mg total) by mouth 3 (three) times daily as needed for dizziness. -     fluticasone (FLONASE) 50 MCG/ACT nasal spray; Place 2 sprays into both nostrils daily.  Eustachian tube dysfunction, bilateral -     meclizine (ANTIVERT) 12.5 MG tablet; Take 1 tablet (12.5 mg total) by mouth 3 (three) times daily as needed for dizziness. -     fluticasone (FLONASE) 50 MCG/ACT nasal spray; Place 2 sprays into both nostrils daily.   I am having Debra Edwards start on meclizine. I am also having her maintain her clonazePAM, Melatonin, acetaminophen, Ibuprofen, ranitidine, omeprazole, escitalopram, levonorgestrel-ethinyl estradiol, hyoscyamine, valACYclovir, cyclobenzaprine, Vilazodone HCl (VIIBRYD PO), promethazine, Eszopiclone, and fluticasone.  Meds  ordered this encounter  Medications  . Eszopiclone 3 MG TABS    Sig: Take 3 mg by mouth once as needed.  . meclizine (ANTIVERT) 12.5 MG tablet    Sig: Take 1 tablet (12.5 mg total) by mouth 3 (three) times daily as needed for dizziness.    Dispense:  30 tablet    Refill:  0    Order Specific Question:   Supervising Provider    Answer:   Cassandria Anger [[id number]]  . fluticasone (FLONASE) 50 MCG/ACT nasal spray    Sig: Place 2 sprays into both nostrils daily.    Dispense:  16 g    Refill:  11    Order Specific Question:   Supervising Provider    Answer:   Cassandria Anger [[id number]]    Follow-up: Return if symptoms worsen or fail to improve.  Wilfred Lacy, NP

## 2016-12-09 NOTE — Progress Notes (Signed)
Pre visit review using our clinic review tool, if applicable. No additional management support is needed unless otherwise documented below in the visit note. 

## 2016-12-09 NOTE — Patient Instructions (Addendum)
Provided printed instruction on epley exercise at home.  Benign Positional Vertigo Vertigo is the feeling that you or your surroundings are moving when they are not. Benign positional vertigo is the most common form of vertigo. The cause of this condition is not serious (is benign). This condition is triggered by certain movements and positions (is positional). This condition can be dangerous if it occurs while you are doing something that could endanger you or others, such as driving. What are the causes? In many cases, the cause of this condition is not known. It may be caused by a disturbance in an area of the inner ear that helps your brain to sense movement and balance. This disturbance can be caused by a viral infection (labyrinthitis), head injury, or repetitive motion. What increases the risk? This condition is more likely to develop in:  Women.  People who are 42 years of age or older. What are the signs or symptoms? Symptoms of this condition usually happen when you move your head or your eyes in different directions. Symptoms may start suddenly, and they usually last for less than a minute. Symptoms may include:  Loss of balance and falling.  Feeling like you are spinning or moving.  Feeling like your surroundings are spinning or moving.  Nausea and vomiting.  Blurred vision.  Dizziness.  Involuntary eye movement (nystagmus). Symptoms can be mild and cause only slight annoyance, or they can be severe and interfere with daily life. Episodes of benign positional vertigo may return (recur) over time, and they may be triggered by certain movements. Symptoms may improve over time. How is this diagnosed? This condition is usually diagnosed by medical history and a physical exam of the head, neck, and ears. You may be referred to a health care provider who specializes in ear, nose, and throat (ENT) problems (otolaryngologist) or a provider who specializes in disorders of the nervous  system (neurologist). You may have additional testing, including:  MRI.  A CT scan.  Eye movement tests. Your health care provider may ask you to change positions quickly while he or she watches you for symptoms of benign positional vertigo, such as nystagmus. Eye movement may be tested with an electronystagmogram (ENG), caloric stimulation, the Dix-Hallpike test, or the roll test.  An electroencephalogram (EEG). This records electrical activity in your brain.  Hearing tests. How is this treated? Usually, your health care provider will treat this by moving your head in specific positions to adjust your inner ear back to normal. Surgery may be needed in severe cases, but this is rare. In some cases, benign positional vertigo may resolve on its own in 2-4 weeks. Follow these instructions at home: Safety   Move slowly.Avoid sudden body or head movements.  Avoid driving.  Avoid operating heavy machinery.  Avoid doing any tasks that would be dangerous to you or others if a vertigo episode would occur.  If you have trouble walking or keeping your balance, try using a cane for stability. If you feel dizzy or unstable, sit down right away.  Return to your normal activities as told by your health care provider. Ask your health care provider what activities are safe for you. General instructions   Take over-the-counter and prescription medicines only as told by your health care provider.  Avoid certain positions or movements as told by your health care provider.  Drink enough fluid to keep your urine clear or pale yellow.  Keep all follow-up visits as told by your health care  provider. This is important. Contact a health care provider if:  You have a fever.  Your condition gets worse or you develop new symptoms.  Your family or friends notice any behavioral changes.  Your nausea or vomiting gets worse.  You have numbness or a "pins and needles" sensation. Get help right away  if:  You have difficulty speaking or moving.  You are always dizzy.  You faint.  You develop severe headaches.  You have weakness in your legs or arms.  You have changes in your hearing or vision.  You develop a stiff neck.  You develop sensitivity to light. This information is not intended to replace advice given to you by your health care provider. Make sure you discuss any questions you have with your health care provider. Document Released: 06/29/2006 Document Revised: 02/27/2016 Document Reviewed: 01/14/2015 Elsevier Interactive Patient Education  2017 [employer].

## 2017-01-19 ENCOUNTER — Telehealth: Payer: Self-pay | Admitting: Family Medicine

## 2017-01-19 ENCOUNTER — Encounter: Payer: Self-pay | Admitting: Family Medicine

## 2017-01-19 ENCOUNTER — Ambulatory Visit (INDEPENDENT_AMBULATORY_CARE_PROVIDER_SITE_OTHER): Payer: BC Managed Care – PPO | Admitting: Family Medicine

## 2017-01-19 DIAGNOSIS — R05 Cough: Secondary | ICD-10-CM | POA: Insufficient documentation

## 2017-01-19 DIAGNOSIS — R059 Cough, unspecified: Secondary | ICD-10-CM

## 2017-01-19 MED ORDER — ALBUTEROL SULFATE HFA 108 (90 BASE) MCG/ACT IN AERS: 2.0000 | INHALATION_SPRAY | Freq: Four times a day (QID) | RESPIRATORY_TRACT | 0 refills | Status: DC | PRN

## 2017-01-19 NOTE — Progress Notes (Signed)
Pre visit review using our clinic review tool, if applicable. No additional management support is needed unless otherwise documented below in the visit note. 

## 2017-01-19 NOTE — Assessment & Plan Note (Signed)
Most likely post infectious bronchospasm. No sign of infection.  Possible allergy contribution.  Treat with nasal steroid, antihistamine and albuterol for coughing fits.  Discussed time course of recovery with pt .Marland Kitchen Expect months for dry cough.

## 2017-01-19 NOTE — Telephone Encounter (Signed)
Patient Name: TIMESHA CERVANTEZ DOB: 03-10-1976 Initial Comment Caller states that she has a long lasting cold. She now has a wet cough. Nurse Assessment Nurse: Vallery Sa, RN, Cathy Date/Time (Eastern Time): 01/19/2017 11:09:22 AM Confirm and document reason for call. If symptomatic, describe symptoms. ---Anderson Malta states she developed cold symptoms about a month ago and a productive cough about 2 weeks ago. No severe breathing difficulty. No fever. Alert and responsive. Does the patient have any new or worsening symptoms? ---Yes Will a triage be completed? ---Yes Related visit to physician within the last 2 weeks? ---Yes Does the PT have any chronic conditions? (i.e. diabetes, asthma, etc.) ---Yes List chronic conditions. ---PTSD, IBS, Sleep Apnea Is the patient pregnant or possibly pregnant? (Ask all females between the ages of 26-55) ---No Is this a behavioral health or substance abuse call? ---No Guidelines Guideline Title Affirmed Question Affirmed Notes Cough - Acute Productive SEVERE coughing spells (e.g., whooping sound after coughing, vomiting after coughing) Final Disposition User See Physician within [place], RN, Tye [place] Comments Scheduled for 2pm appointment with Dr Diona Browner 01/19/17. Referrals REFERRED TO PCP OFFICE Disagree/Comply: Comply

## 2017-01-19 NOTE — Progress Notes (Signed)
   Subjective:    Patient ID: Debra Edwards, female    DOB: [birth year], 41 y.o.   MRN: [id number]  Cough  This is a new problem. The current episode started 1 to 4 weeks ago. The problem has been unchanged (initially felt worse.. saw ENT.Marland Kitchen treated with antibitoics... still with mist cough). The cough is productive of sputum. Associated symptoms include ear congestion and nasal congestion. Pertinent negatives include no ear pain, fever, hemoptysis, myalgias, sore throat, shortness of breath or wheezing. Associated symptoms comments: Fatigued  coughing fits.. The symptoms are aggravated by lying down. Risk factors: nonsmoker. She has tried OTC cough suppressant (guafenisin) for the symptoms. There is no history of asthma, COPD, emphysema, environmental allergies or pneumonia.      Review of Systems  Constitutional: Negative for fever.  HENT: Negative for ear pain and sore throat.   Respiratory: Positive for cough. Negative for hemoptysis, shortness of breath and wheezing.   Musculoskeletal: Negative for myalgias.  Allergic/Immunologic: Negative for environmental allergies.       Objective:   Physical Exam  Constitutional: Vital signs are normal. She appears well-developed and well-nourished. She is cooperative.  Non-toxic appearance. She does not appear ill. No distress.  HENT:  Head: Normocephalic.  Right Ear: Hearing, external ear and ear canal normal. Tympanic membrane is not erythematous, not retracted and not bulging. A middle ear effusion is present.  Left Ear: Hearing, external ear and ear canal normal. Tympanic membrane is not erythematous, not retracted and not bulging. A middle ear effusion is present.  Nose: Mucosal edema present. No rhinorrhea. Right sinus exhibits maxillary sinus tenderness. Right sinus exhibits no frontal sinus tenderness. Left sinus exhibits maxillary sinus tenderness. Left sinus exhibits no frontal sinus tenderness.  Mouth/Throat: Uvula is  midline, oropharynx is clear and moist and mucous membranes are normal.  Eyes: Conjunctivae, EOM and lids are normal. Pupils are equal, round, and reactive to light. Lids are everted and swept, no foreign bodies found.  Neck: Trachea normal and normal range of motion. Neck supple. Carotid bruit is not present. No thyroid mass and no thyromegaly present.  Cardiovascular: Normal rate, regular rhythm, S1 normal, S2 normal, normal heart sounds, intact distal pulses and normal pulses.  Exam reveals no gallop and no friction rub.   No murmur heard. Pulmonary/Chest: Effort normal and breath sounds normal. No tachypnea. No respiratory distress. She has no decreased breath sounds. She has no wheezes. She has no rhonchi. She has no rales.  Abdominal: Soft. Normal appearance and bowel sounds are normal. There is no tenderness.  Neurological: She is alert.  Skin: Skin is warm, dry and intact. No rash noted.  Psychiatric: Her speech is normal and behavior is normal. Judgment and thought content normal. Her mood appears not anxious. Cognition and memory are normal. She does not exhibit a depressed mood.          Assessment & Plan:

## 2017-01-19 NOTE — Patient Instructions (Addendum)
Start zyrtec at bedtime for allergy possibility. Flonse 2 sprays pre nostril daily  Nasal saline spray 2-3 times daily.  Albuterol inhaler as needed for bronchospasm causing coughing fits..  Call if not  Improving as expected or sooner if fever > 101.4 F

## 2017-01-19 NOTE — Telephone Encounter (Signed)
Pt has appt 01/19/17 at 2 pm with Dr Diona Browner.

## 2017-03-23 ENCOUNTER — Telehealth: Payer: Self-pay

## 2017-03-23 NOTE — Telephone Encounter (Signed)
She takes the pill continuously - please confirm that before April she did not have spotting If so- hold it for 7 days so your body can go ahead and have a period Then start it back regularly and alert me if spotting or bleeding continue

## 2017-03-23 NOTE — Telephone Encounter (Signed)
Patient left a VM states she was put on an abx on April for a sinusitis and she's been spotting since then and it has gotten worse. She said there's no chance that she can be pregnant. She would like to know if she needs to go on a new birth control pill or does she need to come in?

## 2017-03-24 NOTE — Telephone Encounter (Signed)
Pt said she did have a small amount of spotting before April and then after April it became worse, pt will hold BCP for 7 days and update [place] if she is still having spotting once she restarts pills

## 2017-04-21 NOTE — Telephone Encounter (Signed)
Pt had appt same day.

## 2017-07-04 ENCOUNTER — Telehealth: Payer: Self-pay | Admitting: Family Medicine

## 2017-07-04 DIAGNOSIS — Z0000 Encounter for general adult medical examination without abnormal findings: Secondary | ICD-10-CM

## 2017-07-04 NOTE — Telephone Encounter (Signed)
-----   Message from Ellamae Sia sent at 06/30/2017  2:26 PM EDT ----- Regarding: Lab orders for Wednesday, 10.3.18 Patient is scheduled for CPX labs, please order future labs, Thanks , Karna Christmas

## 2017-07-07 ENCOUNTER — Other Ambulatory Visit (INDEPENDENT_AMBULATORY_CARE_PROVIDER_SITE_OTHER): Payer: BC Managed Care – PPO

## 2017-07-07 ENCOUNTER — Other Ambulatory Visit: Payer: BC Managed Care – PPO

## 2017-07-07 DIAGNOSIS — Z0000 Encounter for general adult medical examination without abnormal findings: Secondary | ICD-10-CM | POA: Diagnosis not present

## 2017-07-07 LAB — COMPREHENSIVE METABOLIC PANEL
ALT: 16 U/L (ref 0–35)
AST: 17 U/L (ref 0–37)
Albumin: 4 g/dL (ref 3.5–5.2)
Alkaline Phosphatase: 39 U/L (ref 39–117)
BILIRUBIN TOTAL: 0.5 mg/dL (ref 0.2–1.2)
BUN: 8 mg/dL (ref 6–23)
CALCIUM: 9 mg/dL (ref 8.4–10.5)
CO2: 27 meq/L (ref 19–32)
CREATININE: 0.82 mg/dL (ref 0.40–1.20)
Chloride: 104 mEq/L (ref 96–112)
GFR: 81.31 mL/min (ref >60.00)
GLUCOSE: 92 mg/dL (ref 70–99)
Potassium: 3.8 mEq/L (ref 3.5–5.1)
Sodium: 138 mEq/L (ref 135–145)
TOTAL PROTEIN: 6.6 g/dL (ref 6.0–8.3)

## 2017-07-07 LAB — CBC WITH DIFFERENTIAL/PLATELET
BASOS ABS: 0 10*3/uL (ref 0.0–0.1)
Basophils Relative: 0.3 % (ref 0.0–3.0)
EOS ABS: 0.1 10*3/uL (ref 0.0–0.7)
Eosinophils Relative: 2 % (ref 0.0–5.0)
HEMATOCRIT: 39.2 % (ref 36.0–46.0)
Hemoglobin: 13.1 g/dL (ref 12.0–15.0)
LYMPHS ABS: 1.4 10*3/uL (ref 0.7–4.0)
Lymphocytes Relative: 34.8 % (ref 12.0–46.0)
MCHC: 33.5 g/dL (ref 30.0–36.0)
MCV: 88 fl (ref 78.0–100.0)
Monocytes Absolute: 0.3 10*3/uL (ref 0.1–1.0)
Monocytes Relative: 7.4 % (ref 3.0–12.0)
NEUTROS PCT: 55.5 % (ref 43.0–77.0)
Neutro Abs: 2.2 10*3/uL (ref 1.4–7.7)
PLATELETS: 211 10*3/uL (ref 150.0–400.0)
RBC: 4.45 Mil/uL (ref 3.87–5.11)
RDW: 12.7 % (ref 11.5–15.5)
WBC: 3.9 10*3/uL — ABNORMAL LOW (ref 4.0–10.5)

## 2017-07-07 LAB — LIPID PANEL
CHOL/HDL RATIO: 3
Cholesterol: 134 mg/dL (ref 0–200)
HDL: 52.1 mg/dL (ref >39.00)
LDL Cholesterol: 68 mg/dL (ref 0–99)
NONHDL: 82.26
Triglycerides: 73 mg/dL (ref 0.0–149.0)
VLDL: 14.6 mg/dL (ref 0.0–40.0)

## 2017-07-07 LAB — TSH: TSH: 2.69 u[IU]/mL (ref 0.35–4.50)

## 2017-07-14 ENCOUNTER — Encounter: Payer: Self-pay | Admitting: Family Medicine

## 2017-07-14 ENCOUNTER — Ambulatory Visit (INDEPENDENT_AMBULATORY_CARE_PROVIDER_SITE_OTHER): Payer: BC Managed Care – PPO | Admitting: Family Medicine

## 2017-07-14 VITALS — BP 104/64 | HR 62 | Temp 98.5°F | Ht 62.25 in | Wt 166.2 lb

## 2017-07-14 DIAGNOSIS — K589 Irritable bowel syndrome without diarrhea: Secondary | ICD-10-CM

## 2017-07-14 DIAGNOSIS — Z01419 Encounter for gynecological examination (general) (routine) without abnormal findings: Secondary | ICD-10-CM

## 2017-07-14 DIAGNOSIS — Z0000 Encounter for general adult medical examination without abnormal findings: Secondary | ICD-10-CM | POA: Diagnosis not present

## 2017-07-14 DIAGNOSIS — F4310 Post-traumatic stress disorder, unspecified: Secondary | ICD-10-CM

## 2017-07-14 DIAGNOSIS — N946 Dysmenorrhea, unspecified: Secondary | ICD-10-CM

## 2017-07-14 MED ORDER — LEVONORGESTREL-ETHINYL ESTRAD 0.1-20 MG-MCG PO TABS: 1.0000 | ORAL_TABLET | Freq: Every day | ORAL | 3 refills | Status: DC

## 2017-07-14 NOTE — Patient Instructions (Signed)
Take care of yourself  Keep eating clean and exercising  Continue follow up with your mental health staff   Keep meditating as well   Labs look good   Alert me if any menstrual problems as well  Continue current oral contraception

## 2017-07-14 NOTE — Assessment & Plan Note (Signed)
Improved now on continuous oral contraceptive

## 2017-07-14 NOTE — Assessment & Plan Note (Signed)
Much improved-now on continuous OC / no menses Continue to follow  Nl bimanual pelvic exam today
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
## 2017-07-14 NOTE — Assessment & Plan Note (Signed)
Continues f/u with psychiatry and counseling  Rev depression screening  Had a bad week-doing better now  Will continue f/u with her mental health team
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
## 2017-07-14 NOTE — Assessment & Plan Note (Signed)
Reviewed health habits including diet and exercise and skin cancer prevention Reviewed appropriate screening tests for age  Also reviewed health mt list, fam hx and immunization status , as well as social and family history   Labs reviewed Commended better health habits Gyn (bimanual) exam done Will get mammogram in Dec

## 2017-08-07 ENCOUNTER — Other Ambulatory Visit: Payer: Self-pay | Admitting: Family Medicine

## 2017-10-07 ENCOUNTER — Other Ambulatory Visit: Payer: Self-pay | Admitting: Family Medicine

## 2018-01-17 ENCOUNTER — Other Ambulatory Visit: Payer: Self-pay | Admitting: Family Medicine

## 2018-01-17 DIAGNOSIS — Z1231 Encounter for screening mammogram for malignant neoplasm of breast: Secondary | ICD-10-CM

## 2018-02-14 ENCOUNTER — Ambulatory Visit
Admission: RE | Admit: 2018-02-14 | Discharge: 2018-02-14 | Disposition: A | Discharge status: Home / Self Care | Payer: BC Managed Care – PPO | Source: Ambulatory Visit | Attending: Family Medicine | Admitting: Family Medicine

## 2018-02-14 DIAGNOSIS — Z1231 Encounter for screening mammogram for malignant neoplasm of breast: Secondary | ICD-10-CM | POA: Diagnosis present

## 2018-02-25 ENCOUNTER — Ambulatory Visit: Payer: BC Managed Care – PPO | Admitting: Family Medicine

## 2018-02-25 ENCOUNTER — Encounter: Payer: Self-pay | Admitting: Family Medicine

## 2018-02-25 VITALS — BP 100/62 | HR 67 | Temp 97.9°F | Ht 62.25 in | Wt 169.0 lb

## 2018-02-25 DIAGNOSIS — B3731 Acute candidiasis of vulva and vagina: Secondary | ICD-10-CM

## 2018-02-25 DIAGNOSIS — N898 Other specified noninflammatory disorders of vagina: Secondary | ICD-10-CM

## 2018-02-25 DIAGNOSIS — B373 Candidiasis of vulva and vagina: Secondary | ICD-10-CM | POA: Diagnosis not present

## 2018-02-25 MED ORDER — FLUCONAZOLE 150 MG PO TABS: 150.0000 mg | ORAL_TABLET | Freq: Once | ORAL | 0 refills | Status: AC

## 2018-02-25 NOTE — Patient Instructions (Addendum)
Good to see you today  I have sent a medication for yeast to your pharmacy  I will notify you of vaginal discharge test results

## 2018-02-25 NOTE — Progress Notes (Signed)
Subjective:    Patient ID: Debra Edwards, female    DOB: Jan 19, 1976, 42 y.o.   MRN: [id number]  HPI This is a 42 yo female who presents today with vaginal itching and discharge for 5 days.  Tried some monistat cream and suppositories. Some improvement but has noticed that her labia are very inflamed and some burning on outside with urination. No frequency, no hematuria.  No recent antibiotics.   Past Medical History:  Diagnosis Date  . Acid reflux    occasional  . Allergic rhinitis, seasonal    vasomotor rhinitis  . Anemia in the past   is a blood donor  . Constipation, chronic   . Dysmenorrhea   . Heart murmur    Negative echo in the past  . History of chicken pox   . IBS (irritable bowel syndrome)   . Migraines   . OSA (obstructive sleep apnea)    PSG 08/20/10 RDI 18.7  . 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 PTSD (post-traumatic stress disorder)    with depressive symptoms  . Urticaria    stress induced   Past Surgical History:  Procedure Laterality Date  . WISDOM TOOTH EXTRACTION     Family History  Problem Relation Age of Onset  . Hypertension Father   . Allergies Father   . Other Mother        Digestive problems-w/u @ [facility]-? bact inf  . Diabetes Unknown        GM  . Prostate cancer Unknown        GF  . Anxiety disorder Unknown        diffusely in family  . Depression Unknown        diffusely in family  . Colon cancer Neg Hx   . Breast cancer Neg Hx    Social History   Tobacco Use  . Smoking status: Never Smoker  . Smokeless tobacco: Never Used  Substance Use Topics  . Alcohol use: Yes    Alcohol/week: 0.0 oz    Comment: Rare  . Drug use: No      Review of Systems Per HPI    Objective:   Physical Exam  Constitutional: She is oriented to person, place, and time. She appears well-developed and well-nourished.  HENT:  Head: Normocephalic and atraumatic.  Eyes:  Conjunctivae are normal.  Cardiovascular: Normal rate.  Pulmonary/Chest: Effort normal.  Genitourinary: Pelvic exam was performed with patient supine.  Genitourinary Comments: Small amount white discharge. Labia inflamed, no lesions.   Neurological: She is alert and oriented to person, place, and time.  Vitals reviewed.     BP 100/62 (BP Location: Right Arm, Patient Position: Sitting, Cuff Size: Normal)   Pulse 67   Temp 97.9 F (36.6 C) (Oral)   Ht 5' 2.25" (1.581 m)   Wt 169 lb (76.7 kg)   SpO2 98%   BMI 30.66 kg/m      Assessment & Plan:  1. Vaginal candidiasis - fluconazole (DIFLUCAN) 150 MG tablet; Take 1 tablet (150 mg total) by mouth once for 1 dose. Repeat if needed in 48 to 72 hours.  Dispense: 2 tablet; Refill: 0 - RTC precautions reviewed  2. Vaginal discharge - WET PREP BY MOLECULAR PROBE  3. Itching in the vaginal area - WET PREP BY MOLECULAR PROBE   Clarene Reamer, FNP-BC  [facility], [place]  02/28/2018 10:25 AM

## 2018-02-26 LAB — WET PREP BY MOLECULAR PROBE
CANDIDA SPECIES: NOT DETECTED
GARDNERELLA VAGINALIS: NOT DETECTED
MICRO NUMBER:: 90633475
SPECIMEN QUALITY: ADEQUATE
Trichomonas vaginosis: NOT DETECTED

## 2018-02-28 ENCOUNTER — Encounter: Payer: Self-pay | Admitting: Family Medicine

## 2018-04-11 ENCOUNTER — Ambulatory Visit: Payer: BC Managed Care – PPO | Admitting: Internal Medicine

## 2018-04-11 ENCOUNTER — Encounter: Payer: Self-pay | Admitting: Internal Medicine

## 2018-04-11 VITALS — BP 104/70 | HR 76 | Temp 98.3°F | Wt 165.0 lb

## 2018-04-11 DIAGNOSIS — N898 Other specified noninflammatory disorders of vagina: Secondary | ICD-10-CM | POA: Diagnosis not present

## 2018-04-11 DIAGNOSIS — R300 Dysuria: Secondary | ICD-10-CM

## 2018-04-11 LAB — POC URINALSYSI DIPSTICK (AUTOMATED)
Bilirubin, UA: NEGATIVE
GLUCOSE UA: NEGATIVE
Ketones, UA: NEGATIVE
Leukocytes, UA: NEGATIVE
Nitrite, UA: NEGATIVE
PROTEIN UA: NEGATIVE
SPEC GRAV UA: 1.015 (ref 1.010–1.025)
UROBILINOGEN UA: 0.2 U/dL
pH, UA: 7 (ref 5.0–8.0)

## 2018-04-11 MED ORDER — TERCONAZOLE 0.8 % VA CREA: 1.0000 | TOPICAL_CREAM | Freq: Every day | VAGINAL | 0 refills | Status: DC

## 2018-04-11 NOTE — Patient Instructions (Signed)

## 2018-04-11 NOTE — Progress Notes (Signed)
HPI  Pt presents to the clinic today with c/o dysuria, vaginal burning and itching. She reports this has been going on 4-6 weeks. She was seen 02/25/18 for the same, diagnosed with a yeast infection and treated with Diflucan. Wet prep was negative. She denies abdominal pain, irregular menses, urinary urgency, frequency, vaginal discharge or odor. She has tried Monistat, Vagisil, [employer] and Hydrocortisone cream with minimal relief.   Review of Systems  Past Medical History:  Diagnosis Date  . Acid reflux    occasional  . Allergic rhinitis, seasonal    vasomotor rhinitis  . Anemia in the past   is a blood donor  . Constipation, chronic   . Dysmenorrhea   . Heart murmur    Negative echo in the past  . History of chicken pox   . IBS (irritable bowel syndrome)   . Migraines   . OSA (obstructive sleep apnea)    PSG 08/20/10 RDI 18.7  . 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 PTSD (post-traumatic stress disorder)    with depressive symptoms  . Urticaria    stress induced    Family History  Problem Relation Age of Onset  . Hypertension Father   . Allergies Father   . Other Mother        Digestive problems-w/u @ [place] [facility]-? bact inf  . Diabetes Unknown        GM  . Prostate cancer Unknown        GF  . Anxiety disorder Unknown        diffusely in family  . Depression Unknown        diffusely in family  . Colon cancer Neg Hx   . Breast cancer Neg Hx     Social History   Socioeconomic History  . Marital status: Single    Spouse name: Not on file  . Number of children: Not on file  . Years of education: Not on file  . Highest education level: Not on file  Occupational History  . Occupation: Animal nutritionist  Social Needs  . Financial resource strain: Not on file  . Food insecurity:    Worry: Not on file    Inability: Not on file  . Transportation needs:    Medical: Not on file    Non-medical: Not on file   Tobacco Use  . Smoking status: Never Smoker  . Smokeless tobacco: Never Used  Substance and Sexual Activity  . Alcohol use: Yes    Alcohol/week: 0.0 oz    Comment: Rare  . Drug use: No  . Sexual activity: Not on file  Lifestyle  . Physical activity:    Days per week: Not on file    Minutes per session: Not on file  . Stress: Not on file  Relationships  . Social connections:    Talks on phone: Not on file    Gets together: Not on file    Attends religious service: Not on file    Active member of club or organization: Not on file    Attends meetings of clubs or organizations: Not on file    Relationship status: Not on file  . Intimate partner violence:    Fear of current or ex partner: Not on file    Emotionally abused: Not on file    Physically abused: Not on file    Forced sexual activity: Not on file  Other Topics Concern  .  Not on file  Social History Narrative   Grad school at [employer]      Now a counselor in a middle school      Has cats and involved in cat rescue      [id number]      Lives alone      Has run triathlon in the past      Vegetarian    No Known Allergies   Constitutional: Denies fever, malaise, fatigue, headache or abrupt weight changes.   GU: Pt reports vaginal itching, irritation and pain with urination. Denies burning sensation, blood in urine, odor or discharge. Skin: Denies redness, rashes, lesions or ulcercations.   No other specific complaints in a complete review of systems (except as listed in HPI above).    Objective:   Physical Exam  BP 104/70   Pulse 76   Temp 98.3 F (36.8 C) (Oral)   Wt 165 lb (74.8 kg)   SpO2 98%   BMI 29.94 kg/m  Wt Readings from Last 3 Encounters:  04/11/18 165 lb (74.8 kg)  02/25/18 169 lb (76.7 kg)  07/14/17 166 lb 4 oz (75.4 kg)    General: Appears her stated age, obese in NAD. Abdomen: Soft and nontender. Normal bowel sounds. No distention or masses noted. No CVA  tenderness.        Assessment & Plan:   Vaginal Itching, Vaginal Irritation, Dysuria:  Urinalysis: 1+ blood Will send urine culture Wet prep: + yeast, no clue cells, no trich eRx for Terconazole cream- use as directed Drink plenty of fluids  RTC as needed or if symptoms persist. Webb Silversmith, NP

## 2018-04-11 NOTE — Addendum Note (Signed)
Addended by: Lurlean Nanny on: 04/11/2018 04:45 PM   Modules accepted: Orders

## 2018-04-12 LAB — URINE CULTURE
MICRO NUMBER: 90805083
RESULT: NO GROWTH
SPECIMEN QUALITY:: ADEQUATE

## 2018-04-20 ENCOUNTER — Encounter: Payer: Self-pay | Admitting: Family Medicine

## 2018-04-20 ENCOUNTER — Ambulatory Visit: Payer: BC Managed Care – PPO | Admitting: Family Medicine

## 2018-04-20 VITALS — BP 104/62 | HR 78 | Temp 98.7°F | Ht 62.25 in | Wt 165.8 lb

## 2018-04-20 DIAGNOSIS — T753XXA Motion sickness, initial encounter: Secondary | ICD-10-CM | POA: Diagnosis not present

## 2018-04-20 DIAGNOSIS — N760 Acute vaginitis: Secondary | ICD-10-CM | POA: Diagnosis not present

## 2018-04-20 DIAGNOSIS — H6983 Other specified disorders of Eustachian tube, bilateral: Secondary | ICD-10-CM

## 2018-04-20 DIAGNOSIS — H6993 Unspecified Eustachian tube disorder, bilateral: Secondary | ICD-10-CM

## 2018-04-20 LAB — POCT WET PREP (WET MOUNT): Trichomonas Wet Prep HPF POC: ABSENT

## 2018-04-20 MED ORDER — FLUCONAZOLE 150 MG PO TABS: ORAL_TABLET | ORAL | 1 refills | Status: DC

## 2018-04-20 MED ORDER — SCOPOLAMINE 1 MG/3DAYS TD PT72: 1.0000 | MEDICATED_PATCH | TRANSDERMAL | 1 refills | Status: DC

## 2018-04-20 MED ORDER — TERCONAZOLE 0.8 % VA CREA: 1.0000 | TOPICAL_CREAM | Freq: Every day | VAGINAL | 0 refills | Status: DC

## 2018-04-20 MED ORDER — FLUTICASONE PROPIONATE 50 MCG/ACT NA SUSP: 2.0000 | Freq: Every day | NASAL | 11 refills | Status: DC

## 2018-04-20 NOTE — Assessment & Plan Note (Signed)
Prepping for trip to [place]  Boat/plane Scop patch px Disc poss side eff

## 2018-04-20 NOTE — Assessment & Plan Note (Signed)
Appears to be ? Recurrent yeast infection in non sexually active female Disc staying dry/taking probiotic Trial of diflucan Q 3 d for 5 doses  If not imp may need to do once weekly proph or gyn visit  Update if not starting to improve in a week or if worsening
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
## 2018-04-20 NOTE — Patient Instructions (Addendum)
Stay dry the best you can   Use cream as needed Take diflucan 150 mg every 3 days for 5 doses  Alert me it this does not take care of problem Continue probiotics   Try the scop patch for motion sickness for your trip

## 2018-05-25 ENCOUNTER — Other Ambulatory Visit: Payer: Self-pay | Admitting: Family Medicine

## 2018-05-25 MED ORDER — TERCONAZOLE 0.8 % VA CREA: 1.0000 | TOPICAL_CREAM | Freq: Every day | VAGINAL | 3 refills | Status: DC

## 2018-05-25 NOTE — Telephone Encounter (Signed)
Pt seen 04/20/18.

## 2018-05-25 NOTE — Telephone Encounter (Signed)
Let's try the cream treatment instead but use it daily for 10 days- a longer course of treatment will help more   She can use it internally with the applicator and also externally on itchy areas  I put several refills on the cream   Px pended to send   Update [place] if this does not take care of it

## 2018-05-25 NOTE — Telephone Encounter (Signed)
Copied from [place] [id number]. Topic: Quick Communication - See Telephone Encounter >> May 25, 2018  3:38 PM [name] wrote: CRM for notification. See Telephone encounter for: 05/25/18. Patient wanted the provider to know that the pill did not entirely take care of her yeast infection, and the second time around for the pill it gave her headaches, fatigue and nausea.  The cream did the best, but the infection still didn't go away.  Patient is having one to two discharges a day that is beige in color.  Please advise.

## 2018-05-25 NOTE — Telephone Encounter (Signed)
Rx sent to pharmacy and pt advised of Dr. Marliss Coots instructions and verbalized understanding

## 2018-06-15 ENCOUNTER — Ambulatory Visit: Payer: Self-pay | Admitting: *Deleted

## 2018-06-15 ENCOUNTER — Ambulatory Visit: Payer: BC Managed Care – PPO | Admitting: Family Medicine

## 2018-06-15 ENCOUNTER — Encounter: Payer: Self-pay | Admitting: Family Medicine

## 2018-06-15 VITALS — BP 122/74 | HR 71 | Temp 98.1°F | Ht 65.0 in | Wt 164.0 lb

## 2018-06-15 DIAGNOSIS — R197 Diarrhea, unspecified: Secondary | ICD-10-CM | POA: Insufficient documentation

## 2018-06-15 MED ORDER — HYOSCYAMINE SULFATE 0.125 MG PO TABS: ORAL_TABLET | ORAL | 5 refills | Status: DC

## 2018-06-15 NOTE — Assessment & Plan Note (Signed)
In pt with IBS - much longer episode than usual  Began after a trip to [place] -but pt does not think it is travel related  Nl colonoscopy in the past  No more stress than usual- but she tends to have chronic stress  Reassuring exam Disc imp of hydration  Lab today - cmet/cbc/tsh Also stool cx and c diff test  Pending results

## 2018-06-15 NOTE — Progress Notes (Signed)
Subjective:    Patient ID: Debra Edwards, female    DOB: 07-Dec-1975, 42 y.o.   MRN: [id number]  HPI  Here for diarrhea for 3 weeks   Has IBS-intermittent constipation an diarrhea   Usually and episode will not last this long   Takes hyoscamine for cramping -lately has to take more often  Cramping often continues even after BM   Every time she eats she is in the bathroom repeatedly for an hour or more  Not a lot of control of it   Mornings are bad  She has not been woken up by need to have BM or abd pain   Stool is watery / at times is soft (very unformed)  No blood  No black stool   No n/v  No upper abd pain  occ heartburn - zantac helps  No nsaids   Does not know what she ate to start it  Has cut way back on dairy and eggs- feels better and less gas  Eating healthy all summer   She had recently traveled to [place] before this started  Had some diarrhea on the way home , then got constipated and miralax and one fraction of ex lax pill  Diarrhea started after that (never stopped)   Does take probiotics daily  No antibiotics No hospital visits or nursing home   Stress - school started  Job is sometimes a lot   Last colonoscopy for IBS symptoms was 2012 -no concerns   Patient Active Problem List   Diagnosis Date Noted  . Diarrhea 06/15/2018  . Vaginitis 04/20/2018  . Motion sickness 04/20/2018  . Spasm of thoracic back muscle 08/11/2016  . Screening mammogram, encounter for 08/11/2016  . 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 Encounter for routine gynecological examination 11/07/2012  . Routine general medical examination at a health care facility 10/27/2012  . Migraine headache 06/24/2012  . Stress reaction 03/16/2012  . GERD (gastroesophageal reflux disease) 02/19/2012  . IBS (irritable bowel syndrome) 04/14/2011  . OBSTRUCTIVE SLEEP APNEA 09/05/2010  . BRUXISM 07/24/2010  . RESTLESS LEG SYNDROME 07/24/2010  . ALLERGIC RHINITIS 12/17/2009  .  NEOPLASM, SKIN, UNCERTAIN BEHAVIOR 53/66/4403  . PTSD 10/20/2007  . FIBROCYSTIC BREAST DISEASE 10/20/2007  . DYSMENORRHEA 10/20/2007   Past Medical History:  Diagnosis Date  . Acid reflux    occasional  . Allergic rhinitis, seasonal    vasomotor rhinitis  . Anemia in the past   is a blood donor  . Constipation, chronic   . Dysmenorrhea   . Heart murmur    Negative echo in the past  . History of chicken pox   . IBS (irritable bowel syndrome)   . Migraines   . OSA (obstructive sleep apnea)    PSG 08/20/10 RDI 18.7  . 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 PTSD (post-traumatic stress disorder)    with depressive symptoms  . Urticaria    stress induced   Past Surgical History:  Procedure Laterality Date  . WISDOM TOOTH EXTRACTION     Social History   Tobacco Use  . Smoking status: Never Smoker  . Smokeless tobacco: Never Used  Substance Use Topics  . Alcohol use: Yes    Alcohol/week: 0.0 standard drinks    Comment: Rare  . Drug use: No   Family History  Problem Relation Age of Onset  . Hypertension Father   . Allergies Father   .  Other Mother        Digestive problems-w/u @ [place] [facility]-? bact inf  . Diabetes Unknown        GM  . Prostate cancer Unknown        GF  . Anxiety disorder Unknown        diffusely in family  . Depression Unknown        diffusely in family  . Colon cancer Neg Hx   . Breast cancer Neg Hx    No Known Allergies Current Outpatient Medications on File Prior to Visit  Medication Sig Dispense Refill  . acetaminophen (TYLENOL) 500 MG tablet Take 500 mg by mouth every 6 (six) hours as needed. For headache    . buPROPion (WELLBUTRIN XL) 150 MG 24 hr tablet Take 150 mg by mouth every morning.  0  . clonazePAM (KLONOPIN) 0.5 MG tablet Take 0.5 mg by mouth at bedtime.     . cyclobenzaprine (FLEXERIL) 10 MG tablet Take 1 tablet (10 mg total) by mouth 3 (three) times daily as needed. 30  tablet 1  . escitalopram (LEXAPRO) 20 MG tablet Take by mouth.    . Eszopiclone 3 MG TABS Take 3 mg by mouth once as needed.    . fluticasone (FLONASE) 50 MCG/ACT nasal spray Place 2 sprays into both nostrils daily. 16 g 11  . Ibuprofen 200 MG CAPS Take 400-600 mg by mouth as needed.    Marland Kitchen levonorgestrel-ethinyl estradiol (ORSYTHIA) 0.1-20 MG-MCG tablet Take 1 tablet by mouth daily. Take continuously without stopping for the placebo week 5 Package 3  . meclizine (ANTIVERT) 12.5 MG tablet Take 1 tablet (12.5 mg total) by mouth 3 (three) times daily as needed for dizziness. 30 tablet 0  . promethazine (PHENERGAN) 25 MG tablet Take 1 tablet (25 mg total) by mouth every 8 (eight) hours as needed for nausea or vomiting. Caution of sedation 30 tablet 0  . ranitidine (ZANTAC) 150 MG tablet Take 150 mg by mouth 2 (two) times daily.    Marland Kitchen scopolamine (TRANSDERM-SCOP) 1 MG/3DAYS Place 1 patch (1.5 mg total) onto the skin every 3 (three) days. For motion sickness 7 patch 1  . terconazole (TERAZOL 3) 0.8 % vaginal cream Place 1 applicator vaginally at bedtime. To affected area 20 g 3  . valACYclovir (VALTREX) 1000 MG tablet TAKE 2 TABLETS TWICE DAILY FOR 1 DAY AT FIRST SIGN OF A COLD SORE 12 tablet 2   No current facility-administered medications on file prior to visit.     Review of Systems  Constitutional: Positive for fatigue. Negative for activity change, appetite change, fever and unexpected weight change.  HENT: Negative for congestion, ear pain, rhinorrhea, sinus pressure and sore throat.   Eyes: Negative for pain, redness and visual disturbance.  Respiratory: Negative for cough, shortness of breath and wheezing.   Cardiovascular: Negative for chest pain and palpitations.  Gastrointestinal: Positive for abdominal pain and diarrhea. Negative for abdominal distention, anal bleeding, blood in stool, constipation, nausea, rectal pain and vomiting.       Cramping before and after BMs  No other abd pain    Endocrine: Negative for polydipsia and polyuria.  Genitourinary: Negative for dysuria, frequency and urgency.  Musculoskeletal: Negative for arthralgias, back pain and myalgias.       No cramping  Skin: Negative for pallor and rash.  Allergic/Immunologic: Negative for environmental allergies.  Neurological: Negative for dizziness, syncope, light-headedness and headaches.  Hematological: Negative for adenopathy. Does not bruise/bleed easily.  Psychiatric/Behavioral: Negative  for decreased concentration and dysphoric mood. The patient is not nervous/anxious.        Objective:   Physical Exam  Constitutional: She appears well-developed and well-nourished. No distress.  overwt and well appearing  HENT:  Head: Normocephalic and atraumatic.  Mouth/Throat: Oropharynx is clear and moist.  MMM  Eyes: Pupils are equal, round, and reactive to light. Conjunctivae and EOM are normal. No scleral icterus.  Neck: Normal range of motion. Neck supple.  Cardiovascular: Normal rate, regular rhythm and normal heart sounds.  Pulmonary/Chest: Effort normal and breath sounds normal. No respiratory distress. She has no wheezes. She has no rales.  Abdominal: Soft. Bowel sounds are normal. She exhibits no distension, no pulsatile midline mass and no mass. There is no hepatosplenomegaly. There is tenderness in the right lower quadrant and left lower quadrant. There is no rigidity, no rebound, no guarding, no CVA tenderness, no tenderness at McBurney's point and negative Murphy's sign. No hernia.  Nl bs in 4 Q  Mild bilat LQ tenderness w/o rebound or guarding    Lymphadenopathy:    She has no cervical adenopathy.  Neurological: She is alert. She displays normal reflexes. Coordination normal.  Skin: Skin is warm and dry. Capillary refill takes less than 2 seconds. No erythema. No pallor.  No jaundice  Psychiatric: She has a normal mood and affect.          Assessment & Plan:   Problem List Items  Addressed This Visit      Other   Diarrhea - Primary    In pt with IBS - much longer episode than usual  Began after a trip to [place] -but pt does not think it is travel related  Nl colonoscopy in the past  No more stress than usual- but she tends to have chronic stress  Reassuring exam Disc imp of hydration  Lab today - cmet/cbc/tsh Also stool cx and c diff test  Pending results         Relevant Orders   CBC with Differential/Platelet   Comprehensive metabolic panel   TSH   Stool culture   C. difficile GDH and Toxin A/B

## 2018-06-15 NOTE — Telephone Encounter (Signed)
I will see her then  

## 2018-06-15 NOTE — Patient Instructions (Signed)
Keep up with fluid intake Eat bland if that helps  Continue to avoid dairy   Lab today  Stool tests for c diff and culture   Will advise you when results return

## 2018-06-15 NOTE — Telephone Encounter (Signed)
Pt called because she has been having diarrhea which started around 05/25/18; she says that she has IBS and usually leans toward constipation; she has been having 7-8 episodes daily; the pt also says that she has been taking hyoscyamine for cramping; recommendations made per nurse triage protocol to include seeing a physician within 24 hours; pt offered and accepted appointment with Dr Glori Bickers, [facility], today at 1600; she verbalizes understanding; will route to office for notification of this upcoming appointment.   Reason for Disposition . [1] MODERATE diarrhea (e.g., 4-6 times / day more than normal) AND [2] present > 48 hours (2 days)  Answer Assessment - Initial Assessment Questions 1. DIARRHEA SEVERITY: "How bad is the diarrhea?" "How many extra stools have you had in the past 24 hours than normal?"    - NO DIARRHEA (SCALE 0)   - MILD (SCALE 1-3): Few loose or mushy BMs; increase of 1-3 stools over normal daily number of stools; mild increase in ostomy output.   -  MODERATE (SCALE 4-7): Increase of 4-6 stools daily over normal; moderate increase in ostomy output. * SEVERE (SCALE 8-10; OR 'WORST POSSIBLE'): Increase of 7 or more stools daily over normal; moderate increase in ostomy output; incontinence.     6-7; usually constipated 2. ONSET: "When did the diarrhea begin?"      05/25/18 3. BM CONSISTENCY: "How loose or watery is the diarrhea?"      Fluctuates between diarrhea and watery 4. VOMITING: "Are you also vomiting?" If so, ask: "How many times in the past 24 hours?"      no 5. ABDOMINAL PAIN: "Are you having any abdominal pain?" If yes: "What does it feel like?" (e.g., crampy, dull, intermittent, constant)      Crampy, intermittent; taking hyos 6. ABDOMINAL PAIN SEVERITY: If present, ask: "How bad is the pain?"  (e.g., Scale 1-10; mild, moderate, or severe)   - MILD (1-3): doesn't interfere with normal activities, abdomen soft and not tender to touch    - MODERATE (4-7):  interferes with normal activities or awakens from sleep, tender to touch    - SEVERE (8-10): excruciating pain, doubled over, unable to do any normal activities       Lower cramping, relieved by going to the bathroom, moderate to severe; takes hyoscyamine 7. ORAL INTAKE: If vomiting, "Have you been able to drink liquids?" "How much fluids have you had in the past 24 hours?"     n/a 8. HYDRATION: "Any signs of dehydration?" (e.g., dry mouth [not just dry lips], too weak to stand, dizziness, new weight loss) "When did you last urinate?"     no 9. EXPOSURE: "Have you traveled to a foreign country recently?" "Have you been exposed to anyone with diarrhea?" "Could you have eaten any food that was spoiled?"     [place] in August 2019 10. ANTIBIOTIC USE: "Are you taking antibiotics now or have you taken antibiotics in the past 2 months?"       no 11. OTHER SYMPTOMS: "Do you have any other symptoms?" (e.g., fever, blood in stool)       no 12. PREGNANCY: "Is there any chance you are pregnant?" "When was your last menstrual period?"       No continuous birth control pills  Protocols used: DIARRHEA-A-AH

## 2018-06-16 LAB — CBC WITH DIFFERENTIAL/PLATELET
Basophils Absolute: 0.1 10*3/uL (ref 0.0–0.1)
Basophils Relative: 1.6 % (ref 0.0–3.0)
EOS PCT: 1.2 % (ref 0.0–5.0)
Eosinophils Absolute: 0.1 10*3/uL (ref 0.0–0.7)
HCT: 40.3 % (ref 36.0–46.0)
Hemoglobin: 13.9 g/dL (ref 12.0–15.0)
LYMPHS ABS: 2.2 10*3/uL (ref 0.7–4.0)
Lymphocytes Relative: 37.8 % (ref 12.0–46.0)
MCHC: 34.5 g/dL (ref 30.0–36.0)
MCV: 88.4 fl (ref 78.0–100.0)
Monocytes Absolute: 0.6 10*3/uL (ref 0.1–1.0)
Monocytes Relative: 9.7 % (ref 3.0–12.0)
NEUTROS ABS: 2.9 10*3/uL (ref 1.4–7.7)
NEUTROS PCT: 49.7 % (ref 43.0–77.0)
PLATELETS: 219 10*3/uL (ref 150.0–400.0)
RBC: 4.56 Mil/uL (ref 3.87–5.11)
RDW: 12.2 % (ref 11.5–15.5)
WBC: 5.8 10*3/uL (ref 4.0–10.5)

## 2018-06-16 LAB — COMPREHENSIVE METABOLIC PANEL
ALT: 19 U/L (ref 0–35)
AST: 18 U/L (ref 0–37)
Albumin: 4.3 g/dL (ref 3.5–5.2)
Alkaline Phosphatase: 45 U/L (ref 39–117)
BUN: 10 mg/dL (ref 6–23)
CO2: 25 meq/L (ref 19–32)
Calcium: 9 mg/dL (ref 8.4–10.5)
Chloride: 103 mEq/L (ref 96–112)
Creatinine, Ser: 0.98 mg/dL (ref 0.40–1.20)
GFR: 65.9 mL/min (ref >60.00)
Glucose, Bld: 88 mg/dL (ref 70–99)
POTASSIUM: 4 meq/L (ref 3.5–5.1)
Sodium: 136 mEq/L (ref 135–145)
Total Bilirubin: 0.4 mg/dL (ref 0.2–1.2)
Total Protein: 7 g/dL (ref 6.0–8.3)

## 2018-06-16 LAB — TSH: TSH: 3.43 u[IU]/mL (ref 0.35–4.50)

## 2018-06-19 ENCOUNTER — Encounter: Payer: Self-pay | Admitting: Family Medicine

## 2018-06-19 LAB — STOOL CULTURE
MICRO NUMBER:: 91088168
MICRO NUMBER:: 91088170
MICRO NUMBER:: 91088171
SHIGA RESULT:: NOT DETECTED
SPECIMEN QUALITY: ADEQUATE
SPECIMEN QUALITY: ADEQUATE
SPECIMEN QUALITY:: ADEQUATE

## 2018-06-19 LAB — C. DIFFICILE GDH AND TOXIN A/B
GDH ANTIGEN: NOT DETECTED
MICRO NUMBER:: 91088169
SPECIMEN QUALITY: ADEQUATE
TOXIN A AND B: NOT DETECTED

## 2018-07-07 ENCOUNTER — Telehealth: Payer: Self-pay | Admitting: Family Medicine

## 2018-07-07 DIAGNOSIS — Z0000 Encounter for general adult medical examination without abnormal findings: Secondary | ICD-10-CM

## 2018-07-07 NOTE — Telephone Encounter (Signed)
-----   Message from Lendon Collar, RT sent at 07/04/2018  9:55 AM EDT ----- Regarding: Lab orders for Friday 07/15/18 Please enter CPE lab orders for 07/15/18. Thanks!

## 2018-07-07 NOTE — Telephone Encounter (Signed)
I just ordered lipids since we recently checked cmp/tsh and cbc for an acute visit

## 2018-07-15 ENCOUNTER — Other Ambulatory Visit (INDEPENDENT_AMBULATORY_CARE_PROVIDER_SITE_OTHER): Payer: BC Managed Care – PPO

## 2018-07-15 DIAGNOSIS — Z0000 Encounter for general adult medical examination without abnormal findings: Secondary | ICD-10-CM

## 2018-07-15 LAB — LIPID PANEL
CHOLESTEROL: 131 mg/dL (ref 0–200)
HDL: 53.7 mg/dL (ref >39.00)
LDL Cholesterol: 62 mg/dL (ref 0–99)
NonHDL: 77.3
TRIGLYCERIDES: 77 mg/dL (ref 0.0–149.0)
Total CHOL/HDL Ratio: 2
VLDL: 15.4 mg/dL (ref 0.0–40.0)

## 2018-07-19 ENCOUNTER — Ambulatory Visit (INDEPENDENT_AMBULATORY_CARE_PROVIDER_SITE_OTHER): Payer: BC Managed Care – PPO | Admitting: Family Medicine

## 2018-07-19 ENCOUNTER — Encounter: Payer: Self-pay | Admitting: Family Medicine

## 2018-07-19 VITALS — BP 108/66 | HR 68 | Temp 99.1°F | Ht 62.25 in | Wt 161.8 lb

## 2018-07-19 DIAGNOSIS — N946 Dysmenorrhea, unspecified: Secondary | ICD-10-CM

## 2018-07-19 DIAGNOSIS — F4310 Post-traumatic stress disorder, unspecified: Secondary | ICD-10-CM | POA: Diagnosis not present

## 2018-07-19 DIAGNOSIS — K591 Functional diarrhea: Secondary | ICD-10-CM | POA: Diagnosis not present

## 2018-07-19 DIAGNOSIS — Z0000 Encounter for general adult medical examination without abnormal findings: Secondary | ICD-10-CM

## 2018-07-19 MED ORDER — LEVONORGESTREL-ETHINYL ESTRAD 0.1-20 MG-MCG PO TABS: 1.0000 | ORAL_TABLET | Freq: Every day | ORAL | 3 refills | Status: DC

## 2018-07-19 NOTE — Assessment & Plan Note (Signed)
Continues care from psychiatry and counselor  Fairly stable Reviewed stressors/ coping techniques/symptoms/ support sources/ tx options and side effects in detail today

## 2018-07-19 NOTE — Patient Instructions (Addendum)
Go ahead and do a withdrawal week and get back on track   We will refer you to GI for your diarrhea and cramps  Use hyoscamine if it helps  Stay hydrated   Take care of yourself when you can

## 2018-07-19 NOTE — Assessment & Plan Note (Signed)
Ongoing intermittent diarrhea with severe abd cramping Rev labs and last stool studies Hx of IBS in the past This is waking her up at night  See need for further eval  Ref to GI
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
## 2018-07-19 NOTE — Assessment & Plan Note (Signed)
Reviewed health habits including diet and exercise and skin cancer prevention Reviewed appropriate screening tests for age  Also reviewed health mt list, fam hx and immunization status , as well as social and family history   See HPI Labs reviewed  Disc gyn hx  Continue OC  Ref to GI for worsening diarrhea/cramps  Disc imp of self care with healthy diet and exercise when able

## 2018-07-21 ENCOUNTER — Ambulatory Visit: Payer: BC Managed Care – PPO | Admitting: Gastroenterology

## 2018-07-21 ENCOUNTER — Encounter: Payer: Self-pay | Admitting: Gastroenterology

## 2018-07-21 VITALS — BP 100/60 | HR 76 | Ht 62.5 in | Wt 163.0 lb

## 2018-07-21 DIAGNOSIS — R1084 Generalized abdominal pain: Secondary | ICD-10-CM

## 2018-07-21 DIAGNOSIS — R197 Diarrhea, unspecified: Secondary | ICD-10-CM | POA: Diagnosis not present

## 2018-07-21 MED ORDER — DICYCLOMINE HCL 20 MG PO TABS: 20.0000 mg | ORAL_TABLET | Freq: Two times a day (BID) | ORAL | 3 refills | Status: DC

## 2018-07-21 NOTE — Patient Instructions (Addendum)
We have sent Bentyl to your pharmacy  Use Florastor twice daily or VSL probiotics  Use Imodium as needed   Lactose-Free Diet, Adult If you have lactose intolerance, you are not able to digest lactose. Lactose is a natural sugar found mainly in milk and milk products. You may need to avoid all foods and beverages that contain lactose. A lactose-free diet can help you do this. What do I need to know about this diet?  Do not consume foods, beverages, vitamins, minerals, or medicines with lactose. Read ingredients lists carefully.  Look for the words "lactose-free" on labels.  Use lactase enzyme drops or tablets as directed by your health care provider.  Use lactose-free milk or a milk alternative, such as soy milk, for drinking and cooking.  Make sure you get enough calcium and vitamin D in your diet. A lactose-free eating plan can be lacking in these important nutrients.  Take calcium and vitamin D supplements as directed by your health care provider. Talk to your provider about supplements if you are not able to get enough calcium and vitamin D from food. Which foods have lactose? Lactose is found in:  Milk and foods made from milk.  Yogurt.  Cheese.  Butter.  Margarine.  Sour cream.  Cream.  Whipped toppings and nondairy creamers.  Ice cream and other milk-based desserts.  Lactose is also found in foods or products made with milk or milk ingredients. To find out whether a food contains milk or a milk ingredient, look at the ingredients list. Avoid foods with the statement "May contain milk" and foods that contain:  Butter.  Cream.  Milk.  Milk solids.  Milk powder.  Whey.  Curd.  Caseinate.  Lactose.  Lactalbumin.  Lactoglobulin.  What are some alternatives to milk and foods made with milk products?  Lactose-free milk.  Soy milk with added calcium and vitamin D.  Almond, coconut, or rice milk with added calcium and vitamin D. Note that these are  low in protein.  Soy products, such as soy yogurt, soy cheese, soy ice cream, and soy-based sour cream. Which foods can I eat? Grains Breads and rolls made without milk, such as [place], [place], or [place] bread, bagels, pita, and [employer]. Corn tortillas, corn meal, grits, and polenta. Crackers without lactose or milk solids, such as soda crackers and graham crackers. Cooked or dry cereals without lactose or milk solids. Pasta, quinoa, couscous, barley, oats, bulgur, farro, rice, wild rice, or other grains prepared without milk or lactose. Plain popcorn. Vegetables Fresh, frozen, and canned vegetables without cheese, cream, or butter sauces. Fruits All fresh, canned, frozen, or dried fruits that are not processed with lactose. Meats and Other Protein Sources Plain beef, chicken, fish, [place], lamb, veal, pork, wild game, or ham. Kosher-prepared meat products. Strained or junior meats that do not contain milk. Eggs. Soy meat substitutes. Beans, lentils, and hummus. Tofu. Nuts and seeds. Peanut or other nut butters without lactose. Soups, casseroles, and mixed dishes without cheese, cream, or milk. Dairy Lactose-free milk. Soy, rice, or almond milk with added calcium and vitamin D. Soy cheese and yogurt. Beverages Carbonated drinks. Tea. Coffee, freeze-dried coffee, and some instant coffees. Fruit and vegetable juices. Condiments Soy sauce. Carob powder. Olives. Gravy made with water. Baker's cocoa. Angie Fava. Pure seasonings and spices. Ketchup. Mustard. Bouillon. Broth. Sweets and Desserts Water and fruit ices. Gelatin. Cookies, pies, or cakes made from allowed ingredients, such as angel food cake. Pudding made with water or a milk substitute. Lactose-free  tofu desserts. Soy, coconut milk, or rice-milk-based frozen desserts. Sugar. Honey. Jam, jelly, and marmalade. Molasses. Pure sugar candy. Dark chocolate without milk. Marshmallows. Fats and Oils Margarines and salad dressings that do not contain  milk. Berniece Salines. Vegetable oils. Shortening. Mayonnaise. Soy or coconut-based cream. The items listed above may not be a complete list of recommended foods or beverages. Contact your dietitian for more options. Which foods are not recommended? Grains Breads and rolls that contain milk. Toaster pastries. Muffins, biscuits, waffles, cornbread, and pancakes. These can be prepared at home, commercial, or from mixes. Sweet rolls, donuts, English muffins, fry bread, lefse, flour tortillas with lactose, or [place] toast made with milk or milk ingredients. Crackers that contain lactose. Corn curls. Cooked or dry cereals with lactose. Vegetables Creamed or breaded vegetables. Vegetables in a cheese or butter sauce or with lactose-containing margarines. Instant potatoes. [place] fries. Scalloped or au gratin potatoes. Fruits None. Meats and Other Protein Sources Scrambled eggs, omelets, and souffles that contain milk. Creamed or breaded meat, fish, chicken, or [place]. Sausage products, such as wieners and liver sausage. Cold cuts that contain milk solids. Cheese, cottage cheese, ricotta cheese, and cheese spreads. Lasagna and macaroni and cheese. Pizza. Peanut or other nut butters with added milk solids. Casseroles or mixed dishes containing milk or cheese. Dairy All dairy products, including milk, goat's milk, buttermilk, kefir, acidophilus milk, flavored milk, evaporated milk, condensed milk, dulce de [place], eggnog, yogurt, cheese, and cheese spreads. Beverages Hot chocolate. Cocoa with lactose. Instant iced teas. Powdered fruit drinks. Smoothies made with milk or yogurt. Condiments Chewing gum that has lactose. Cocoa that has lactose. Spice blends if they contain milk products. Artificial sweeteners that contain lactose. Nondairy creamers. Sweets and Desserts Ice cream, ice milk, gelato, sherbet, and frozen yogurt. Custard, pudding, and mousse. Cake, cream pies, cookies, and other desserts containing milk,  cream, cream cheese, or milk chocolate. Pie crust made with milk-containing margarine or butter. Reduced-calorie desserts made with a sugar substitute that contains lactose. Toffee and butterscotch. Milk, white, or dark chocolate that contains milk. Fudge. Caramel. Fats and Oils Margarines and salad dressings that contain milk or cheese. Cream. Half and half. Cream cheese. Sour cream. Chip dips made with sour cream or yogurt. The items listed above may not be a complete list of foods and beverages to avoid. Contact your dietitian for more information. Am I getting enough calcium? Calcium is found in many foods that contain lactose and is important for bone health. The amount of calcium you need depends on your age:  Adults younger than 50 years: 1000 mg of calcium a day.  Adults older than 50 years: 1200 mg of calcium a day.  If you are not getting enough calcium, other calcium sources include:  Orange juice with calcium added. There are 300-350 mg of calcium in 1 cup of orange juice.  Sardines with edible bones. There are 325 mg of calcium in 3 oz of sardines.  Calcium-fortified soy milk. There are 300-400 mg of calcium in 1 cup of calcium-fortified soy milk.  Calcium-fortified rice or almond milk. There are 300 mg of calcium in 1 cup of calcium-fortified rice or almond milk.  Canned salmon with edible bones. There are 180 mg of calcium in 3 oz of canned salmon with edible bones.  Calcium-fortified breakfast cereals. There are [phone] mg of calcium in calcium-fortified breakfast cereals.  Tofu set with calcium sulfate. There are 250 mg of calcium in  cup of tofu set with calcium sulfate.  Spinach, cooked. There are 145 mg of calcium in  cup of cooked spinach.  Edamame, cooked. There are 130 mg of calcium in  cup of cooked edamame.  Collard greens, cooked. There are 125 mg of calcium in  cup of cooked collard greens.  Kale, frozen or cooked. There are 90 mg of calcium in  cup  of cooked or frozen kale.  Almonds. There are 95 mg of calcium in  cup of almonds.  Broccoli, cooked. There are 60 mg of calcium in 1 cup of cooked broccoli.  This information is not intended to replace advice given to you by your health care provider. Make sure you discuss any questions you have with your health care provider. Document Released: 03/13/2002 Document Revised: 02/27/2016 Document Reviewed: 12/22/2013 Elsevier Interactive Patient Education  [employer].  If you are age 34 or older, your body mass index should be between 23-30. Your Body mass index is 29.34 kg/m. If this is out of the aforementioned range listed, please consider follow up with your Primary Care Provider.  If you are age 21 or younger, your body mass index should be between 19-25. Your Body mass index is 29.34 kg/m. If this is out of the aformentioned range listed, please consider follow up with your Primary Care Provider.

## 2018-07-21 NOTE — Progress Notes (Signed)
07/21/2018 Debra Edwards [id number] 1975-11-13   HISTORY OF PRESENT ILLNESS:  This is a 42 year old female with IBS-C for years who is a patient of Dr. Ardis Hughs.    Now for the past 2 months has been having diarrhea and abdominal pain.  She says that she was managing her constipation with MiraLAX 2 or 3 times daily with an occasional Ex-Lax.  Now for the past 2 months she has been having anywhere from 2 to 5 or 6 loose or watery bowel movements daily.  She has a lot of urgency.  She denies seeing blood in her stool.  She also complains of a lot of cramping abdominal pain.  She says that she has been following a very bland diet and if she deviates from that she gets severe abdominal pain.  She is already vegetarian and converting to vegan and has been trying to do completely lactose-free with that.  She says that she tried Pepto-Bismol but it really irritated her stomach.  Has been using Levsin and it does help some for a short time.  Stool studies negative.  Labs unremarkable.  Had another similar episode in 2012 that prompted colonoscopy.  Symptoms resolved much quicker on that occasion and colonoscopy was unremarkable.  Could be that her IBS symptoms have shifted and she is now experiencing diarrhea with that.   Colonoscopy 06/2011 was completely normal.   Referred here on this occasion from her PCP, Dr. Glori Bickers.   Past Medical History:  Diagnosis Date  . Acid reflux    occasional  . Allergic rhinitis, seasonal    vasomotor rhinitis  . Anemia in the past   is a blood donor  . Anxiety   . Constipation, chronic   . Depression   . Dysmenorrhea   . GERD (gastroesophageal reflux disease)   . Heart murmur    Negative echo in the past  . History of chicken pox   . IBS (irritable bowel syndrome)   . Migraines   . OSA (obstructive sleep apnea)    PSG 08/20/10 RDI 18.7  . 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 PTSD  (post-traumatic stress disorder)    with depressive symptoms  . Urticaria    stress induced   Past Surgical History:  Procedure Laterality Date  . WISDOM TOOTH EXTRACTION      reports that she has never smoked. She has never used smokeless tobacco. She reports that she drinks alcohol. She reports that she does not use drugs. family history includes Allergies in her father; Anxiety disorder in her unknown relative; Cancer in her paternal grandfather; Depression in her unknown relative; Diabetes in her maternal grandfather; Heart disease in her father; Hypertension in her father; Irritable bowel syndrome in her mother; Other in her mother. No Known Allergies    Outpatient Encounter Medications as of 07/21/2018  Medication Sig  . acetaminophen (TYLENOL) 500 MG tablet Take 500 mg by mouth every 6 (six) hours as needed. For headache  . buPROPion (WELLBUTRIN XL) 150 MG 24 hr tablet Take 150 mg by mouth every morning.  . clonazePAM (KLONOPIN) 0.5 MG tablet Take 0.5 mg by mouth at bedtime.   . cyclobenzaprine (FLEXERIL) 10 MG tablet Take 1 tablet (10 mg total) by mouth 3 (three) times daily as needed.  Marland Kitchen escitalopram (LEXAPRO) 20 MG tablet Take by mouth.  . Eszopiclone 3 MG TABS Take 3 mg by mouth once  as needed.  . fluticasone (FLONASE) 50 MCG/ACT nasal spray Place 2 sprays into both nostrils daily.  . hyoscyamine (LEVSIN, ANASPAZ) 0.125 MG tablet TAKE 1 TABLET BY MOUTH EVERY 6 HOURS AS NEEDED FOR CRAMPING  . Ibuprofen 200 MG CAPS Take 400-600 mg by mouth as needed.  Marland Kitchen levonorgestrel-ethinyl estradiol (ORSYTHIA) 0.1-20 MG-MCG tablet Take 1 tablet by mouth daily. Take continuously without stopping for the placebo week  . meclizine (ANTIVERT) 12.5 MG tablet Take 1 tablet (12.5 mg total) by mouth 3 (three) times daily as needed for dizziness.  . promethazine (PHENERGAN) 25 MG tablet Take 1 tablet (25 mg total) by mouth every 8 (eight) hours as needed for nausea or vomiting. Caution of sedation  .  ranitidine (ZANTAC) 150 MG tablet Take 150 mg by mouth 2 (two) times daily.  Marland Kitchen scopolamine (TRANSDERM-SCOP) 1 MG/3DAYS Place 1 patch (1.5 mg total) onto the skin every 3 (three) days. For motion sickness  . terconazole (TERAZOL 3) 0.8 % vaginal cream Place 1 applicator vaginally at bedtime. To affected area  . valACYclovir (VALTREX) 1000 MG tablet TAKE 2 TABLETS TWICE DAILY FOR 1 DAY AT FIRST SIGN OF A COLD SORE   No facility-administered encounter medications on file as of 07/21/2018.      REVIEW OF SYSTEMS  : All other systems reviewed and negative except where noted in the History of Present Illness.   PHYSICAL EXAM: BP 100/60   Pulse 76   Ht 5' 2.5" (1.588 m) Comment: without shoes  Wt 163 lb (73.9 kg)   BMI 29.34 kg/m  General: Well developed white female in no acute distress Head: Normocephalic and atraumatic Eyes:  Sclerae anicteric, conjunctiva pink. Ears: Normal auditory acuity Lungs: Clear throughout to auscultation; no increased WOB. Heart: Regular rate and rhythm; no M/R/G. Abdomen: Soft, non-distended.  BS present.  Non-tender. Musculoskeletal: Symmetrical with no gross deformities  Skin: No lesions on visible extremities Extremities: No edema  Neurological: Alert oriented x 4, grossly non-focal Psychological:  Alert and cooperative. Normal mood and affect  ASSESSMENT AND PLAN: *42 year old female with IBS-C for years.  Now for the past 2 months has been having diarrhea and abdominal pain, however.  Stool studies negative.  Labs unremarkable.  Had another similar episode in 2012 that prompted colonoscopy.  Symptoms resolved much quicker on that occasion and colonoscopy was unremarkable.  Could be that her IBS symptoms have shifted and she is now experiencing diarrhea with that.  We discussed repeat colonoscopy to once again rule out IBD and microscopic colitis as well.  She has declined colonoscopy for now due to financial issues/insurance deductible.  She would like to  try some conservative measures first.  We discussed daily probiotic such as Florastor or VSL #3.  She is also going to continue following a lactose-free diet and she was also like to try FODMAP diet.  Can use Imodium as needed.  We will also prescribe Bentyl 20 mg once or twice daily (can still use levsin a couple of times a day as well if needed).  If her symptoms continue towards the end of the year she will come back to rediscuss and schedule colonoscopy for January 2020.   CC:  Tower, Wynelle Fanny, MD

## 2018-07-22 NOTE — Progress Notes (Signed)
I agree with the above note, plan 

## 2018-07-26 ENCOUNTER — Telehealth: Payer: Self-pay | Admitting: Gastroenterology

## 2018-07-26 NOTE — Telephone Encounter (Signed)
The pt states she has had great response to bentyl but has also had some constipation.  She will go back to miralax daily and titrate as needed. She will call back with an update if this does not work.

## 2018-10-03 ENCOUNTER — Other Ambulatory Visit: Payer: Self-pay | Admitting: Family Medicine

## 2018-10-06 MED ORDER — LEVONORGESTREL-ETHINYL ESTRAD 0.1-20 MG-MCG PO TABS: 1.0000 | ORAL_TABLET | Freq: Every day | ORAL | 2 refills | Status: DC

## 2018-10-06 NOTE — Telephone Encounter (Signed)
Pharmacy faxed letter saying they never received BCP Rx, resent

## 2018-10-06 NOTE — Addendum Note (Signed)
Addended by: Tammi Sou on: 10/06/2018 05:01 PM   Modules accepted: Orders

## 2018-10-16 ENCOUNTER — Other Ambulatory Visit: Payer: Self-pay | Admitting: Gastroenterology

## 2018-11-02 ENCOUNTER — Other Ambulatory Visit: Payer: Self-pay | Admitting: Gastroenterology

## 2018-11-02 ENCOUNTER — Encounter: Payer: Self-pay | Admitting: Family Medicine

## 2018-11-02 ENCOUNTER — Telehealth: Payer: Self-pay | Admitting: Family Medicine

## 2018-11-02 ENCOUNTER — Ambulatory Visit: Payer: BC Managed Care – PPO | Admitting: Family Medicine

## 2018-11-02 VITALS — BP 120/68 | HR 77 | Temp 98.6°F | Ht 62.5 in

## 2018-11-02 DIAGNOSIS — J069 Acute upper respiratory infection, unspecified: Secondary | ICD-10-CM | POA: Insufficient documentation

## 2018-11-02 MED ORDER — BENZONATATE 200 MG PO CAPS: 200.0000 mg | ORAL_CAPSULE | Freq: Three times a day (TID) | ORAL | 1 refills | Status: DC | PRN

## 2018-11-02 NOTE — Telephone Encounter (Signed)
[facility] Patient Name: Debra Edwards Gender: Female DOB: 06-02-1976  Age: 43 Y 2 M 4 D Return Phone Number: [phone] (Primary) Address:  City/State/Zip: [place]  [address] Client Export Night - Client Client Site [place] Physician Tower, Roque Lias - MD Contact Type Call Who Is Calling Patient / Member / Family / Caregiver Call Type Triage / Clinical Relationship To Patient Self Return Phone Number [phone] (Primary) Chief Complaint Cough Reason for Call Symptomatic / Request for [place] states she exercised for 30 minutes last night. She started coughing afterwards. She felt like her throat was closing. She drank cold water and got cold air from the freezer. Symptoms have subsided, but she now feels like she has a cold. Translation No Nurse Assessment Nurse: Ronnald Ramp, RN, Miranda Date/Time (Eastern Time): 11/02/2018 8:10:25 AM Confirm and document reason for call. If symptomatic, describe symptoms. ---Caller states she has post nasal drip. She is also having chest pain/tightness. She had an episode of severe cough and tightness in her chest last night. The episode lasted 1-2 hrs and started about 15 min after exercising. Does the patient have any new or worsening symptoms? ---Yes Will a triage be completed? ---Yes Related visit to physician within the last 2 weeks? ---No Does the PT have any chronic conditions? (i.e. diabetes, asthma, this includes High risk factors for pregnancy, etc.) ---Yes List chronic conditions. ---PTSD, Anxiety, Allergies Is the patient pregnant or possibly pregnant? (Ask all females between the ages of 83-55) ---No Is this a behavioral health or substance abuse call? ---No Guidelines Guideline Title Affirmed Question Affirmed Notes Nurse Date/Time (Eastern  Time) Chest Pain Chest pain lasts > 5 minutes (Exceptions: chest pain occurring > 3 days ago and now asymptomatic; same as previously diagnosed heartburn and has  Ronnald Ramp, Therapist, sports, Miranda 11/02/2018 8:17:09 AM PLEASE NOTE:  All timestamps contained within this report are represented as [place] Standard Time. CONFIDENTIALTY NOTICE: This fax transmission is intended only for the addressee.  It contains information that is legally privileged, confidential or otherwise protected from use or disclosure.  If you are not the intended recipient, you are strictly prohibited from reviewing, disclosing, copying using or disseminating any of this information or taking any action in reliance on or regarding this information.  If you have received this fax in error, please notify us immediately by telephone so that we can arrange for its return to [place]. Phone:  [phone], Toll-Free:  [phone], Fax:  [phone] Page: 2 of 2 Call Id: [id number] Guidelines Guideline Title Affirmed Question Affirmed Notes Nurse Date/Time Eilene Ghazi Time) accompanying sour taste in mouth) Disp. Time Eilene Ghazi Time) Disposition Final User 11/02/2018 8:19:23 AM Go to ED Now (or PCP triage) Yes Ronnald Ramp, RN, Miranda     Caller Disagree/Comply Comply Caller Understands Yes PreDisposition Did not know what to do Care Advice Given Per Guideline GO TO ED NOW (OR PCP TRIAGE): * IF NO PCP (PRIMARY CARE PROVIDER) SECOND-LEVEL TRIAGE: You need to be seen within the next hour. Go to the [facility] at _____________ [place] as soon as you can. CALL EMS IF: * Severe difficulty breathing occurs CARE ADVICE given per Chest Pain (Adult) guideline. * You become worse. * Passes out or becomes too weak to stand Referrals REFERRED TO PCP OFFIC

## 2018-11-02 NOTE — Progress Notes (Signed)
Subjective:    Patient ID: Debra Edwards, female    DOB: 1976/06/28, 43 y.o.   MRN: [id number]  HPI  Here for uri symptoms  Got short of breath after exercise yesterday (itching/burning sensation)  Then started to develop cold symptoms today Woke up this am and chest felt full (like a chest cold)  Feels inflamed  No wheezing  Started tickle and cough  Non productive   No fever  Sinus pressure  Stuffy nose  Sore throat   Patient Active Problem List   Diagnosis Date Noted  . URI with cough and congestion 11/02/2018  . Diarrhea 06/15/2018  . Vaginitis 04/20/2018  . Motion sickness 04/20/2018  . Spasm of thoracic back muscle 08/11/2016  . Screening mammogram, encounter for 08/11/2016  . 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 Encounter for routine gynecological examination 11/07/2012  . Routine general medical examination at a health care facility 10/27/2012  . Migraine headache 06/24/2012  . Stress reaction 03/16/2012  . GERD (gastroesophageal reflux disease) 02/19/2012  . Generalized abdominal pain 05/04/2011  . IBS (irritable bowel syndrome) 04/14/2011  . OBSTRUCTIVE SLEEP APNEA 09/05/2010  . BRUXISM 07/24/2010  . RESTLESS LEG SYNDROME 07/24/2010  . ALLERGIC RHINITIS 12/17/2009  . NEOPLASM, SKIN, UNCERTAIN BEHAVIOR 12/75/1700  . PTSD 10/20/2007  . FIBROCYSTIC BREAST DISEASE 10/20/2007  . DYSMENORRHEA 10/20/2007   Past Medical History:  Diagnosis Date  . Acid reflux    occasional  . Allergic rhinitis, seasonal    vasomotor rhinitis  . Anemia in the past   is a blood donor  . Anxiety   . Constipation, chronic   . Depression   . Dysmenorrhea   . GERD (gastroesophageal reflux disease)   . Heart murmur    Negative echo in the past  . History of chicken pox   . IBS (irritable bowel syndrome)   . Migraines   . OSA (obstructive sleep apnea)    PSG 08/20/10 RDI 18.7  . 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 PTSD (post-traumatic stress disorder)    with depressive symptoms  . Urticaria    stress induced   Past Surgical History:  Procedure Laterality Date  . WISDOM TOOTH EXTRACTION     Social History   Tobacco Use  . Smoking status: Never Smoker  . Smokeless tobacco: Never Used  Substance Use Topics  . Alcohol use: Yes    Alcohol/week: 0.0 standard drinks    Comment: Rare  . Drug use: No   Family History  Problem Relation Age of Onset  . Hypertension Father   . Allergies Father   . Heart disease Father   . Other Mother        Digestive problems-w/u @ [facility]-? bact inf  . Irritable bowel syndrome Mother   . Anxiety disorder Unknown        diffusely in family  . Depression Unknown        diffusely in family  . Diabetes Maternal Grandfather   . Cancer Paternal Grandfather        unsure type  . Colon cancer Neg Hx   . Breast cancer Neg Hx    No Known Allergies Current Outpatient Medications on File Prior to Visit  Medication Sig Dispense Refill  . acetaminophen (TYLENOL) 500 MG tablet Take 500 mg by mouth every 6 (six) hours as needed. For headache    . buPROPion (WELLBUTRIN XL) 150 MG  24 hr tablet Take 150 mg by mouth every morning.  0  . clonazePAM (KLONOPIN) 0.5 MG tablet Take 0.5 mg by mouth at bedtime.     . cyclobenzaprine (FLEXERIL) 10 MG tablet Take 1 tablet (10 mg total) by mouth 3 (three) times daily as needed. 30 tablet 1  . escitalopram (LEXAPRO) 20 MG tablet Take by mouth.    . Eszopiclone 3 MG TABS Take 3 mg by mouth once as needed.    . fluticasone (FLONASE) 50 MCG/ACT nasal spray Place 2 sprays into both nostrils daily. 16 g 11  . hyoscyamine (LEVSIN, ANASPAZ) 0.125 MG tablet TAKE 1 TABLET BY MOUTH EVERY 6 HOURS AS NEEDED FOR CRAMPING 30 tablet 5  . Ibuprofen 200 MG CAPS Take 400-600 mg by mouth as needed.    Marland Kitchen levonorgestrel-ethinyl estradiol (ORSYTHIA) 0.1-20 MG-MCG tablet Take 1 tablet by mouth daily. Take continuously without stopping  for the placebo week 5 Package 2  . meclizine (ANTIVERT) 12.5 MG tablet Take 1 tablet (12.5 mg total) by mouth 3 (three) times daily as needed for dizziness. 30 tablet 0  . promethazine (PHENERGAN) 25 MG tablet Take 1 tablet (25 mg total) by mouth every 8 (eight) hours as needed for nausea or vomiting. Caution of sedation 30 tablet 0  . ranitidine (ZANTAC) 150 MG tablet Take 150 mg by mouth 2 (two) times daily.    Marland Kitchen scopolamine (TRANSDERM-SCOP) 1 MG/3DAYS Place 1 patch (1.5 mg total) onto the skin every 3 (three) days. For motion sickness 7 patch 1  . terconazole (TERAZOL 3) 0.8 % vaginal cream Place 1 applicator vaginally at bedtime. To affected area 20 g 3  . valACYclovir (VALTREX) 1000 MG tablet TAKE 2 TABLETS TWICE DAILY FOR 1 DAY AT FIRST SIGN OF A COLD SORE 12 tablet 2   No current facility-administered medications on file prior to visit.     Review of Systems  Constitutional: Positive for appetite change and fatigue. Negative for chills, diaphoresis and fever.  HENT: Positive for congestion, postnasal drip, rhinorrhea, sinus pressure, sneezing and sore throat. Negative for ear pain.   Eyes: Negative for pain and discharge.  Respiratory: Positive for cough and chest tightness. Negative for shortness of breath, wheezing and stridor.   Cardiovascular: Negative for chest pain.  Gastrointestinal: Negative for diarrhea, nausea and vomiting.  Genitourinary: Negative for frequency, hematuria and urgency.  Musculoskeletal: Negative for arthralgias and myalgias.  Skin: Negative for rash.  Neurological: Positive for headaches. Negative for dizziness, weakness and light-headedness.  Psychiatric/Behavioral: Negative for confusion and dysphoric mood.       Objective:   Physical Exam Constitutional:      General: She is not in acute distress.    Appearance: Normal appearance. She is well-developed. She is obese. She is not diaphoretic.     Comments: Fatigued appearing   HENT:     Head:  Normocephalic and atraumatic.     Comments: Nares are injected and congested      Right Ear: Tympanic membrane, ear canal and external ear normal.     Left Ear: Tympanic membrane, ear canal and external ear normal.     Nose: Congestion and rhinorrhea present.     Mouth/Throat:     Mouth: Mucous membranes are moist.     Pharynx: Oropharynx is clear. No oropharyngeal exudate or posterior oropharyngeal erythema.     Comments: Clear pnd  Eyes:     General:        Right eye: No discharge.  Left eye: No discharge.     Conjunctiva/sclera: Conjunctivae normal.     Pupils: Pupils are equal, round, and reactive to light.  Neck:     Musculoskeletal: Normal range of motion and neck supple.  Cardiovascular:     Rate and Rhythm: Normal rate and regular rhythm.     Heart sounds: Normal heart sounds.  Pulmonary:     Effort: Pulmonary effort is normal. No respiratory distress.     Breath sounds: Normal breath sounds. No stridor. No wheezing, rhonchi or rales.     Comments: Harsh bs  Good air exch No rales/rhonchi On wheeze even on forced expiration   Harsh /hacking cough  Chest:     Chest wall: No tenderness.  Musculoskeletal:     Right lower leg: No edema.     Left lower leg: No edema.  Lymphadenopathy:     Cervical: No cervical adenopathy.  Skin:    General: Skin is warm and dry.     Capillary Refill: Capillary refill takes less than 2 seconds.     Findings: No rash.  Neurological:     Mental Status: She is alert.     Cranial Nerves: No cranial nerve deficit.  Psychiatric:        Mood and Affect: Mood normal.     Comments: Pleasant            Assessment & Plan:   Problem List Items Addressed This Visit      Respiratory   URI with cough and congestion    With reassuring exam  Early in illness Disc symptomatic care (watch for fever or wheezing)  AVS: Drink lots of fluids and get extra rest   mucinex DM is good for congestion and cough Tylenol for body aches or  fever or sore throat (or ibuprofen)   Cough will get worse before it gets better   I will send tessalon for cough   If you start wheezing let us know   Update if not starting to improve in a week or if

## 2018-11-02 NOTE — Patient Instructions (Addendum)
Drink lots of fluids and get extra rest   mucinex DM is good for congestion and cough Tylenol for body aches or fever or sore throat (or ibuprofen)   Cough will get worse before it gets better   I will send tessalon for cough   If you start wheezing let us know   Update if not starting to improve in a week or if worsening

## 2018-11-02 NOTE — Telephone Encounter (Signed)
Please check in and make sure she is feeling better -more than likely symptoms were respiratory/related to her cold Thanks

## 2018-11-02 NOTE — Telephone Encounter (Signed)
Pt has an appt today at 3pm with PCP.

## 2018-11-03 ENCOUNTER — Telehealth: Payer: Self-pay | Admitting: *Deleted

## 2018-11-03 NOTE — Telephone Encounter (Signed)
[place], [place], [place]. Called prescription in today, 11-03-2018. Dicyclomine 20 mg, take 1 tab twice daily, # 180 with 3 refills.  Prescriber Alonza Bogus, PA-C.

## 2018-11-03 NOTE — Assessment & Plan Note (Signed)
With reassuring exam  Early in illness Disc symptomatic care (watch for fever or wheezing)  AVS: Drink lots of fluids and get extra rest   mucinex DM is good for congestion and cough Tylenol for body aches or fever or sore throat (or ibuprofen)   Cough will get worse before it gets better   I will send tessalon for cough   If you start wheezing let us know   Update if not starting to improve in a week or if

## 2018-11-09 ENCOUNTER — Other Ambulatory Visit: Payer: Self-pay | Admitting: *Deleted

## 2018-11-09 MED ORDER — VALACYCLOVIR HCL 1 G PO TABS: ORAL_TABLET | ORAL | 1 refills | Status: DC

## 2019-01-26 ENCOUNTER — Telehealth: Payer: BC Managed Care – PPO | Admitting: Nurse Practitioner

## 2019-01-26 DIAGNOSIS — R05 Cough: Secondary | ICD-10-CM

## 2019-01-26 DIAGNOSIS — R059 Cough, unspecified: Secondary | ICD-10-CM

## 2019-01-26 MED ORDER — BENZONATATE 100 MG PO CAPS: 200.0000 mg | ORAL_CAPSULE | Freq: Three times a day (TID) | ORAL | 0 refills | Status: AC | PRN

## 2019-01-26 NOTE — Progress Notes (Signed)
E-Visit for Corona Virus Screening  Based on your current symptoms, you may very well have the virus, however your symptoms are mild. Currently, not all patients are being tested. If the symptoms are mild and there is not a known exposure, performing the test is not indicated.  Coronavirus disease 2019 (COVID-19) is a respiratory illness that can spread from person to person. The virus that causes COVID-19 is a new virus that was first identified in the country of [place] but is now found in multiple other countries and has spread to the [place].  Symptoms associated with the virus are mild to severe fever, cough, and shortness of breath. There is currently no vaccine to protect against COVID-19, and there is no specific antiviral treatment for the virus.   To be considered HIGH RISK for Coronavirus (COVID-19), you have to meet the following criteria:  . Traveled to [place], [place], [place], [place] or [place]; or in the [place] to [place], [place], [place], or [place]; and have fever, cough, and shortness of breath within the last 2 weeks of travel OR  . Been in close contact with a person diagnosed with COVID-19 within the last 2 weeks and have fever, cough, and shortness of breath  . IF YOU DO NOT MEET THESE CRITERIA, YOU ARE CONSIDERED LOW RISK FOR COVID-19.   It is vitally important that if you feel that you have an infection such as this virus or any other virus that you stay home and away from places where you may spread it to others.  You should self-quarantine for 14 days if you have symptoms that could potentially be coronavirus and avoid contact with people age 28 and older.   You can use medication such as A prescription cough medication called Tessalon Perles 100 mg. You may take 1-2 capsules every 8 hours as needed for cough  You may also take acetaminophen (Tylenol) as needed for fever.   Reduce your risk of any infection by using the same precautions used for  avoiding the common cold or flu:  Marland Kitchen Wash your hands often with soap and warm water for at least 20 seconds.  If soap and water are not readily available, use an alcohol-based hand sanitizer with at least 60% alcohol.  . If coughing or sneezing, cover your mouth and nose by coughing or sneezing into the elbow areas of your shirt or coat, into a tissue or into your sleeve (not your hands). . Avoid shaking hands with others and consider head nods or verbal greetings only. . Avoid touching your eyes, nose, or mouth with unwashed hands.  . Avoid close contact with people who are sick. . Avoid places or events with large numbers of people in one location, like concerts or sporting events. . Carefully consider travel plans you have or are making. . If you are planning any travel outside or inside the [place], visit the CDC's Travelers' Health webpage for the latest health notices. . If you have some symptoms but not all symptoms, continue to monitor at home and seek medical attention if your symptoms worsen. . If you are having a medical emergency, call 911.  HOME CARE . Only take medications as instructed by your medical team. . Drink plenty of fluids and get plenty of rest. . A steam or ultrasonic humidifier can help if you have congestion.   GET HELP RIGHT AWAY IF: . You develop worsening fever. . You become short of breath . You cough  up blood. . Your symptoms become more severe MAKE SURE YOU   Understand these instructions.  Will watch your condition.  Will get help right away if you are not doing well or get worse.  Your e-visit answers were reviewed by a board certified advanced clinical practitioner to complete your personal care plan.  Depending on the condition, your plan could have included both over the counter or prescription medications.  If there is a problem please reply once you have received a response from your provider. Your safety is important to [place].  If you have drug  allergies check your prescription carefully.    You can use MyChart to ask questions about today's visit, request a non-urgent call back, or ask for a work or school excuse for 24 hours related to this e-Visit. If it has been greater than 24 hours you will need to follow up with your provider, or enter a new e-Visit to address those concerns. You will get an e-mail in the next two days asking about your experience.  I hope that your e-visit has been valuable and will speed your recovery. Thank you for using e-visits.

## 2019-02-01 MED ORDER — ALBUTEROL SULFATE HFA 108 (90 BASE) MCG/ACT IN AERS: 2.0000 | INHALATION_SPRAY | Freq: Four times a day (QID) | RESPIRATORY_TRACT | 0 refills | Status: DC | PRN

## 2019-02-01 NOTE — Addendum Note (Signed)
Addended by: Evelina Dun A on: 02/01/2019 11:24 AM   Modules accepted: Orders

## 2019-02-09 ENCOUNTER — Encounter: Payer: Self-pay | Admitting: Family Medicine

## 2019-02-16 ENCOUNTER — Encounter: Payer: Self-pay | Admitting: Family Medicine

## 2019-02-16 ENCOUNTER — Ambulatory Visit (INDEPENDENT_AMBULATORY_CARE_PROVIDER_SITE_OTHER): Payer: BC Managed Care – PPO | Admitting: Family Medicine

## 2019-02-16 DIAGNOSIS — B9789 Other viral agents as the cause of diseases classified elsewhere: Secondary | ICD-10-CM | POA: Diagnosis not present

## 2019-02-16 DIAGNOSIS — J069 Acute upper respiratory infection, unspecified: Secondary | ICD-10-CM | POA: Insufficient documentation

## 2019-02-16 MED ORDER — PREDNISONE 10 MG PO TABS: ORAL_TABLET | ORAL | 0 refills | Status: DC

## 2019-02-16 NOTE — Patient Instructions (Addendum)
Continue albuterol  Tylenol as needed for elevated temp if you feel bad  Fill the px for prednisone -hold on to it- if cough does not continue to improve or you wheeze/have more shortness of breath go ahead and take it   If symptoms worsen or if cough becomes productive I think you should go to an urgent care center for an exam and possibly a chest xray  Keep [place] posted Update if not starting to improve in a week or if worsening    We will keep in touch

## 2019-02-16 NOTE — Assessment & Plan Note (Signed)
3 weeks of symptoms covid tested negative  Struggling with respiratory symptoms relieved by albuterol  Also fatigue/deconditioning and intermittent low grade temp  Her progress is re assuring  No doubt some degree of post viral cough (with cough variant reactive airways)  I sent in 30 mg prednisone taper to hold and take if worse or no further improvement (she is apprehensive given hx of anxiety)  Watch for prod cough or worsening symptoms (may need exam and cxr in UC as we cannot do it here)  Update if not starting to improve in a week or if worsening

## 2019-02-16 NOTE — Progress Notes (Signed)
Virtual Visit via Video Note  I connected with Debra Edwards on 02/16/19 at  8:30 AM EDT by a video enabled telemedicine application and verified that I am speaking with the correct person using two identifiers.  Location: Patient: home Provider: office    I discussed the limitations of evaluation and management by telemedicine and the availability of in person appointments. The patient expressed understanding and agreed to proceed.  History of Present Illness: Pt presents for ongoing viral symptoms  3 weeks   Reviewed pt's symptoms journal that she sent to update [place]  Temp -still low grade at times  99.1 on 5/12 Yesterday felt a little more energy  Was able to walk trash to the dumpster  Still more sob than usual when walking (or talking) Yesterday 99.5   Does not feel well with temp in 99s  She gets a headache   She did get tested for covid and test was negative   Today she is still tired  Better than last week  Lungs feel like "they are sore"  Dry cough (never productive) Albuterol helps   A little stuffy nose in ams only  No purulent nasal drainage  Throat -sometimes sore posteriorly from coughing Ears -no problems  Face-no pressure or pain   Has not had a chest xray  No exam   Is staying home/ isolating  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 Negative for chills, diaphoresis and weight loss.  HENT: Positive for congestion and sore throat. Negative for ear discharge, ear pain, nosebleeds and sinus pain.   Eyes: Negative for blurred vision, discharge and redness.  Respiratory: Positive for cough, shortness of breath and wheezing. Negative for hemoptysis, sputum production and stridor.   Cardiovascular: Negative for chest pain, palpitations and leg swelling.  Gastrointestinal: Negative for abdominal pain, diarrhea, heartburn, nausea and vomiting.  Genitourinary: Negative for dysuria.   Musculoskeletal: Positive for myalgias.  Skin: Negative for itching and rash.  Neurological: Positive for headaches. Negative for dizziness and focal weakness.  Endo/Heme/Allergies: Negative for polydipsia.  Psychiatric/Behavioral: The patient is nervous/anxious.       Patient Active Problem List   Diagnosis Date Noted  . Viral URI with cough 02/16/2019  . Diarrhea 06/15/2018  . Vaginitis 04/20/2018  . Motion sickness 04/20/2018  . Spasm of thoracic back muscle 08/11/2016  . Screening mammogram, encounter for 08/11/2016  . 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 Encounter for routine gynecological examination 11/07/2012  . Routine general medical examination at a health care facility 10/27/2012  . Migraine headache 06/24/2012  . Stress reaction 03/16/2012  . GERD (gastroesophageal reflux disease) 02/19/2012  . Generalized abdominal pain 05/04/2011  . IBS (irritable bowel syndrome) 04/14/2011  . OBSTRUCTIVE SLEEP APNEA 09/05/2010  . BRUXISM 07/24/2010  . RESTLESS LEG SYNDROME 07/24/2010  . ALLERGIC RHINITIS 12/17/2009  . NEOPLASM, SKIN, UNCERTAIN BEHAVIOR 67/09/4579  . PTSD 10/20/2007  . FIBROCYSTIC BREAST DISEASE 10/20/2007  . DYSMENORRHEA 10/20/2007   Past Medical History:  Diagnosis Date  . Acid reflux    occasional  . Allergic rhinitis, seasonal    vasomotor rhinitis  . Anemia in the past   is a blood donor  . Anxiety   . Constipation, chronic   . Depression   . Dysmenorrhea   . GERD (gastroesophageal reflux disease)   . Heart murmur    Negative echo in the past  . History of chicken pox   . IBS (irritable bowel syndrome)   .  Migraines   . OSA (obstructive sleep apnea)    PSG 08/20/10 RDI 18.7  . 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 PTSD (post-traumatic stress disorder)    with depressive symptoms  . Urticaria    stress induced   Past Surgical History:  Procedure Laterality Date  . WISDOM TOOTH  EXTRACTION     Social History   Tobacco Use  . Smoking status: Never Smoker  . Smokeless tobacco: Never Used  Substance Use Topics  . Alcohol use: Yes    Alcohol/week: 0.0 standard drinks    Comment: Rare  . Drug use: No   Family History  Problem Relation Age of Onset  . Hypertension Father   . Allergies Father   . Heart disease Father   . Other Mother        Digestive problems-w/u @ [facility]-? bact inf  . Irritable bowel syndrome Mother   . Anxiety disorder Unknown        diffusely in family  . Depression Unknown        diffusely in family  . Diabetes Maternal Grandfather   . Cancer Paternal Grandfather        unsure type  . Colon cancer Neg Hx   . Breast cancer Neg Hx    No Known Allergies Current Outpatient Medications on File Prior to Visit  Medication Sig Dispense Refill  . acetaminophen (TYLENOL) 500 MG tablet Take 500 mg by mouth every 6 (six) hours as needed. For headache    . albuterol (VENTOLIN HFA) 108 (90 Base) MCG/ACT inhaler Inhale 2 puffs into the lungs every 6 (six) hours as needed for wheezing or shortness of breath. 1 Inhaler 0  . benzonatate (TESSALON) 200 MG capsule Take 1 capsule (200 mg total) by mouth 3 (three) times daily as needed. Do not bite pill 30 capsule 1  . buPROPion (WELLBUTRIN XL) 150 MG 24 hr tablet Take 150 mg by mouth every morning.  0  . clonazePAM (KLONOPIN) 0.5 MG tablet Take 0.5 mg by mouth at bedtime.     . cyclobenzaprine (FLEXERIL) 10 MG tablet Take 1 tablet (10 mg total) by mouth 3 (three) times daily as needed. 30 tablet 1  . escitalopram (LEXAPRO) 20 MG tablet Take 20 mg by mouth.     . hyoscyamine (LEVSIN, ANASPAZ) 0.125 MG tablet TAKE 1 TABLET BY MOUTH EVERY 6 HOURS AS NEEDED FOR CRAMPING 30 tablet 5  . levonorgestrel-ethinyl estradiol (ORSYTHIA) 0.1-20 MG-MCG tablet Take 1 tablet by mouth daily. Take continuously without stopping for the placebo week 5 Package 2  . promethazine (PHENERGAN) 25 MG tablet Take 1 tablet (25  mg total) by mouth every 8 (eight) hours as needed for nausea or vomiting. Caution of sedation 30 tablet 0  . valACYclovir (VALTREX) 1000 MG tablet TAKE 2 TABLETS TWICE DAILY FOR 1 DAY AT FIRST SIGN OF A COULD SORE 36 tablet 1  . dicyclomine (BENTYL) 20 MG tablet Take 1 tablet (20 mg total) by mouth 2 (two) times daily. (Patient not taking: Reported on 02/16/2019) 180 tablet 3  . Eszopiclone 3 MG TABS Take 3 mg by mouth once as needed.    . fluticasone (FLONASE) 50 MCG/ACT nasal spray Place 2 sprays into both nostrils daily. (Patient not taking: Reported on 02/16/2019) 16 g 11  . ranitidine (ZANTAC) 150 MG tablet Take 150 mg by mouth daily as needed.      No current  facility-administered medications on file prior to visit.     Observations/Objective Patient appears well, in no distress/ mildly fatigued  Weight is baseline (overweight)  No facial swelling or asymmetry Normal voice-not hoarse and no slurred speech No obvious tremor or mobility impairment Moving neck and UEs normally Able to hear the call well  Coughs one time during visit (dry) , not sob or wheezing with speech Talkative and mentally sharp with no cognitive changes No skin changes on face or neck , no rash or pallor Affect is normal    Assessment and Plan: Problem List Items Addressed This Visit      Respiratory   Viral URI with cough    3 weeks of symptoms covid tested negative  Struggling with respiratory symptoms relieved by albuterol  Also fatigue/deconditioning and intermittent low grade temp  Her progress is re assuring  No doubt some degree of post viral cough (with cough variant reactive airways)  I sent in 30 mg prednisone taper to hold and take if worse or no further improvement (she is apprehensive given hx of anxiety)  Watch for prod cough or worsening symptoms (may need exam and cxr in UC as we cannot do it here)  Update if not starting to improve in a week or if worsening             Follow Up  Instructions: Continue albuterol  Tylenol as needed for elevated temp if you feel bad  Fill the px for prednisone -hold on to it- if cough does not continue to improve or you wheeze/have more shortness of breath go ahead and take it   If symptoms worsen or if cough becomes productive I think you should go to an urgent care center for an exam and possibly a chest xray  Keep [place] posted Update if not starting to improve in a week or if worsening    We will keep in touch    I discussed the assessment and treatment plan with the patient. The patient was provided an opportunity to ask questions and all were answered. The patient agreed with the plan and demonstrated an understanding of the instructions.   The patient was advised to call back or seek an in-person evaluation if the symptoms worsen or if the condition fails to improve as anticipated.     Loura Pardon, MD

## 2019-02-20 ENCOUNTER — Encounter: Payer: Self-pay | Admitting: Family Medicine

## 2019-03-03 ENCOUNTER — Encounter: Payer: Self-pay | Admitting: Family Medicine

## 2019-03-30 ENCOUNTER — Other Ambulatory Visit: Payer: Self-pay | Admitting: Family Medicine

## 2019-03-30 DIAGNOSIS — Z1231 Encounter for screening mammogram for malignant neoplasm of breast: Secondary | ICD-10-CM

## 2019-04-10 ENCOUNTER — Other Ambulatory Visit: Payer: Self-pay

## 2019-04-10 ENCOUNTER — Ambulatory Visit
Admission: RE | Admit: 2019-04-10 | Discharge: 2019-04-10 | Disposition: A | Discharge status: Home / Self Care | Payer: BC Managed Care – PPO | Source: Ambulatory Visit | Attending: Family Medicine | Admitting: Family Medicine

## 2019-04-10 DIAGNOSIS — Z1231 Encounter for screening mammogram for malignant neoplasm of breast: Secondary | ICD-10-CM | POA: Insufficient documentation

## 2019-04-28 ENCOUNTER — Other Ambulatory Visit: Payer: Self-pay | Admitting: Family

## 2019-05-03 ENCOUNTER — Encounter: Payer: Self-pay | Admitting: Family Medicine

## 2019-05-11 ENCOUNTER — Telehealth: Payer: BC Managed Care – PPO | Admitting: Physician Assistant

## 2019-05-11 DIAGNOSIS — R053 Chronic cough: Secondary | ICD-10-CM

## 2019-05-11 DIAGNOSIS — R05 Cough: Secondary | ICD-10-CM

## 2019-05-11 NOTE — Progress Notes (Signed)
Based on what you shared with me, I feel your condition warrants further evaluation and I recommend that you be seen for a face to face office visit.  Giving chronicity of symptoms this warrants a more in-depth assessment and management than is appropriate for e-visit. Please call your PCP office to schedule a video visit on in-office visit to discuss in detail.  NOTE: If you entered your credit card information for this eVisit, you will not be charged. You may see a "hold" on your card for the $35 but that hold will drop off and you will not have a charge processed.  If you are having a true medical emergency please call 911.     For an urgent face to face visit, [place] has five urgent care centers for your convenience:    [email or web address] to reserve your spot online an avoid wait times  Debra Edwards (New Address!) [address] [place], [place] [address] *Just off [address], across the road from [place] hours of operation: Monday-Friday, 12 PM to 6 PM  Closed Saturday & Sunday   The following sites will take your insurance:  . [facility]    [phone]                  Get Driving Directions  [address], [place] [address] . 10 am to 8 pm Monday-Friday . 12 pm to 8 pm Saturday-Sunday   . [facility] at [place]                  Get Driving Directions  [address], [place], [place] [address] . 8 am to 8 pm Monday-Friday . 9 am to 6 pm Saturday . 11 am to 6 pm Sunday   . [facility] at [place]                  Get Driving Directions   [address] [place], [place] [address] . 8 am to 8 pm Monday-Friday . 8 am to 4 pm Saturday-Sunday    . [facility] at [place]                    Get Driving Directions  [phone]  [address] [place]  [place], [place] [address]  . Monday-Friday, 12 PM to 6 PM    Your e-visit answers were reviewed by a board certified advanced clinical practitioner to complete your personal care plan.  Thank you for using e-Visits.

## 2019-05-15 ENCOUNTER — Encounter: Payer: Self-pay | Admitting: Family Medicine

## 2019-05-15 ENCOUNTER — Ambulatory Visit (INDEPENDENT_AMBULATORY_CARE_PROVIDER_SITE_OTHER): Payer: BC Managed Care – PPO | Admitting: Family Medicine

## 2019-05-15 DIAGNOSIS — J45909 Unspecified asthma, uncomplicated: Secondary | ICD-10-CM | POA: Insufficient documentation

## 2019-05-15 DIAGNOSIS — J4520 Mild intermittent asthma, uncomplicated: Secondary | ICD-10-CM | POA: Diagnosis not present

## 2019-05-15 DIAGNOSIS — N760 Acute vaginitis: Secondary | ICD-10-CM

## 2019-05-15 MED ORDER — FLUCONAZOLE 150 MG PO TABS: 150.0000 mg | ORAL_TABLET | Freq: Once | ORAL | 3 refills | Status: AC

## 2019-05-15 MED ORDER — ALBUTEROL SULFATE HFA 108 (90 BASE) MCG/ACT IN AERS: 2.0000 | INHALATION_SPRAY | Freq: Four times a day (QID) | RESPIRATORY_TRACT | 3 refills | Status: DC | PRN

## 2019-05-15 MED ORDER — BUDESONIDE 90 MCG/ACT IN AEPB: 2.0000 | INHALATION_SPRAY | Freq: Two times a day (BID) | RESPIRATORY_TRACT | 5 refills | Status: DC

## 2019-05-15 NOTE — Assessment & Plan Note (Signed)
This occurs off and on since respiratory virus in May  Feeling of chest tightness and irritation relieved by albuterol No triggers  sporadic low grade temps (covid negative last check) Cannot have in office due to intermittent cough Will try pulmicort inhaler bid to help calm inflammation  Disc imp of rinsing mouth after use to prev thrush)  Albuterol prn  Update if not starting to improve in a week or if worsening  , esp if wheezing or fever  Low threshold to re test for covid if worse or no improvement

## 2019-05-15 NOTE — Progress Notes (Signed)
Virtual Visit via Video Note  I connected with Debra Edwards on 05/15/19 at  9:00 AM EDT by a video enabled telemedicine application and verified that I am speaking with the correct person using two identifiers.  Location: Patient: home Provider: office      I discussed the limitations of evaluation and management by telemedicine and the availability of in person appointments. The patient expressed understanding and agreed to proceed.  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  All meetings will be remote   A few times -low grade temp 99s She does not go anywhere  Her things get delivered  She walks in park some ams   Exercise will sometimes make her cough  Better if she wears a mask and keeps her breath warm     Tried cough drops  Albuterol does help soothe it   (was on prednisone in the past)  Uses tessalon-they make her sleepy (?)  GERD-has not had any trouble this summer  Has not needed to take pepcid but maybe once   Also lost 5 lb -intentional   Neg covid test in May   Had e visit-they suggested going to UC   Also suffers from intermittent yeast vaginal infections More since she started exercising - and sweating more  Disc hygiene/keeping dry  Has not used otc cream  Vaginal itch / no discharge  Would like refill of diflucan   Patient Active Problem List    Diagnosis Date Noted  . 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 Viral URI with cough 02/16/2019  . Diarrhea 06/15/2018  . Vaginitis 04/20/2018  . Motion sickness 04/20/2018  . Spasm of thoracic back muscle 08/11/2016  . Screening mammogram, encounter for 08/11/2016  . 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 Encounter for routine gynecological examination 11/07/2012  . Routine general medical examination at a health care facility 10/27/2012  . Migraine headache 06/24/2012  . Stress reaction 03/16/2012  . GERD (gastroesophageal reflux disease) 02/19/2012  . Generalized abdominal pain 05/04/2011  . IBS (irritable bowel syndrome) 04/14/2011  . OBSTRUCTIVE SLEEP APNEA 09/05/2010  . BRUXISM 07/24/2010  . RESTLESS LEG SYNDROME 07/24/2010  . ALLERGIC RHINITIS 12/17/2009  . NEOPLASM, SKIN, UNCERTAIN BEHAVIOR 23/55/7322  . PTSD 10/20/2007  . FIBROCYSTIC BREAST DISEASE 10/20/2007  . DYSMENORRHEA 10/20/2007   Past Medical History:  Diagnosis Date  . Acid reflux    occasional  . Allergic rhinitis, seasonal    vasomotor rhinitis  . Anemia in the past   is a blood donor  . Anxiety   . Constipation, chronic   . Depression   . Dysmenorrhea   . GERD (gastroesophageal reflux disease)   . Heart murmur    Negative echo in the past  . History of chicken pox   .  IBS (irritable bowel syndrome)   . Migraines   . OSA (obstructive sleep apnea)    PSG 08/20/10 RDI 18.7  . 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 PTSD (post-traumatic stress disorder)    with depressive symptoms  . Urticaria    stress induced   Past Surgical History:  Procedure Laterality Date  . WISDOM TOOTH EXTRACTION     Social History   Tobacco Use  . Smoking status: Never Smoker  . Smokeless tobacco: Never Used  Substance Use Topics  . Alcohol use: Yes    Alcohol/week: 0.0 standard drinks    Comment: Rare  . Drug use: No   Family History  Problem Relation  Age of Onset  . Hypertension Father   . Allergies Father   . Heart disease Father   . Other Mother        Digestive problems-w/u @ [facility]-? bact inf  . Irritable bowel syndrome Mother   . Anxiety disorder Other        diffusely in family  . Depression Other        diffusely in family  . Diabetes Maternal Grandfather   . Cancer Paternal Grandfather        unsure type  . Colon cancer Neg Hx   . Breast cancer Neg Hx    No Known Allergies Current Outpatient Medications on File Prior to Visit  Medication Sig Dispense Refill  . acetaminophen (TYLENOL) 500 MG tablet Take 500 mg by mouth every 6 (six) hours as needed. For headache    . benzonatate (TESSALON) 200 MG capsule Take 1 capsule (200 mg total) by mouth 3 (three) times daily as needed. Do not bite pill 30 capsule 1  . clonazePAM (KLONOPIN) 0.5 MG tablet Take 0.5 mg by mouth at bedtime.     . cyclobenzaprine (FLEXERIL) 10 MG tablet Take 1 tablet (10 mg total) by mouth 3 (three) times daily as needed. 30 tablet 1  . dicyclomine (BENTYL) 20 MG tablet Take 1 tablet (20 mg total) by mouth 2 (two) times daily. 180 tablet 3  . escitalopram (LEXAPRO) 20 MG tablet Take 20 mg by mouth.     . Eszopiclone 3 MG TABS Take 3 mg by mouth once as needed.    . famotidine (PEPCID) 20 MG tablet Take 20 mg by mouth daily as needed for heartburn or indigestion.    . fluticasone (FLONASE) 50 MCG/ACT nasal spray Place 2 sprays into both nostrils daily. 16 g 11  . hyoscyamine (LEVSIN, ANASPAZ) 0.125 MG tablet TAKE 1 TABLET BY MOUTH EVERY 6 HOURS AS NEEDED FOR CRAMPING 30 tablet 5  . levonorgestrel-ethinyl estradiol (ORSYTHIA) 0.1-20 MG-MCG tablet Take 1 tablet by mouth daily. Take continuously without stopping for the placebo week 5 Package 2  . promethazine (PHENERGAN) 25 MG tablet Take 1 tablet (25 mg total) by mouth every 8 (eight) hours as needed for nausea or vomiting. Caution of sedation 30 tablet 0  . valACYclovir (VALTREX) 1000 MG tablet TAKE 2  TABLETS TWICE DAILY FOR 1 DAY AT FIRST SIGN OF A COULD SORE 36 tablet 1   No current facility-administered medications on file prior to visit.     Review of Systems  Constitutional: Positive for weight loss. Negative for chills, fever and malaise/fatigue.       Occ low grade temp  Not now  HENT: Negative for congestion, ear discharge, ear pain, sinus pain and sore  throat.   Eyes: Negative for discharge and redness.  Respiratory: Positive for cough and wheezing. Negative for hemoptysis, sputum production and shortness of breath.   Cardiovascular: Negative for chest pain and palpitations.  Gastrointestinal: Negative for abdominal pain, diarrhea, heartburn, nausea and vomiting.  Genitourinary: Negative for dysuria and urgency.       Vaginal itch  Neurological: Negative for dizziness and headaches.    Observations/Objective: Patient appears well, in no distress Weight is baseline  No facial swelling or asymmetry Normal voice-not hoarse and no slurred speech No obvious tremor or mobility impairment Moving neck and UEs normally Able to hear the call well  No cough or shortness of breath during interview  Talkative and mentally sharp with no cognitive changes No skin changes on face or neck , no rash or pallor Affect is normal    Assessment and Plan: Problem List Items Addressed This Visit      Respiratory   Mild reactive airways disease - Primary    This occurs off and on since respiratory virus in May  Feeling of chest tightness and irritation relieved by albuterol No triggers  sporadic low grade temps (covid negative last check) Cannot have in office due to intermittent cough Will try pulmicort inhaler bid to help calm inflammation  Disc imp of rinsing mouth after use to prev thrush)  Albuterol prn  Update if not starting to improve in a week or if worsening  , esp if wheezing or fever  Low threshold to re test for covid if worse or no improvement          Genitourinary    Vaginitis    Frequent yeast vaginitis  Enc use of otc cream for mild symptoms  Diflucan if more severe-refilled Update if no improvement           Follow Up Instructions: For the first signs of vaginal yeast infection -use over the counter cream and only after 3-4 days take diflucan if it does not improve   Try the pulmicort inhaler 2 puffs twice daily  Use albuterol only if needed  If symptoms worsen or you want covid testing please let me know  If no improvement after a week also let me know   I discussed the assessment and treatment plan with the patient. The patient was provided an opportunity to ask questions and all were answered. The patient agreed with the plan and demonstrated an understanding of the instructions.   The patient was advised to call back or seek an in-person evaluation if the symptoms worsen or if the condition fails to improve as anticipated.   Loura Pardon, MD

## 2019-05-15 NOTE — Assessment & Plan Note (Signed)
Frequent yeast vaginitis  Enc use of otc cream for mild symptoms  Diflucan if more severe-refilled Update if no improvement

## 2019-05-15 NOTE — Patient Instructions (Addendum)
For the first signs of vaginal yeast infection -use over the counter cream and only after 3-4 days take diflucan if it does not improve   Try the pulmicort inhaler 2 puffs twice daily  Use albuterol only if needed  If symptoms worsen or you want covid testing please let me know  If no improvement after a week also let me know

## 2019-05-29 ENCOUNTER — Encounter: Payer: Self-pay | Admitting: Family Medicine

## 2019-06-08 ENCOUNTER — Other Ambulatory Visit: Payer: Self-pay | Admitting: Family Medicine

## 2019-06-19 ENCOUNTER — Encounter: Payer: Self-pay | Admitting: Family Medicine

## 2019-06-19 ENCOUNTER — Other Ambulatory Visit: Payer: Self-pay

## 2019-06-19 ENCOUNTER — Ambulatory Visit: Payer: BC Managed Care – PPO | Admitting: Family Medicine

## 2019-06-19 DIAGNOSIS — L03032 Cellulitis of left toe: Secondary | ICD-10-CM | POA: Insufficient documentation

## 2019-06-19 MED ORDER — CEPHALEXIN 500 MG PO CAPS: 500.0000 mg | ORAL_CAPSULE | Freq: Three times a day (TID) | ORAL | 0 refills | Status: DC

## 2019-06-19 NOTE — Progress Notes (Signed)
This visit was conducted in person.  BP 112/68 (BP Location: Left Arm, Patient Position: Sitting, Cuff Size: Normal)   Pulse 66   Temp 97.8 F (36.6 C) (Temporal)   Ht 5' 2.5" (1.588 m)   Wt 167 lb 9 oz (76 kg)   SpO2 99%   BMI 30.16 kg/m    CC: foot/toe redness/swelling Subjective:    Patient ID: Debra Edwards, female    DOB: 1976/08/21, 43 y.o.   MRN: [id number]  HPI: Debra Edwards is a 43 y.o. female presenting on 06/19/2019 for Edema (C/o left 4ht toe swelling and redness.  Redness is spreading to other toes and area is now painful.  Tried soaking foot, applying hydrogen peroxide and rubbing alcohol.  School nurse suggested pt be seen. )   1d h/o L 4th toe pain/swelling. She clipped toenails and treated with abx ointment, progressively worsening. Soaked in hot soapy water (very painful). This morning applied alcohol and hydrogen peroxide to clean toe - no significant pain with this. Has been wrapping with bandaids and neosporin with benefit. Possible spreading redness of toe.   Denies inciting trauma/injury. No known skin break to toe.   Denies fevers/chills, nausea.   Saw school nurse this morning who suggested she schedule appt for eval.      Relevant past medical, surgical, family and social history reviewed and updated as indicated. Interim medical history since our last visit reviewed. Allergies and medications reviewed and updated. Outpatient Medications Prior to Visit  Medication Sig Dispense Refill  . acetaminophen (TYLENOL) 500 MG tablet Take 500 mg by mouth every 6 (six) hours as needed. For headache    . albuterol (VENTOLIN HFA) 108 (90 Base) MCG/ACT inhaler Inhale 2 puffs into the lungs every 6 (six) hours as needed for wheezing or shortness of breath. 18 g 3  . benzonatate (TESSALON) 200 MG capsule Take 1 capsule (200 mg total) by mouth 3 (three) times daily as needed. Do not bite pill 30 capsule 1  . clonazePAM (KLONOPIN) 0.5 MG  tablet Take 0.5 mg by mouth at bedtime.     . cyclobenzaprine (FLEXERIL) 10 MG tablet Take 1 tablet (10 mg total) by mouth 3 (three) times daily as needed. 30 tablet 1  . dicyclomine (BENTYL) 20 MG tablet Take 1 tablet (20 mg total) by mouth 2 (two) times daily. 180 tablet 3  . escitalopram (LEXAPRO) 20 MG tablet Take 20 mg by mouth.     . Eszopiclone 3 MG TABS Take 3 mg by mouth once as needed.    . famotidine (PEPCID) 20 MG tablet Take 20 mg by mouth daily as needed for heartburn or indigestion.    . fluticasone (FLONASE) 50 MCG/ACT nasal spray Place 2 sprays into both nostrils daily. 16 g 11  . hyoscyamine (LEVSIN, ANASPAZ) 0.125 MG tablet TAKE 1 TABLET BY MOUTH EVERY 6 HOURS AS NEEDED FOR CRAMPING 30 tablet 5  . LARISSIA 0.1-20 MG-MCG tablet TAKE 1 TABLET BY MOUTH DAILY. TAKE CONTINUOUSLY WITHOUT STOPPING FOR THE PLACEBO WEEK 112 tablet 0  . promethazine (PHENERGAN) 25 MG tablet Take 1 tablet (25 mg total) by mouth every 8 (eight) hours as needed for nausea or vomiting. Caution of sedation 30 tablet 0  . valACYclovir (VALTREX) 1000 MG tablet TAKE 2 TABLETS TWICE DAILY FOR 1 DAY AT FIRST SIGN OF A COULD SORE 36 tablet 1  . Budesonide 90 MCG/ACT inhaler Inhale 2 puffs into the lungs 2 (two) times daily. (Patient not  taking: Reported on 06/19/2019) 1 each 5   No facility-administered medications prior to visit.      Per HPI unless specifically indicated in ROS section below Review of Systems Objective:    BP 112/68 (BP Location: Left Arm, Patient Position: Sitting, Cuff Size: Normal)   Pulse 66   Temp 97.8 F (36.6 C) (Temporal)   Ht 5' 2.5" (1.588 m)   Wt 167 lb 9 oz (76 kg)   SpO2 99%   BMI 30.16 kg/m   Wt Readings from Last 3 Encounters:  06/19/19 167 lb 9 oz (76 kg)  07/21/18 163 lb (73.9 kg)  07/19/18 161 lb 12 oz (73.4 kg)    Physical Exam Vitals signs and nursing note reviewed.  Constitutional:      Appearance: Normal appearance. She is not ill-appearing.   Musculoskeletal:        General: Tenderness present. No swelling.     Comments:  2+ DP bilaterally  L 4th toe mildly edematous with erythema and maximal tenderness medial nail fold. FROM of toe, discomfort with axillary loading. No streaking redness. No fluctuance or abscess noted  Skin:    General: Skin is warm and dry.     Capillary Refill: Capillary refill takes less than 2 seconds.     Findings: Erythema present.  Neurological:     Mental Status: She is alert.       Assessment & Plan:   Problem List Items Addressed This Visit    Acute paronychia of toe of left foot    Anticipate acute paronychia in setting of h/o ingrown nails. Seems to be improving with topical abx and she desires to avoid oral abx at this time. Reasonable to try topical therapy first along with epsom salt soak. WASP for keflex abx provided with instructions on indications when to start. Pt agrees with plan.       Relevant Medications   cephALEXin (KEFLEX) 500 MG capsule       Meds ordered this encounter  Medications  . cephALEXin (KEFLEX) 500 MG capsule    Sig: Take 1 capsule (500 mg total) by mouth 3 (three) times daily.    Dispense:  21 capsule    Refill:  0   No orders of the defined types were placed in this encounter.  Patient instructions: You have paronychia or nailfold infection. Continue topical antibiotic and epsom salt soaks.  Fill antibiotic printed out today if not improving with above or any new streaking redness develops.   Follow up plan: Return if symptoms worsen or fail to improve.  Ria Bush, MD

## 2019-06-19 NOTE — Patient Instructions (Addendum)
You have paronychia or nailfold infection. Continue topical antibiotic and epsom salt soaks.  Fill antibiotic printed out today if not improving with above or any new streaking redness develops.   Paronychia Paronychia is an infection of the skin that surrounds a nail. It usually affects the skin around a fingernail, but it may also occur near a toenail. It often causes pain and swelling around the nail. In some cases, a collection of pus (abscess) can form near or under the nail.  This condition may develop suddenly, or it may develop gradually over a longer period. In most cases, paronychia is not serious, and it will clear up with treatment. What are the causes? This condition may be caused by bacteria or a fungus. These germs can enter the body through an opening in the skin, such as a cut or a hangnail. What increases the risk? This condition is more likely to develop in people who:  Get their hands wet often, such as those who work as Designer, industrial/product, bartenders, or nurses.  Bite their fingernails or suck their thumbs.  Trim their nails very short.  Have hangnails or injured fingertips.  Get manicures.  Have diabetes. What are the signs or symptoms? Symptoms of this condition include:  Redness and swelling of the skin near the nail.  Tenderness around the nail when you touch the area.  Pus-filled bumps under the skin at the base and sides of the nail (cuticle).  Fluid or pus under the nail.  Throbbing pain in the area. How is this diagnosed? This condition is diagnosed with a physical exam. In some cases, a sample of pus may be tested to determine what type of bacteria or fungus is causing the condition. How is this treated? Treatment depends on the cause and severity of your condition. If your condition is mild, it may clear up on its own in a few days or after soaking in warm water. If needed, treatment may include:  Antibiotic medicine, if your infection is caused by  bacteria.  Antifungal medicine, if your infection is caused by a fungus.  A procedure to drain pus from an abscess.  Anti-inflammatory medicine (corticosteroids). Follow these instructions at home: Wound care  Keep the affected area clean.  Soak the affected area in warm water, if told to do so by your health care provider. You may be told to do this for 20 minutes, 2-3 times a day.  Keep the area dry when you are not soaking it.  Do not try to drain an abscess yourself.  Follow instructions from your health care provider about how to take care of the affected area. Make sure you: ? Wash your hands with soap and water before you change your bandage (dressing). If soap and water are not available, use hand sanitizer. ? Change your dressing as told by your health care provider.  If you had an abscess drained, check the area every day for signs of infection. Check for: ? Redness, swelling, or pain. ? Fluid or blood. ? Warmth. ? Pus or a bad smell. Medicines   Take over-the-counter and prescription medicines only as told by your health care provider.  If you were prescribed an antibiotic medicine, take it as told by your health care provider. Do not stop taking the antibiotic even if you start to feel better. General instructions  Avoid contact with harsh chemicals.  Do not pick at the affected area. Prevention  To prevent this condition from happening again: ? Wear rubber  gloves when washing dishes or doing other tasks that require your hands to get wet. ? Wear gloves if your hands might come in contact with cleaners or other chemicals. ? Avoid injuring your nails or fingertips. ? Do not bite your nails or tear hangnails. ? Do not cut your nails very short. ? Do not cut your cuticles. ? Use clean nail clippers or scissors when trimming nails. Contact a health care provider if:  Your symptoms get worse or do not improve with treatment.  You have continued or increased  fluid, blood, or pus coming from the affected area.  Your finger or knuckle becomes swollen or difficult to move. Get help right away if you have:  A fever or chills.  Redness spreading away from the affected area.  Joint or muscle pain. Summary  Paronychia is an infection of the skin that surrounds a nail. It often causes pain and swelling around the nail. In some cases, a collection of pus (abscess) can form near or under the nail.  This condition may be caused by bacteria or a fungus. These germs can enter the body through an opening in the skin, such as a cut or a hangnail.  If your condition is mild, it may clear up on its own in a few days. If needed, treatment may include medicine or a procedure to drain pus from an abscess.  To prevent this condition from happening again, wear gloves if doing tasks that require your hands to get wet or to come in contact with chemicals. Also avoid injuring your nails or fingertips. This information is not intended to replace advice given to you by your health care provider. Make sure you discuss any questions you have with your health care provider. Document Released: 03/17/2001 Document Revised: 10/08/2017 Document Reviewed: 10/04/2017 Elsevier Patient Education  2020 [employer].

## 2019-06-19 NOTE — Assessment & Plan Note (Signed)
Anticipate acute paronychia in setting of h/o ingrown nails. Seems to be improving with topical abx and she desires to avoid oral abx at this time. Reasonable to try topical therapy first along with epsom salt soak. WASP for keflex abx provided with instructions on indications when to start. Pt agrees with plan.

## 2019-06-27 ENCOUNTER — Encounter: Payer: Self-pay | Admitting: Family Medicine

## 2019-06-28 ENCOUNTER — Other Ambulatory Visit: Payer: Self-pay

## 2019-06-28 DIAGNOSIS — Z20822 Contact with and (suspected) exposure to covid-19: Secondary | ICD-10-CM

## 2019-06-29 LAB — NOVEL CORONAVIRUS, NAA: SARS-CoV-2, NAA: NOT DETECTED

## 2019-07-16 ENCOUNTER — Encounter: Payer: Self-pay | Admitting: Family Medicine

## 2019-07-17 ENCOUNTER — Telehealth: Payer: Self-pay | Admitting: Family Medicine

## 2019-07-17 DIAGNOSIS — Z0000 Encounter for general adult medical examination without abnormal findings: Secondary | ICD-10-CM

## 2019-07-17 NOTE — Telephone Encounter (Signed)
-----   Message from Ellamae Sia sent at 07/10/2019  2:15 PM EDT ----- Regarding: Lab orders for Tuesday, 10.13.20 Patient is scheduled for CPX labs, please order future labs, Thanks , Karna Christmas

## 2019-07-18 ENCOUNTER — Other Ambulatory Visit: Payer: Self-pay

## 2019-07-18 ENCOUNTER — Other Ambulatory Visit: Payer: BC Managed Care – PPO

## 2019-07-18 DIAGNOSIS — Z20822 Contact with and (suspected) exposure to covid-19: Secondary | ICD-10-CM

## 2019-07-20 LAB — NOVEL CORONAVIRUS, NAA: SARS-CoV-2, NAA: NOT DETECTED

## 2019-07-21 ENCOUNTER — Ambulatory Visit (INDEPENDENT_AMBULATORY_CARE_PROVIDER_SITE_OTHER): Payer: BC Managed Care – PPO | Admitting: Family Medicine

## 2019-07-21 ENCOUNTER — Other Ambulatory Visit: Payer: Self-pay

## 2019-07-21 ENCOUNTER — Encounter: Payer: Self-pay | Admitting: Family Medicine

## 2019-07-21 VITALS — BP 114/70 | HR 62 | Temp 98.4°F | Ht 62.25 in | Wt 161.3 lb

## 2019-07-21 DIAGNOSIS — Z0000 Encounter for general adult medical examination without abnormal findings: Secondary | ICD-10-CM

## 2019-07-21 DIAGNOSIS — N946 Dysmenorrhea, unspecified: Secondary | ICD-10-CM | POA: Diagnosis not present

## 2019-07-21 MED ORDER — HYOSCYAMINE SULFATE 0.125 MG PO TABS: ORAL_TABLET | ORAL | 5 refills | Status: DC

## 2019-07-21 MED ORDER — NORETHINDRONE-ETH ESTRADIOL 0.4-35 MG-MCG PO TABS: 1.0000 | ORAL_TABLET | Freq: Every day | ORAL | 3 refills | Status: DC
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
## 2019-07-21 NOTE — Patient Instructions (Addendum)
Let's try a different oral contraceptive (generic of ovcon)  If any issues let me know  Have a period beforehand   Try to keep up with self breast exams   Keep up the great work with diet and exercise   Make sure you get at least 1000 iu of vitamin D daily

## 2019-07-22 LAB — COMPREHENSIVE METABOLIC PANEL
AG Ratio: 2.1 (calc) (ref 1.0–2.5)
ALT: 19 U/L (ref 6–29)
AST: 18 U/L (ref 10–30)
Albumin: 4.2 g/dL (ref 3.6–5.1)
Alkaline phosphatase (APISO): 45 U/L (ref 31–125)
BUN: 9 mg/dL (ref 7–25)
CO2: 24 mmol/L (ref 20–32)
Calcium: 8.6 mg/dL (ref 8.6–10.2)
Chloride: 103 mmol/L (ref 98–110)
Creat: 0.77 mg/dL (ref 0.50–1.10)
Globulin: 2 g/dL (calc) (ref 1.9–3.7)
Glucose, Bld: 85 mg/dL (ref 65–99)
Potassium: 4 mmol/L (ref 3.5–5.3)
Sodium: 139 mmol/L (ref 135–146)
Total Bilirubin: 0.4 mg/dL (ref 0.2–1.2)
Total Protein: 6.2 g/dL (ref 6.1–8.1)

## 2019-07-22 LAB — CBC WITH DIFFERENTIAL/PLATELET
Absolute Monocytes: 465 cells/uL (ref 200–950)
Basophils Absolute: 19 cells/uL (ref 0–200)
Basophils Relative: 0.3 %
Eosinophils Absolute: 149 cells/uL (ref 15–500)
Eosinophils Relative: 2.4 %
HCT: 38.4 % (ref 35.0–45.0)
Hemoglobin: 13.2 g/dL (ref 11.7–15.5)
Lymphs Abs: 2437 cells/uL (ref 850–3900)
MCH: 30.7 pg (ref 27.0–33.0)
MCHC: 34.4 g/dL (ref 32.0–36.0)
MCV: 89.3 fL (ref 80.0–100.0)
MPV: 11.8 fL (ref 7.5–12.5)
Monocytes Relative: 7.5 %
Neutro Abs: 3131 cells/uL (ref 1500–7800)
Neutrophils Relative %: 50.5 %
Platelets: 215 10*3/uL (ref 140–400)
RBC: 4.3 10*6/uL (ref 3.80–5.10)
RDW: 12.3 % (ref 11.0–15.0)
Total Lymphocyte: 39.3 %
WBC: 6.2 10*3/uL (ref 3.8–10.8)

## 2019-07-22 LAB — LIPID PANEL
Cholesterol: 136 mg/dL (ref <200)
HDL: 57 mg/dL (ref >=50)
LDL Cholesterol (Calc): 63 mg/dL (calc)
Non-HDL Cholesterol (Calc): 79 mg/dL (calc) (ref <130)
Total CHOL/HDL Ratio: 2.4 (calc) (ref <5.0)
Triglycerides: 81 mg/dL (ref <150)

## 2019-07-22 LAB — TSH: TSH: 2.98 mIU/L
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
## 2019-07-27 ENCOUNTER — Encounter: Payer: Self-pay | Admitting: Family Medicine

## 2019-09-22 ENCOUNTER — Encounter: Payer: Self-pay | Admitting: Family Medicine

## 2019-09-26 ENCOUNTER — Other Ambulatory Visit: Payer: Self-pay | Admitting: Family Medicine

## 2019-10-17 ENCOUNTER — Encounter: Payer: Self-pay | Admitting: Family Medicine

## 2019-12-02 ENCOUNTER — Other Ambulatory Visit: Payer: Self-pay

## 2019-12-02 ENCOUNTER — Ambulatory Visit: Payer: BC Managed Care – PPO | Attending: Internal Medicine

## 2019-12-02 DIAGNOSIS — Z23 Encounter for immunization: Secondary | ICD-10-CM | POA: Insufficient documentation

## 2019-12-02 NOTE — Progress Notes (Signed)
   Covid-19 Vaccination Clinic  Name:  Debra Edwards    MRN: [id number] DOB: [birth year]  12/02/2019  Ms. Debra Edwards was observed post Covid-19 immunization for 15 minutes without incidence. She was provided with Vaccine Information Sheet and instruction to access the V-Safe system.   Ms. Debra Edwards was instructed to call 911 with any severe reactions post vaccine: Marland Kitchen Difficulty breathing  . Swelling of your face and throat  . A fast heartbeat  . A bad rash all over your body  . Dizziness and weakness    Immunizations Administered    Name Date Dose VIS Date Route   Moderna COVID-19 Vaccine 12/02/2019 11:42 AM 0.5 mL 09/05/2019 Intramuscular   Manufacturer: Moderna   Lot: [id number]   [id number]

## 2019-12-05 ENCOUNTER — Encounter: Payer: Self-pay | Admitting: Family Medicine

## 2019-12-05 NOTE — Telephone Encounter (Signed)
Vaccine is already in [facility]

## 2019-12-10 ENCOUNTER — Encounter: Payer: Self-pay | Admitting: Family Medicine

## 2019-12-30 ENCOUNTER — Ambulatory Visit: Payer: BC Managed Care – PPO | Attending: Internal Medicine

## 2019-12-30 DIAGNOSIS — Z23 Encounter for immunization: Secondary | ICD-10-CM

## 2019-12-30 NOTE — Progress Notes (Signed)
   Covid-19 Vaccination Clinic  Name:  Debra Edwards    MRN: [id number] DOB: [birth year]  12/30/2019  Ms. Debra Edwards was observed post Covid-19 immunization for 15 minutes without incident. She was provided with Vaccine Information Sheet and instruction to access the V-Safe system.   Ms. Debra Edwards was instructed to call 911 with any severe reactions post vaccine: Marland Kitchen Difficulty breathing  . Swelling of face and throat  . A fast heartbeat  . A bad rash all over body  . Dizziness and weakness   Immunizations Administered    Name Date Dose VIS Date Route   Moderna COVID-19 Vaccine 12/30/2019 11:25 AM 0.5 mL 09/05/2019 Intramuscular   Manufacturer: Levan Hurst   [id number]   [id number]

## 2020-01-01 ENCOUNTER — Encounter: Payer: Self-pay | Admitting: Family Medicine

## 2020-02-14 ENCOUNTER — Ambulatory Visit (INDEPENDENT_AMBULATORY_CARE_PROVIDER_SITE_OTHER)
Admission: RE | Admit: 2020-02-14 | Discharge: 2020-02-14 | Disposition: A | Discharge status: Home / Self Care | Payer: BC Managed Care – PPO | Source: Ambulatory Visit

## 2020-02-14 DIAGNOSIS — R112 Nausea with vomiting, unspecified: Secondary | ICD-10-CM | POA: Diagnosis not present

## 2020-02-14 DIAGNOSIS — R197 Diarrhea, unspecified: Secondary | ICD-10-CM

## 2020-02-14 DIAGNOSIS — R110 Nausea: Secondary | ICD-10-CM

## 2020-02-14 MED ORDER — ONDANSETRON HCL 4 MG PO TABS: 4.0000 mg | ORAL_TABLET | Freq: Four times a day (QID) | ORAL | 0 refills | Status: DC | PRN

## 2020-02-14 NOTE — ED Provider Notes (Signed)
Virtual Visit via Video Note:  Debra Edwards  initiated request for Telemedicine visit with [facility] team. I connected with Debra Edwards  on 02/14/2020 at 9:06 AM  for a synchronized telemedicine visit using a video enabled HIPPA compliant telemedicine application. I verified that I am speaking with Debra Edwards  using two identifiers. Sharion Balloon, NP  was physically located in a [facility]gent care site and Raman Feltus was located at a different location.   The limitations of evaluation and management by telemedicine as well as the availability of in-person appointments were discussed. Patient was informed that she  may incur a bill ( including co-pay) for this virtual visit encounter. [name]  expressed understanding and gave verbal consent to proceed with virtual visit.     History of Present Illness:Debra Edwards  is a 44 y.o. female presents for evaluation of 1 day history of nausea, diarrhea, and stomach cramping.  No emesis.  2-3 episodes of diarrhea today.  She denies fever, chills, rash, cough, shortness of breath, or other symptoms.  She denies current pregnancy or breastfeeding.      No Known Allergies   Past Medical History:  Diagnosis Date  . Acid reflux    occasional  . Allergic rhinitis, seasonal    vasomotor rhinitis  . Anemia in the past   is a blood donor  . Anxiety   . Constipation, chronic   . Depression   . Dysmenorrhea   . GERD (gastroesophageal reflux disease)   . Heart murmur    Negative echo in the past  . History of chicken pox   . IBS (irritable bowel syndrome)   . Migraines   . OSA (obstructive sleep apnea)    PSG 08/20/10 RDI 18.7  . 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 PTSD (post-traumatic stress disorder)    with depressive symptoms  . Urticaria    stress induced     Social History    Tobacco Use  . Smoking status: Never Smoker  . Smokeless tobacco: Never Used  Substance Use Topics  . Alcohol use: Yes    Alcohol/week: 0.0 standard drinks    Comment: Rare  . Drug use: No   ROS: as stated in HPI.  All other systems reviewed and negative.      Observations/Objective: Physical Exam  VITALS: Patient denies fever. GENERAL: Alert, appears well and in no acute distress. HEENT: Atraumatic. NECK: Normal movements of the head and neck. CARDIOPULMONARY: No increased WOB. Speaking in clear sentences. I:E ratio WNL.  MS: Moves all visible extremities without noticeable abnormality. PSYCH: Pleasant and cooperative, well-groomed. Speech normal rate and rhythm. Affect is appropriate. Insight and judgement are appropriate. Attention is focused, linear, and appropriate.  NEURO: CN grossly intact. Oriented as arrived to appointment on time with no prompting. Moves both UE equally.  SKIN: No obvious lesions, wounds, erythema, or cyanosis noted on face or hands.   Assessment and Plan:    ICD-10-CM   1. Nausea without vomiting  R11.0   2. Diarrhea, unspecified type  R19.7        Follow Up Instructions: Treating nausea with Zofran and clear liquid diet.  Treating diarrhea with BRAT diet as tolerated. Instructed patient to go to the ED if she has severe diarrhea or develops fever or acute abdominal pain or other concerning  symptoms. Instructed her to follow-up with her PCP or come here to be seen in person if her symptoms persist. Patient agrees to plan of care.    I discussed the assessment and treatment plan with the patient. The patient was provided an opportunity to ask questions and all were answered. The patient agreed with the plan and demonstrated an understanding of the instructions.   The patient was advised to call back or seek an in-person evaluation if the symptoms worsen or if the condition fails to improve as anticipated.      Sharion Balloon, NP  02/14/2020  9:06 AM         Sharion Balloon, NP 02/14/20 [phone]

## 2020-02-14 NOTE — Discharge Instructions (Signed)
Take the antinausea medication as directed.    Keep yourself hydrated with clear liquids, such as water, Gatorade, Pedialyte, Sprite, or ginger ale.    Go to the emergency department if you have severe diarrhea; Or if you develop new symptoms such as fever or abdominal pain.

## 2020-02-19 ENCOUNTER — Encounter: Payer: Self-pay | Admitting: Family Medicine

## 2020-02-26 ENCOUNTER — Other Ambulatory Visit: Payer: Self-pay | Admitting: Family Medicine

## 2020-02-26 DIAGNOSIS — Z1231 Encounter for screening mammogram for malignant neoplasm of breast: Secondary | ICD-10-CM

## 2020-04-10 ENCOUNTER — Ambulatory Visit
Admission: RE | Admit: 2020-04-10 | Discharge: 2020-04-10 | Disposition: A | Discharge status: Home / Self Care | Payer: BC Managed Care – PPO | Source: Ambulatory Visit | Attending: Family Medicine | Admitting: Family Medicine

## 2020-04-10 DIAGNOSIS — Z1231 Encounter for screening mammogram for malignant neoplasm of breast: Secondary | ICD-10-CM | POA: Insufficient documentation

## 2020-04-11 ENCOUNTER — Other Ambulatory Visit: Payer: Self-pay | Admitting: Family Medicine

## 2020-04-11 DIAGNOSIS — R928 Other abnormal and inconclusive findings on diagnostic imaging of breast: Secondary | ICD-10-CM

## 2020-04-11 DIAGNOSIS — N6320 Unspecified lump in the left breast, unspecified quadrant: Secondary | ICD-10-CM

## 2020-04-19 ENCOUNTER — Ambulatory Visit
Admission: RE | Admit: 2020-04-19 | Discharge: 2020-04-19 | Disposition: A | Discharge status: Home / Self Care | Payer: BC Managed Care – PPO | Source: Ambulatory Visit | Attending: Family Medicine | Admitting: Family Medicine

## 2020-04-19 DIAGNOSIS — N6320 Unspecified lump in the left breast, unspecified quadrant: Secondary | ICD-10-CM

## 2020-04-19 DIAGNOSIS — R928 Other abnormal and inconclusive findings on diagnostic imaging of breast: Secondary | ICD-10-CM

## 2020-06-20 ENCOUNTER — Encounter (INDEPENDENT_AMBULATORY_CARE_PROVIDER_SITE_OTHER): Payer: Self-pay

## 2020-06-20 ENCOUNTER — Encounter: Payer: Self-pay | Admitting: Family Medicine

## 2020-06-21 ENCOUNTER — Other Ambulatory Visit: Payer: Self-pay

## 2020-06-21 DIAGNOSIS — H6983 Other specified disorders of Eustachian tube, bilateral: Secondary | ICD-10-CM

## 2020-06-21 MED ORDER — FLUTICASONE PROPIONATE 50 MCG/ACT NA SUSP: 2.0000 | Freq: Every day | NASAL | 11 refills | Status: DC

## 2020-06-27 ENCOUNTER — Other Ambulatory Visit: Payer: Self-pay | Admitting: Family Medicine

## 2020-06-27 ENCOUNTER — Encounter: Payer: Self-pay | Admitting: Family Medicine

## 2020-07-17 ENCOUNTER — Other Ambulatory Visit: Payer: BC Managed Care – PPO

## 2020-07-17 ENCOUNTER — Other Ambulatory Visit: Payer: Self-pay

## 2020-07-17 ENCOUNTER — Telehealth (INDEPENDENT_AMBULATORY_CARE_PROVIDER_SITE_OTHER): Payer: BC Managed Care – PPO | Admitting: Family Medicine

## 2020-07-17 DIAGNOSIS — Z0000 Encounter for general adult medical examination without abnormal findings: Secondary | ICD-10-CM

## 2020-07-17 LAB — COMPREHENSIVE METABOLIC PANEL
ALT: 10 U/L (ref 0–35)
AST: 8 U/L (ref 0–37)
Albumin: 3.6 g/dL (ref 3.5–5.2)
Alkaline Phosphatase: 46 U/L (ref 39–117)
BUN: 10 mg/dL (ref 6–23)
CO2: 27 mEq/L (ref 19–32)
Calcium: 8.2 mg/dL — ABNORMAL LOW (ref 8.4–10.5)
Chloride: 103 mEq/L (ref 96–112)
Creatinine, Ser: 0.83 mg/dL (ref 0.40–1.20)
GFR: 85.23 mL/min (ref >60.00)
Glucose, Bld: 81 mg/dL (ref 70–99)
Potassium: 3.9 mEq/L (ref 3.5–5.1)
Sodium: 135 mEq/L (ref 135–145)
Total Bilirubin: 0.4 mg/dL (ref 0.2–1.2)
Total Protein: 5.8 g/dL — ABNORMAL LOW (ref 6.0–8.3)

## 2020-07-17 LAB — CBC WITH DIFFERENTIAL/PLATELET
Basophils Absolute: 0 10*3/uL (ref 0.0–0.1)
Basophils Relative: 0.5 % (ref 0.0–3.0)
Eosinophils Absolute: 0.1 10*3/uL (ref 0.0–0.7)
Eosinophils Relative: 1.2 % (ref 0.0–5.0)
HCT: 36 % (ref 36.0–46.0)
Hemoglobin: 12.5 g/dL (ref 12.0–15.0)
Lymphocytes Relative: 36.2 % (ref 12.0–46.0)
Lymphs Abs: 1.9 10*3/uL (ref 0.7–4.0)
MCHC: 34.7 g/dL (ref 30.0–36.0)
MCV: 89 fl (ref 78.0–100.0)
Monocytes Absolute: 0.3 10*3/uL (ref 0.1–1.0)
Monocytes Relative: 6.5 % (ref 3.0–12.0)
Neutro Abs: 2.9 10*3/uL (ref 1.4–7.7)
Neutrophils Relative %: 55.6 % (ref 43.0–77.0)
Platelets: 213 10*3/uL (ref 150.0–400.0)
RBC: 4.05 Mil/uL (ref 3.87–5.11)
RDW: 12.3 % (ref 11.5–15.5)
WBC: 5.1 10*3/uL (ref 4.0–10.5)

## 2020-07-17 LAB — LIPID PANEL
Cholesterol: 154 mg/dL (ref 0–200)
HDL: 63 mg/dL (ref >39.00)
LDL Cholesterol: 68 mg/dL (ref 0–99)
NonHDL: 90.53
Total CHOL/HDL Ratio: 2
Triglycerides: 115 mg/dL (ref 0.0–149.0)
VLDL: 23 mg/dL (ref 0.0–40.0)

## 2020-07-17 LAB — TSH: TSH: 3.05 u[IU]/mL (ref 0.35–4.50)

## 2020-07-17 NOTE — Telephone Encounter (Signed)
-----   Message from Ellamae Sia sent at 07/17/2020  7:38 AM EDT ----- Regarding: lab orders for now Patient is scheduled for CPX labs, please order future labs, Thanks , Karna Christmas

## 2020-07-19 ENCOUNTER — Other Ambulatory Visit: Payer: BC Managed Care – PPO

## 2020-07-21 ENCOUNTER — Encounter (INDEPENDENT_AMBULATORY_CARE_PROVIDER_SITE_OTHER): Payer: Self-pay

## 2020-07-23 ENCOUNTER — Encounter: Payer: Self-pay | Admitting: Family Medicine

## 2020-07-23 ENCOUNTER — Ambulatory Visit (INDEPENDENT_AMBULATORY_CARE_PROVIDER_SITE_OTHER): Payer: BC Managed Care – PPO | Admitting: Family Medicine

## 2020-07-23 ENCOUNTER — Other Ambulatory Visit: Payer: Self-pay

## 2020-07-23 VITALS — BP 106/68 | HR 67 | Temp 97.7°F | Ht 62.5 in | Wt 173.5 lb

## 2020-07-23 DIAGNOSIS — Z0000 Encounter for general adult medical examination without abnormal findings: Secondary | ICD-10-CM

## 2020-07-23 DIAGNOSIS — E669 Obesity, unspecified: Secondary | ICD-10-CM

## 2020-07-23 DIAGNOSIS — N946 Dysmenorrhea, unspecified: Secondary | ICD-10-CM | POA: Diagnosis not present

## 2020-07-23 DIAGNOSIS — F4310 Post-traumatic stress disorder, unspecified: Secondary | ICD-10-CM

## 2020-07-23 MED ORDER — PROMETHAZINE HCL 25 MG PO TABS: 25.0000 mg | ORAL_TABLET | Freq: Three times a day (TID) | ORAL | 1 refills | Status: DC | PRN

## 2020-07-23 MED ORDER — BALZIVA 0.4-35 MG-MCG PO TABS: ORAL_TABLET | ORAL | 3 refills | Status: DC

## 2020-07-23 NOTE — Assessment & Plan Note (Signed)
Reviewed health habits including diet and exercise and skin cancer prevention Reviewed appropriate screening tests for age  Also reviewed health mt list, fam hx and immunization status , as well as social and family history   See HPI Labs reviewed  utd vaccines incl covid and interested in a booster when it is approved  Nl breast exam  Pelvic exam deferred (cannot tolerate pelvic exam)  Good cholesterol profile  No need for STD screening

## 2020-07-23 NOTE — Assessment & Plan Note (Signed)
Doing very well with Balsiva OC continuous , no breakthrough bleeding  Not currently sexually active

## 2020-07-23 NOTE — Patient Instructions (Addendum)
The Noom program is good for emotional eating   Take care of yourself  Keep walking   No change in medicines   Eat a well balanced diet with enough protein
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
## 2020-07-23 NOTE — Assessment & Plan Note (Signed)
With anx/dep symptoms  Continues to work with psychiatrist - taking lexapro and klonopin  Enc good self care Enc to continue her counseling as well

## 2020-07-23 NOTE — Assessment & Plan Note (Signed)
Discussed how this problem influences overall health and the risks it imposes  Reviewed plan for weight loss with lower calorie diet (via better food choices and also portion control or program like weight watchers) and exercise building up to or more than 30 minutes 5 days per week including some aerobic activity   Good exercise habits  Recommended NOOM program for emotional eating

## 2020-09-19 ENCOUNTER — Other Ambulatory Visit: Payer: Self-pay | Admitting: Family Medicine

## 2021-01-10 ENCOUNTER — Other Ambulatory Visit: Payer: Self-pay | Admitting: Family Medicine

## 2021-01-13 NOTE — Telephone Encounter (Signed)
Last filled on 07/21/19 #30 tabs with 5 refills, CPE scheduled on 07/30/21

## 2021-03-06 ENCOUNTER — Other Ambulatory Visit: Payer: Self-pay | Admitting: Family Medicine

## 2021-03-06 DIAGNOSIS — Z1231 Encounter for screening mammogram for malignant neoplasm of breast: Secondary | ICD-10-CM

## 2021-04-22 ENCOUNTER — Other Ambulatory Visit: Payer: Self-pay

## 2021-04-22 ENCOUNTER — Ambulatory Visit
Admission: RE | Admit: 2021-04-22 | Discharge: 2021-04-22 | Disposition: A | Discharge status: Home / Self Care | Payer: BC Managed Care – PPO | Source: Ambulatory Visit | Attending: Family Medicine | Admitting: Family Medicine

## 2021-04-22 DIAGNOSIS — Z1231 Encounter for screening mammogram for malignant neoplasm of breast: Secondary | ICD-10-CM | POA: Diagnosis present

## 2021-04-28 ENCOUNTER — Encounter: Payer: Self-pay | Admitting: Family Medicine

## 2021-04-28 ENCOUNTER — Ambulatory Visit: Payer: BC Managed Care – PPO | Admitting: Family Medicine

## 2021-04-28 ENCOUNTER — Other Ambulatory Visit: Payer: Self-pay

## 2021-04-28 DIAGNOSIS — S76302A Unspecified injury of muscle, fascia and tendon of the posterior muscle group at thigh level, left thigh, initial encounter: Secondary | ICD-10-CM | POA: Diagnosis not present

## 2021-04-28 DIAGNOSIS — S76309A Unspecified injury of muscle, fascia and tendon of the posterior muscle group at thigh level, unspecified thigh, initial encounter: Secondary | ICD-10-CM | POA: Insufficient documentation

## 2021-04-28 NOTE — Assessment & Plan Note (Signed)
L hamstring-injured with fast walking on treadmill after period of inactivity before warm up  Pain with palp of L hamstring and also with stretch  No s/s of DVT Adv use of compression sleeve when awake Heat/ice Topical voltaren gel Relative rest  When improved-can start some gentle rehab/stretching  Handouts given  Update if not starting to improve in a week or if worsening  - may consider PT

## 2021-04-28 NOTE — Progress Notes (Signed)
Subjective:    Patient ID: Debra Edwards, female    DOB: Apr 13, 1976, 45 y.o.   MRN: [id number]  This visit occurred during the SARS-CoV-2 public health emergency.  Safety protocols were in place, including screening questions prior to the visit, additional usage of staff PPE, and extensive cleaning of exam room while observing appropriate contact time as indicated for disinfecting solutions.   HPI Pt presents for f/u of L leg injury/hamstring   Wt Readings from Last 3 Encounters:  04/28/21 161 lb 6 oz (73.2 kg)  07/23/20 173 lb 8 oz (78.7 kg)  07/21/19 161 lb 5 oz (73.2 kg)   29.05 kg/m  Returned from [place] on July 12 Was very very active there-walk/swim  Got on the treadmill and felt a pain in L hamstring area  Sharp and immediate Thought it was a cramp   Trying to walk slowly  Sore in that area -radiates up   A little stretching but afraid to  Did swim a bit - gentle kicking made it hurt   No bruising No redness or swelling   Wore supp hose on plane Has a compression sleeve for hamstring   Patient Active Problem List   Diagnosis Date Noted   Hamstring injury 04/28/2021   Obesity (BMI 30-39.9) 07/23/2020   Mild reactive airways disease 05/15/2019   Motion sickness 04/20/2018   Spasm of thoracic back muscle 08/11/2016   Screening mammogram, encounter for 08/11/2016   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   Encounter for routine gynecological examination 11/07/2012   Routine general medical examination at a health care facility 10/27/2012   Migraine headache 06/24/2012   Stress reaction 03/16/2012   GERD (gastroesophageal reflux disease) 02/19/2012   IBS (irritable bowel syndrome) 04/14/2011   OBSTRUCTIVE SLEEP APNEA 09/05/2010   BRUXISM 07/24/2010   RESTLESS LEG SYNDROME 07/24/2010   ALLERGIC RHINITIS 12/17/2009   PTSD 10/20/2007   FIBROCYSTIC BREAST DISEASE 10/20/2007   DYSMENORRHEA 10/20/2007   Past Medical History:  Diagnosis Date   Acid  reflux    occasional   Allergic rhinitis, seasonal    vasomotor rhinitis   Anemia in the past   is a blood donor   Anxiety    Constipation, chronic    Depression    Dysmenorrhea    GERD (gastroesophageal reflux disease)    Heart murmur    Negative echo in the past   History of chicken pox    IBS (irritable bowel syndrome)    Migraines    OSA (obstructive sleep apnea)    PSG 08/20/10 RDI 18.7   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    possibly from growing up on a farm   PTSD (post-traumatic stress disorder)    with depressive symptoms   Urticaria    stress induced   Past Surgical History:  Procedure Laterality Date   WISDOM TOOTH EXTRACTION     Social History   Tobacco Use   Smoking status: Never   Smokeless tobacco: Never  Vaping Use   Vaping Use: Never used  Substance Use Topics   Alcohol use: Yes    Alcohol/week: 0.0 standard drinks    Comment: Rare   Drug use: No   Family History  Problem Relation Age of Onset   Hypertension Father    Allergies Father    Heart disease Father    Other Mother        Digestive problems-w/u @ [facility]-? bact inf   Irritable bowel syndrome Mother  Anxiety disorder Other        diffusely in family   Depression Other        diffusely in family   Diabetes Maternal Grandfather    Cancer Paternal Grandfather        unsure type   Colon cancer Neg Hx    Breast cancer Neg Hx    No Known Allergies Current Outpatient Medications on File Prior to Visit  Medication Sig Dispense Refill   acetaminophen (TYLENOL) 500 MG tablet Take 500 mg by mouth every 6 (six) hours as needed. For headache     BALZIVA 0.4-35 MG-MCG tablet TAKE 1 TABLET BY MOUTH DAILY. TAKE CONTINUOUSLY 112 tablet 2   clonazePAM (KLONOPIN) 0.5 MG tablet Take 0.5 mg by mouth at bedtime.      cyclobenzaprine (FLEXERIL) 10 MG tablet Take 1 tablet (10 mg total) by mouth 3 (three) times daily as needed. 30 tablet 1   dicyclomine (BENTYL) 20 MG tablet  Take 1 tablet (20 mg total) by mouth 2 (two) times daily. 180 tablet 3   escitalopram (LEXAPRO) 20 MG tablet Take 20 mg by mouth.      famotidine (PEPCID) 20 MG tablet Take 20 mg by mouth daily as needed for heartburn or indigestion.     fluticasone (FLONASE) 50 MCG/ACT nasal spray Place 2 sprays into both nostrils daily. 16 g 11   hyoscyamine (LEVSIN) 0.125 MG tablet TAKE 1 TABLET BY MOUTH EVERY 6 HOURS AS NEEDED FOR CRAMPING 30 tablet 5   ondansetron (ZOFRAN) 4 MG tablet Take 1 tablet (4 mg total) by mouth every 6 (six) hours as needed for nausea or vomiting. 12 tablet 0   promethazine (PHENERGAN) 25 MG tablet Take 1 tablet (25 mg total) by mouth every 8 (eight) hours as needed for nausea or vomiting. Caution of sedation 30 tablet 1   valACYclovir (VALTREX) 1000 MG tablet TAKE 2 TABLETS TWICE DAILY FOR 1 DAY AT FIRST SIGN OF A COULD SORE 36 tablet 1   No current facility-administered medications on file prior to visit.    Review of Systems  Constitutional:  Negative for activity change, appetite change, fatigue, fever and unexpected weight change.  HENT:  Negative for congestion, ear pain, rhinorrhea, sinus pressure and sore throat.   Eyes:  Negative for pain, redness and visual disturbance.  Respiratory:  Negative for cough, shortness of breath and wheezing.   Cardiovascular:  Negative for chest pain and palpitations.  Gastrointestinal:  Negative for abdominal pain, blood in stool, constipation and diarrhea.  Endocrine: Negative for polydipsia and polyuria.  Genitourinary:  Negative for dysuria, frequency and urgency.  Musculoskeletal:  Negative for arthralgias, back pain and myalgias.       Left leg pain   Skin:  Negative for pallor and rash.  Allergic/Immunologic: Negative for environmental allergies.  Neurological:  Negative for dizziness, syncope and headaches.  Hematological:  Negative for adenopathy. Does not bruise/bleed easily.  Psychiatric/Behavioral:  Negative for decreased  concentration and dysphoric mood. The patient is not nervous/anxious.       Objective:   Physical Exam Constitutional:      General: She is not in acute distress.    Appearance: Normal appearance. She is normal weight. She is not ill-appearing or diaphoretic.  Eyes:     General: No scleral icterus.    Conjunctiva/sclera: Conjunctivae normal.     Pupils: Pupils are equal, round, and reactive to light.  Cardiovascular:     Rate and Rhythm: Normal rate and regular  rhythm.     Heart sounds: Normal heart sounds.  Pulmonary:     Effort: Pulmonary effort is normal.     Breath sounds: Normal breath sounds.  Chest:     Chest wall: No tenderness.  Musculoskeletal:     Cervical back: Normal range of motion and neck supple. No tenderness.     Right lower leg: No edema.     Left lower leg: Tenderness present. No bony tenderness. No edema.     Comments: Tender over distal hamstring w/o swelling or skin change  No swelling or palp cords  No warmth  Pain to flex hip past 30 degrees  Nl rom of ankle and knee    Baseline divot in post leg with birthmark   Lymphadenopathy:     Cervical: No cervical adenopathy.  Skin:    Coloration: Skin is not pale.     Findings: No bruising, erythema or rash.  Neurological:     Mental Status: She is alert.     Motor: No weakness.     Coordination: Coordination normal.     Deep Tendon Reflexes: Reflexes normal.  Psychiatric:        Mood and Affect: Mood normal.          Assessment & Plan:   Problem List Items Addressed This Visit       Musculoskeletal and Integument   Hamstring injury    L hamstring-injured with fast walking on treadmill after period of inactivity before warm up  Pain with palp of L hamstring and also with stretch  No s/s of DVT Adv use of compression sleeve when awake Heat/ice Topical voltaren gel Relative rest  When improved-can start some gentle rehab/stretching  Handouts given  Update if not starting to improve in a  week or if worsening  - may consider PT

## 2021-04-28 NOTE — Patient Instructions (Addendum)
Compress your hamstring when are active (not when sleeping)  Ice/heat on and off 10 minutes at a time whenever you can   Watch for redness/swelling - let us know   No stretching for now  In 1-2 weeks when feeling better start with slow walking and progress gradually  We can refer to physical therapy later if needed   Try voltaren gel over the counter as directed  Tylenol is ok also

## 2021-05-08 ENCOUNTER — Encounter: Payer: Self-pay | Admitting: Family Medicine

## 2021-05-08 DIAGNOSIS — S76302A Unspecified injury of muscle, fascia and tendon of the posterior muscle group at thigh level, left thigh, initial encounter: Secondary | ICD-10-CM

## 2021-05-16 ENCOUNTER — Telehealth: Payer: Self-pay

## 2021-05-16 MED ORDER — VALACYCLOVIR HCL 1 G PO TABS: ORAL_TABLET | ORAL | 1 refills | Status: DC

## 2021-05-16 NOTE — Telephone Encounter (Signed)
Sent medication   Thanks for sending message to referral pool

## 2021-05-16 NOTE — Telephone Encounter (Addendum)
Sent referral to [facility] in [place]:  [place], [place],  [place], [place], [place]  Phone:  [phone]   Pt notified via [facility]

## 2021-05-16 NOTE — Telephone Encounter (Signed)
She has not heard from anyone about PT. Needs a refill of valacyclovir for cold sores. Asking for a refill. Last written 11-09-18 #36/1. Send to [facility]  Please advise at [phone]. Sent this to the referral pool. Also.

## 2021-06-12 ENCOUNTER — Other Ambulatory Visit: Payer: Self-pay | Admitting: Family Medicine

## 2021-07-23 ENCOUNTER — Other Ambulatory Visit: Payer: BC Managed Care – PPO

## 2021-07-25 IMAGING — MG MM DIGITAL DIAGNOSTIC UNILAT*L* W/ TOMO W/ CAD
6 series · 6 of 18 positions shown · non-contrast
Comparison: Previous exam(s).

CLINICAL DATA: Screening recall for a possible left breast mass
with distortion.

EXAM:
DIGITAL DIAGNOSTIC UNILATERAL LEFT MAMMOGRAM WITH TOMO AND CAD;
ULTRASOUND LEFT BREAST LIMITED

[L CC synth-2D]
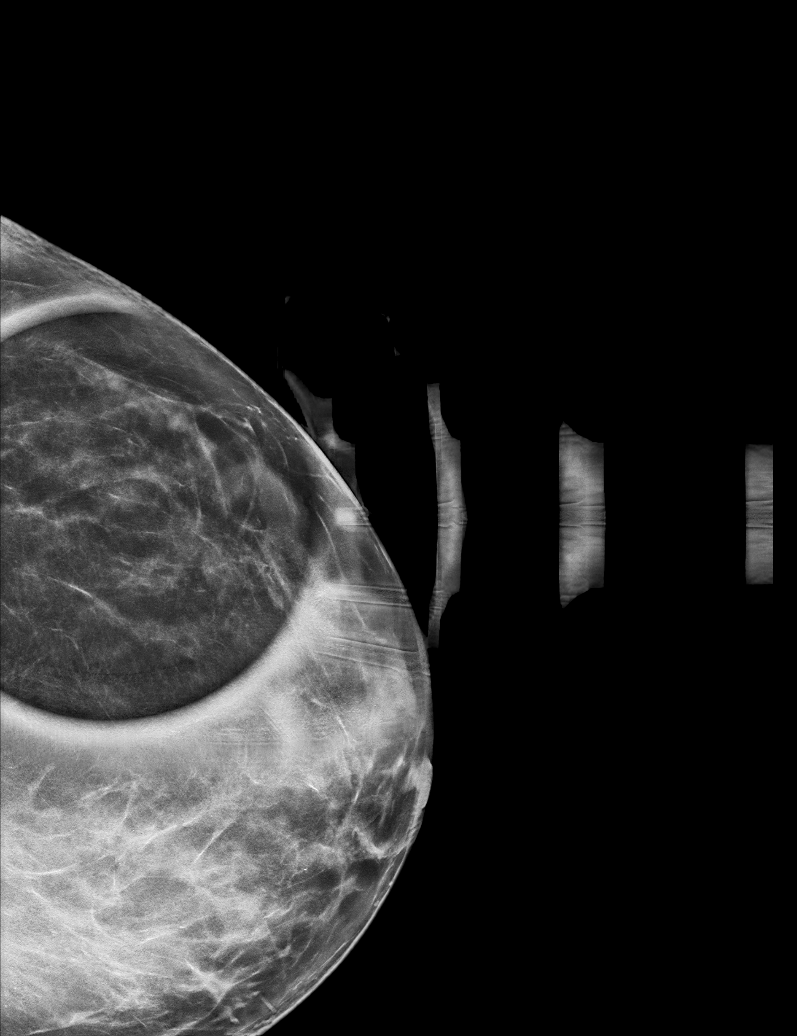

[L ML synth-2D]
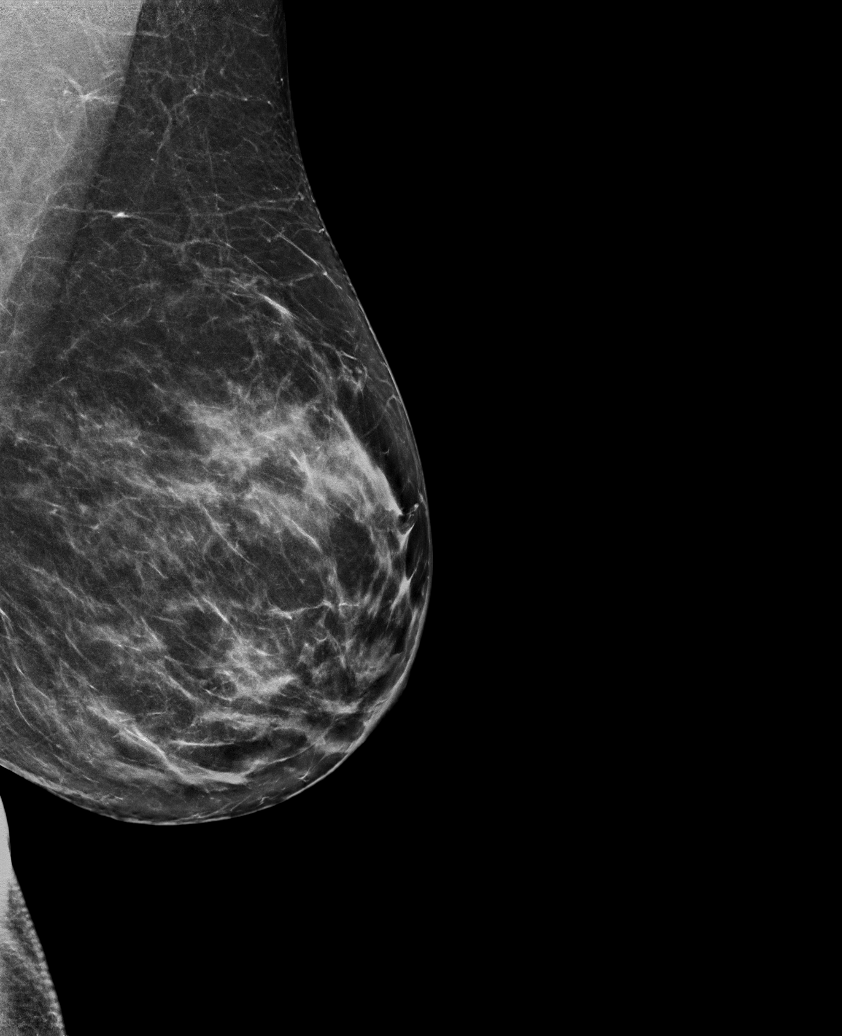

[L MLO synth-2D]
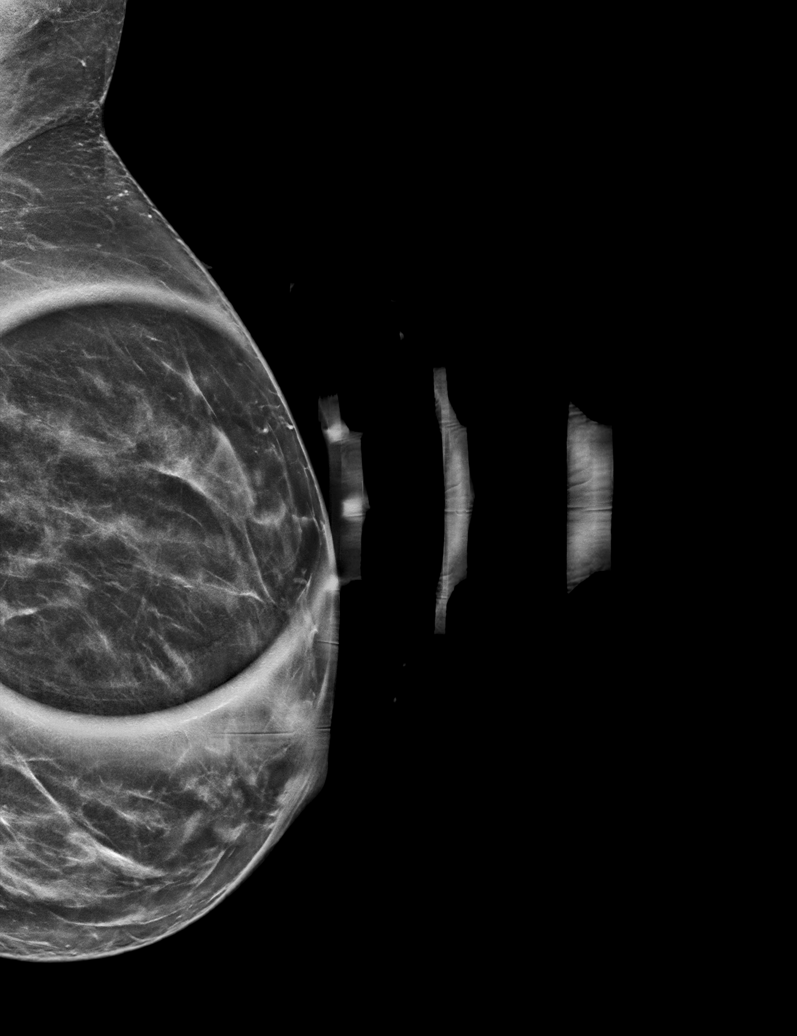

[L CC tomo · tomo slice 32/63.0]
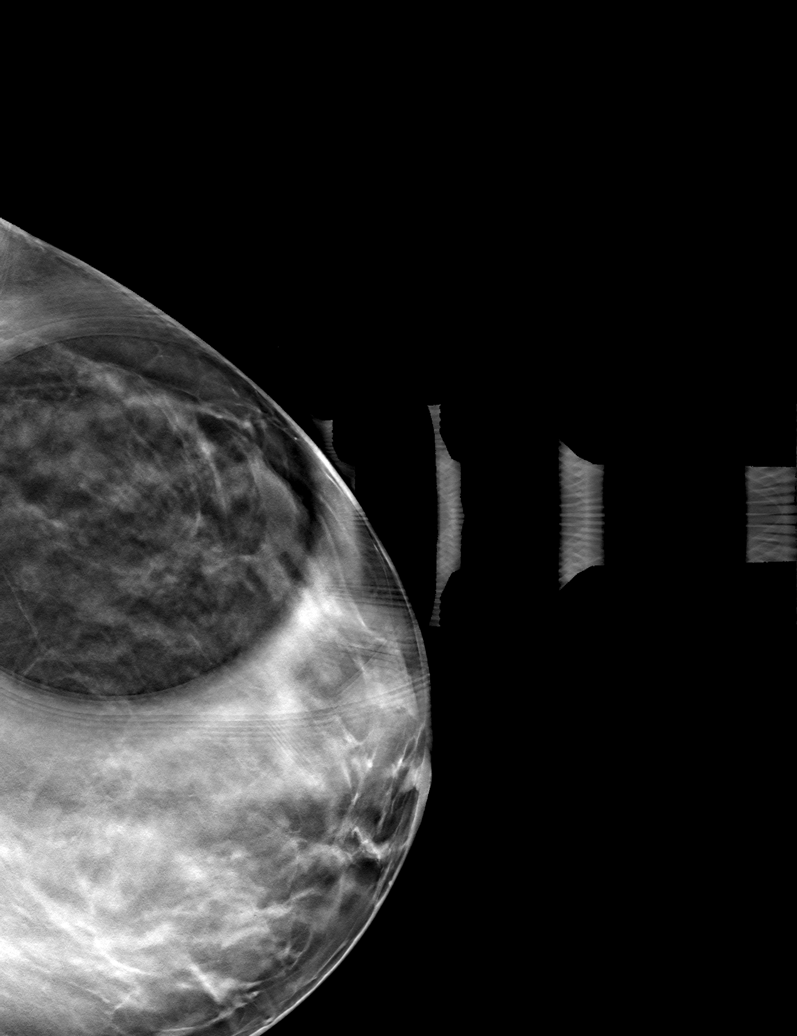

[L MLO tomo · tomo slice 35/68.0]
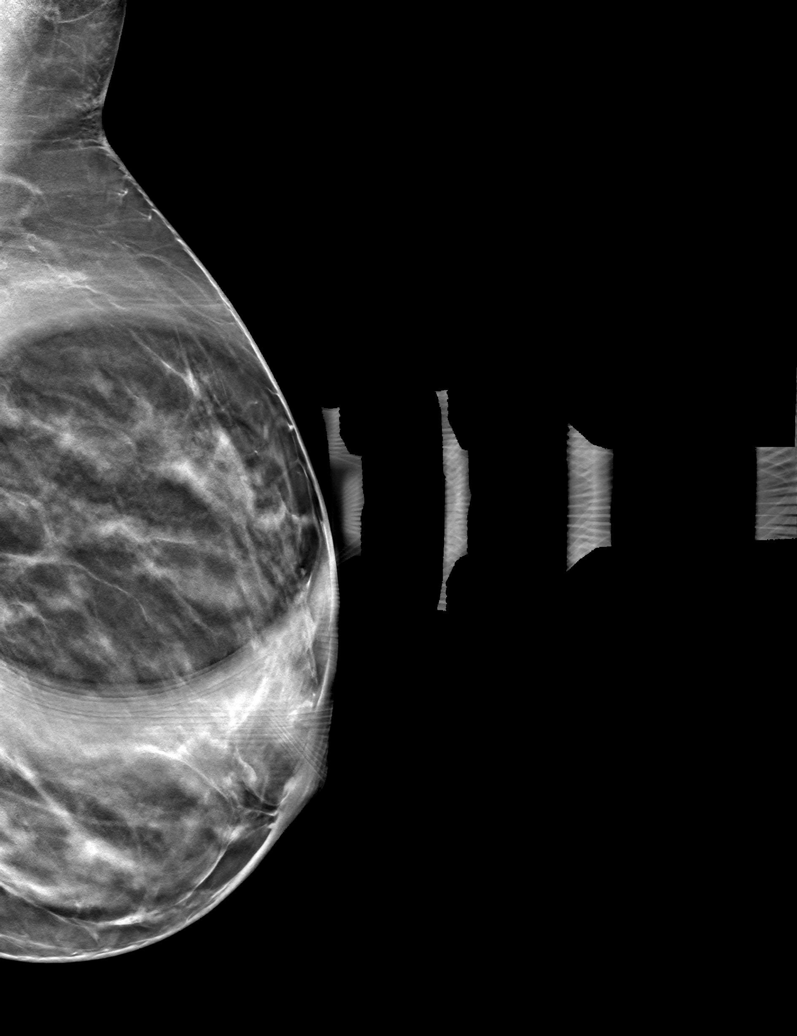

[L ML tomo · tomo slice 37/73.0]
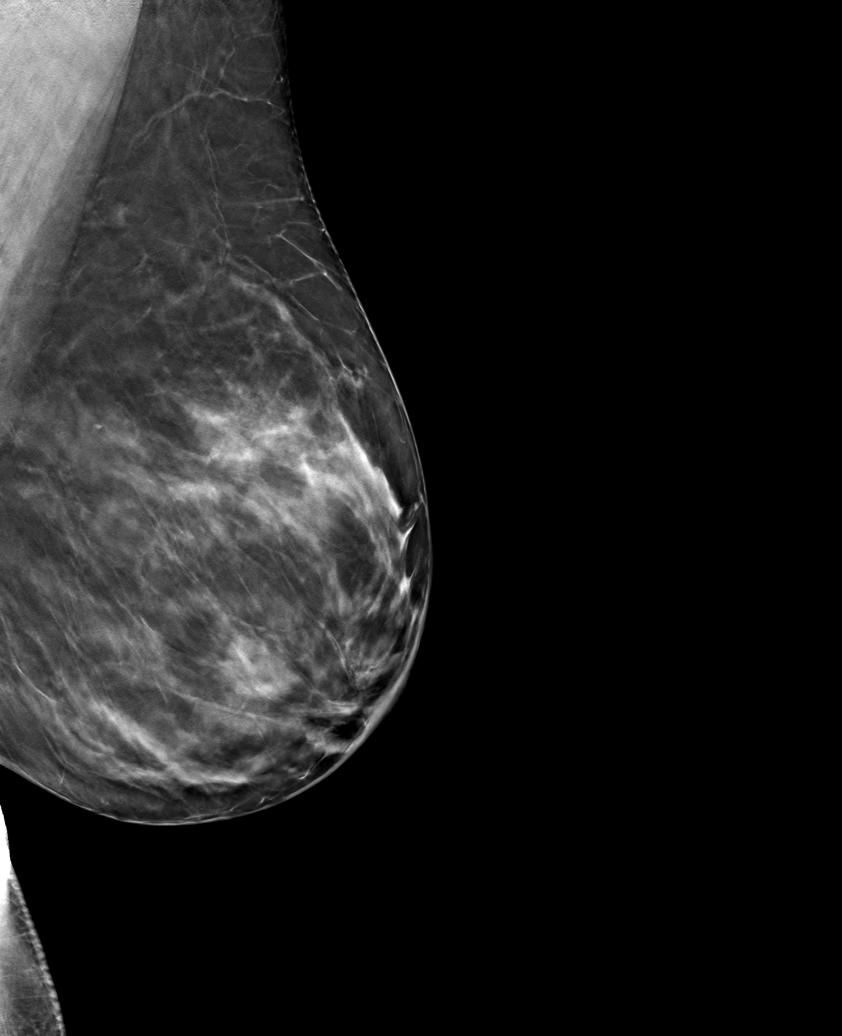

[6 of 18 positions shown; findings below may reference images not displayed]

ACR Breast Density Category c: The breast tissue is heterogeneously
dense, which may obscure small masses.
FINDINGS: The abnormality of concern in the upper-outer quadrant of the left
breast appears to resolve on spot compression tomosynthesis imaging,
and likely represents overlapping fibroglandular tissue. However,
due to the density of the patient's breast tissue, ultrasound will
be performed for further evaluation.

Mammographic images were processed with CAD.

Ultrasound targeted to the upper-outer quadrant of the left breast
demonstrates normal fibroglandular tissue. No suspicious masses or
areas of shadowing are identified.
IMPRESSION: There are no persistent mammographic or targeted sonographic
abnormalities in the upper-outer quadrant of the left breast.

RECOMMENDATION:
Screening mammogram in one year.(Code:08-G-WYD)

I have discussed the findings and recommendations with the patient.
If applicable, a reminder letter will be sent to the patient
regarding the next appointment.

BI-RADS CATEGORY  2: Benign.

## 2021-07-25 IMAGING — US US BREAST*L* LIMITED INC AXILLA
1 series · 4 of 4 positions shown · non-contrast
Comparison: Previous exam(s).

CLINICAL DATA: Screening recall for a possible left breast mass
with distortion.

EXAM:
DIGITAL DIAGNOSTIC UNILATERAL LEFT MAMMOGRAM WITH TOMO AND CAD;
ULTRASOUND LEFT BREAST LIMITED

[Series 1: us breast*left* limited inc axilla · 0.08mm/px · 4 of 4 slices shown]
[im 1/4]
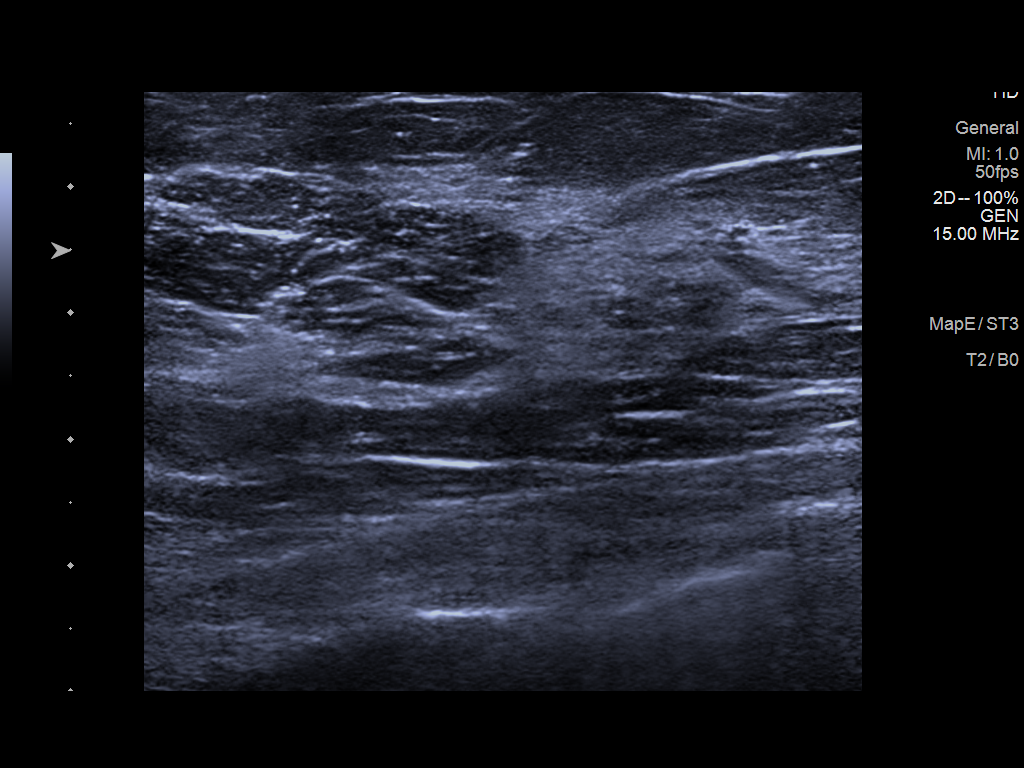
[im 2/4]
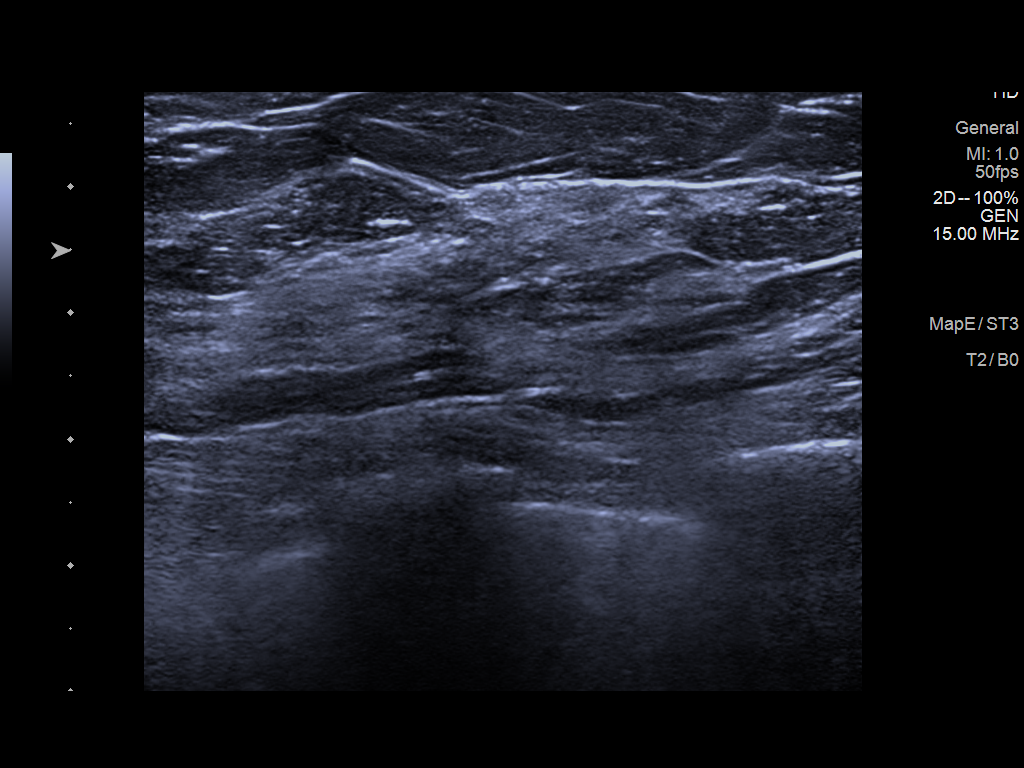
[im 3/4]
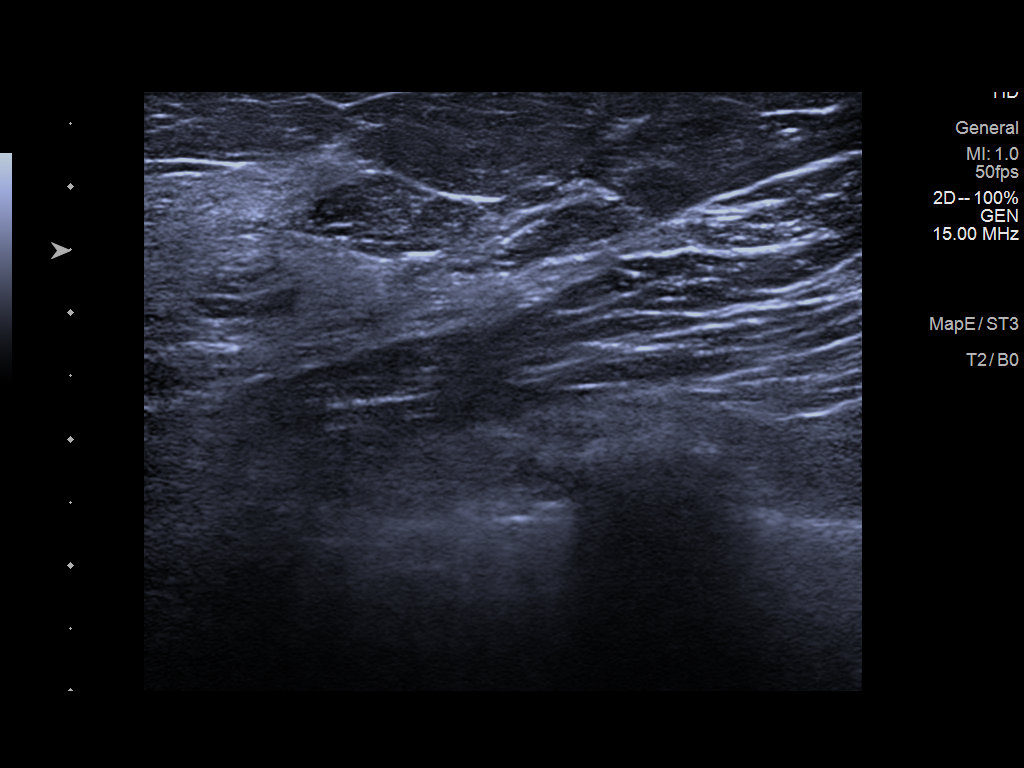
[im 4/4]
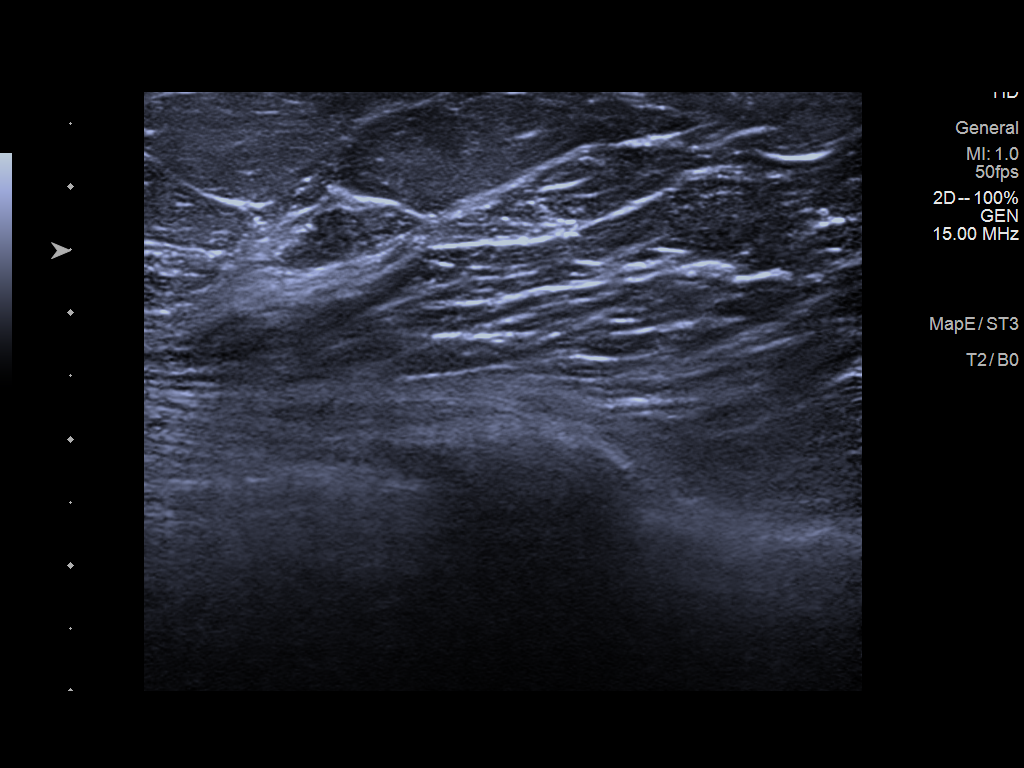

[4 of 4 positions shown; findings below may reference images not displayed]

ACR Breast Density Category c: The breast tissue is heterogeneously
dense, which may obscure small masses.
FINDINGS: The abnormality of concern in the upper-outer quadrant of the left
breast appears to resolve on spot compression tomosynthesis imaging,
and likely represents overlapping fibroglandular tissue. However,
due to the density of the patient's breast tissue, ultrasound will
be performed for further evaluation.

Mammographic images were processed with CAD.

Ultrasound targeted to the upper-outer quadrant of the left breast
demonstrates normal fibroglandular tissue. No suspicious masses or
areas of shadowing are identified.
IMPRESSION: There are no persistent mammographic or targeted sonographic
abnormalities in the upper-outer quadrant of the left breast.

RECOMMENDATION:
Screening mammogram in one year.(Code:[id number]-G-WYD)

I have discussed the findings and recommendations with the patient.
If applicable, a reminder letter will be sent to the patient
regarding the next appointment.

BI-RADS CATEGORY  2: Benign.

## 2021-07-30 ENCOUNTER — Encounter: Payer: Self-pay | Admitting: Family Medicine

## 2021-07-30 ENCOUNTER — Ambulatory Visit (INDEPENDENT_AMBULATORY_CARE_PROVIDER_SITE_OTHER): Payer: BC Managed Care – PPO | Admitting: Family Medicine

## 2021-07-30 ENCOUNTER — Other Ambulatory Visit: Payer: Self-pay

## 2021-07-30 ENCOUNTER — Encounter: Payer: BC Managed Care – PPO | Admitting: Family Medicine

## 2021-07-30 VITALS — BP 124/76 | HR 63 | Temp 98.0°F | Ht 62.5 in | Wt 166.0 lb

## 2021-07-30 DIAGNOSIS — F430 Acute stress reaction: Secondary | ICD-10-CM

## 2021-07-30 DIAGNOSIS — N946 Dysmenorrhea, unspecified: Secondary | ICD-10-CM

## 2021-07-30 DIAGNOSIS — F4310 Post-traumatic stress disorder, unspecified: Secondary | ICD-10-CM | POA: Diagnosis not present

## 2021-07-30 DIAGNOSIS — Z1211 Encounter for screening for malignant neoplasm of colon: Secondary | ICD-10-CM | POA: Insufficient documentation

## 2021-07-30 DIAGNOSIS — Z0000 Encounter for general adult medical examination without abnormal findings: Secondary | ICD-10-CM

## 2021-07-30 NOTE — Assessment & Plan Note (Signed)
More stress after job location change but now doing better Reviewed stressors/ coping techniques/symptoms/ support sources/ tx options and side effects in detail today  Under care of psychiatry  Also in therapy  Plans to continue  Encouraged self care, and physical activity

## 2021-07-30 NOTE — Assessment & Plan Note (Addendum)
Continues OC dosed continuously  May be starting perimenopause (fam hx of early menopause) with some hot flashes and night sweats and brain fog  Will continue to monitor

## 2021-07-30 NOTE — Assessment & Plan Note (Signed)
Continues psychiatric care and counseling

## 2021-07-30 NOTE — Assessment & Plan Note (Signed)
Reviewed health habits including diet and exercise and skin cancer prevention Reviewed appropriate screening tests for age  Also reviewed health mt list, fam hx and immunization status , as well as social and family history   See HPI Labs reviewed  Declines pap/gyn exam for now  Mammogram utd  Discussed colon cancer screening and pt opted for ifob kit Flu shot utd for season covid immunized  PHQ reviewed/ has good mental health care

## 2021-07-30 NOTE — Assessment & Plan Note (Signed)
D/w patient [id number] for colon cancer screening, including IFOB vs. colonoscopy.  Risks and benefits of both were discussed and patient voiced understanding.  Pt elects ZBF:MZUA kit  Plans to consider colonoscopy later

## 2021-07-30 NOTE — Patient Instructions (Addendum)
Options for colon cancer screening include Ifob kit from here Cologuard kit - sent to your home (need to check on the procedure) Colonoscopy   Eat well  Keep working on exercise

## 2021-07-30 NOTE — Progress Notes (Signed)
Subjective:    Patient ID: Debra Edwards, female    DOB: [birth year], 45 y.o.   MRN: [id number]  This visit occurred during the SARS-CoV-2 public health emergency.  Safety protocols were in place, including screening questions prior to the visit, additional usage of staff PPE, and extensive cleaning of exam room while observing appropriate contact time as indicated for disinfecting solutions.   HPI Here for health maintenance exam and to review chronic medical problems    Wt Readings from Last 3 Encounters:  07/30/21 166 lb (75.3 kg)  04/28/21 161 lb 6 oz (73.2 kg)  07/23/20 173 lb 8 oz (78.7 kg)   29.88 kg/m Seeing a nutritionist - working on plant based diet  Likes it  Lost [employer]  Struggled to maintain it  (emotional eating)   Exercise -started getting some treadmill time  Often walks before work   A lot going on  Quit her job after admin problems /did not respond to a problem  There were a lot of people leaving as well  She changed location  Also cat got sick Is adjusting now (very stressful)     Pap- declines/does not tolerate exam  Had nl one in 2014  OC for period control - does continuous dosing  Having night sweats now  (on and off)  Also brain fog  Likely perimenopause   Mom and aunt had menopause at 50    Mammogram 7/22 Self breast exam - no lumps or changes but breasts are more tender   Colon cancer screening  Had colonoscopy years ago for IBS   Flu shot 9/22 Covid9/22 Tdap 3/22  BP Readings from Last 3 Encounters:  07/30/21 124/76  04/28/21 122/64  07/23/20 106/68   Pulse Readings from Last 3 Encounters:  07/30/21 63  04/28/21 73  07/23/20 67   Mood Depression screen PHQ 2/9 07/30/2021 07/23/2020 07/21/2019 07/19/2018 07/14/2017  Decreased Interest 1 1 0 0 1  Down, Depressed, Hopeless 1 2 0 1 2  PHQ - 2 Score 2 3 0 1 3  Altered sleeping 1 0 - - 3  Tired, decreased energy 1 1 - - 0  Change in appetite 3 3 - - 0   Feeling bad or failure about yourself  2 2 - - 3  Trouble concentrating 1 2 - - 1  Moving slowly or fidgety/restless 0 0 - - 0  Suicidal thoughts 0 0 - - 0  PHQ-9 Score 10 11 - - 10  Difficult doing work/chores Very difficult - - - -  Sees therapist and psychiatrist  Taking  Klonopin Belsomra for sleep  Lexapro   Wants to check her vitamin D Some fatigue   Patient Active Problem List   Diagnosis Date Noted   Colon cancer screening 07/30/2021   Hamstring injury 04/28/2021   Mild reactive airways disease 05/15/2019   Motion sickness 04/20/2018   Spasm of thoracic back muscle 08/11/2016   Screening mammogram, encounter for 08/11/2016   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   Encounter for routine gynecological examination 11/07/2012   Routine general medical examination at a health care facility 10/27/2012   Migraine headache 06/24/2012   Stress reaction 03/16/2012   GERD (gastroesophageal reflux disease) 02/19/2012   IBS (irritable bowel syndrome) 04/14/2011   OBSTRUCTIVE SLEEP APNEA 09/05/2010   BRUXISM 07/24/2010   RESTLESS LEG SYNDROME 07/24/2010   ALLERGIC RHINITIS 12/17/2009   PTSD 10/20/2007   FIBROCYSTIC BREAST DISEASE 10/20/2007   DYSMENORRHEA 10/20/2007   Past Medical  History:  Diagnosis Date   Acid reflux    occasional   Allergic rhinitis, seasonal    vasomotor rhinitis   Anemia in the past   is a blood donor   Anxiety    Constipation, chronic    Depression    Dysmenorrhea    GERD (gastroesophageal reflux disease)    Heart murmur    Negative echo in the past   History of chicken pox    IBS (irritable bowel syndrome)    Migraines    OSA (obstructive sleep apnea)    PSG 08/20/10 RDI 18.7   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    possibly from [place] farm   PTSD (post-traumatic stress disorder)    with depressive symptoms   Urticaria    stress induced   Past Surgical History:  Procedure Laterality Date   WISDOM TOOTH  EXTRACTION     Social History   Tobacco Use   Smoking status: Never   Smokeless tobacco: Never  Vaping Use   Vaping Use: Never used  Substance Use Topics   Alcohol use: Yes    Alcohol/week: 0.0 standard drinks    Comment: Rare   Drug use: No   Family History  Problem Relation Age of Onset   Hypertension Father    Allergies Father    Heart disease Father    Other Mother        Digestive problems-w/u @ [facility]-? bact inf   Irritable bowel syndrome Mother    Anxiety disorder Other        diffusely in family   Depression Other        diffusely in family   Diabetes Maternal Grandfather    Cancer Paternal Grandfather        unsure type   Colon cancer Neg Hx    Breast cancer Neg Hx    No Known Allergies Current Outpatient Medications on File Prior to Visit  Medication Sig Dispense Refill   acetaminophen (TYLENOL) 500 MG tablet Take 500 mg by mouth every 6 (six) hours as needed. For headache     BALZIVA 0.4-35 MG-MCG tablet TAKE 1 TABLET BY MOUTH DAILY. TAKE CONTINUOUSLY 84 tablet 0   BELSOMRA 10 MG TABS Take 1 tablet by mouth at bedtime.     clonazePAM (KLONOPIN) 0.5 MG tablet Take 0.5 mg by mouth at bedtime.      cyclobenzaprine (FLEXERIL) 10 MG tablet Take 1 tablet (10 mg total) by mouth 3 (three) times daily as needed. 30 tablet 1   dicyclomine (BENTYL) 20 MG tablet Take 1 tablet (20 mg total) by mouth 2 (two) times daily. 180 tablet 3   escitalopram (LEXAPRO) 20 MG tablet Take 20 mg by mouth.      famotidine (PEPCID) 20 MG tablet Take 20 mg by mouth daily as needed for heartburn or indigestion.     fluticasone (FLONASE) 50 MCG/ACT nasal spray Place 2 sprays into both nostrils daily. 16 g 11   hyoscyamine (LEVSIN) 0.125 MG tablet TAKE 1 TABLET BY MOUTH EVERY 6 HOURS AS NEEDED FOR CRAMPING 30 tablet 5   ondansetron (ZOFRAN) 4 MG tablet Take 1 tablet (4 mg total) by mouth every 6 (six) hours as needed for nausea or vomiting. 12 tablet 0   promethazine (PHENERGAN) 25 MG  tablet Take 1 tablet (25 mg total) by mouth every 8 (eight) hours as needed for nausea or vomiting. Caution of sedation 30 tablet 1   valACYclovir (VALTREX) 1000  MG tablet TAKE 2 TABLETS TWICE DAILY FOR 1 DAY AT FIRST SIGN OF A COULD SORE 36 tablet 1   No current facility-administered medications on file prior to visit.      Review of Systems  Constitutional:  Positive for fatigue. Negative for activity change, appetite change, fever and unexpected weight change.  HENT:  Negative for congestion, ear pain, rhinorrhea, sinus pressure and sore throat.   Eyes:  Negative for pain, redness and visual disturbance.  Respiratory:  Negative for cough, shortness of breath and wheezing.   Cardiovascular:  Negative for chest pain and palpitations.  Gastrointestinal:  Negative for abdominal pain, blood in stool, constipation and diarrhea.  Endocrine: Negative for polydipsia and polyuria.  Genitourinary:  Negative for dysuria, frequency and urgency.  Musculoskeletal:  Negative for arthralgias, back pain and myalgias.  Skin:  Negative for pallor and rash.  Allergic/Immunologic: Negative for environmental allergies.  Neurological:  Negative for dizziness, syncope and headaches.  Hematological:  Negative for adenopathy. Does not bruise/bleed easily.  Psychiatric/Behavioral:  Positive for decreased concentration, dysphoric mood and sleep disturbance. Negative for suicidal ideas. The patient is nervous/anxious.       Objective:   Physical Exam Constitutional:      General: She is not in acute distress.    Appearance: Normal appearance. She is well-developed. She is obese. She is not ill-appearing or diaphoretic.  HENT:     Head: Normocephalic and atraumatic.     Right Ear: Tympanic membrane, ear canal and external ear normal.     Left Ear: Tympanic membrane, ear canal and external ear normal.     Nose: Nose normal. No congestion.     Mouth/Throat:     Mouth: Mucous membranes are moist.     Pharynx:  Oropharynx is clear. No posterior oropharyngeal erythema.  Eyes:     General: No scleral icterus.    Extraocular Movements: Extraocular movements intact.     Conjunctiva/sclera: Conjunctivae normal.     Pupils: Pupils are equal, round, and reactive to light.  Neck:     Thyroid: No thyromegaly.     Vascular: No carotid bruit or JVD.  Cardiovascular:     Rate and Rhythm: Normal rate and regular rhythm.     Pulses: Normal pulses.     Heart sounds: Normal heart sounds.    No gallop.  Pulmonary:     Effort: Pulmonary effort is normal. No respiratory distress.     Breath sounds: Normal breath sounds. No wheezing.     Comments: Good air exch Chest:     Chest wall: No tenderness.  Abdominal:     General: Bowel sounds are normal. There is no distension or abdominal bruit.     Palpations: Abdomen is soft. There is no mass.     Tenderness: There is no abdominal tenderness.     Hernia: No hernia is present.  Genitourinary:    Comments: Breast exam: No mass, nodules, thickening, tenderness, bulging, retraction, inflamation, nipple discharge or skin changes noted.  No axillary or clavicular LA.     Musculoskeletal:        General: No tenderness. Normal range of motion.     Cervical back: Normal range of motion and neck supple. No rigidity. No muscular tenderness.     Right lower leg: No edema.     Left lower leg: No edema.  Lymphadenopathy:     Cervical: No cervical adenopathy.  Skin:    General: Skin is warm and dry.  Coloration: Skin is not pale.     Findings: No erythema or rash.     Comments: Solar lentigines diffusely   Neurological:     Mental Status: She is alert. Mental status is at baseline.     Cranial Nerves: No cranial nerve deficit.     Motor: No abnormal muscle tone.     Coordination: Coordination normal.     Gait: Gait normal.     Deep Tendon Reflexes: Reflexes are normal and symmetric. Reflexes normal.  Psychiatric:        Mood and Affect: Mood normal.         Cognition and Memory: Cognition and memory normal.     Comments: Pleasant  Good mood today          Assessment & Plan:   Problem List Items Addressed This Visit       Genitourinary   DYSMENORRHEA    Continues OC dosed continuously  May be starting perimenopause (fam hx of early menopause) with some hot flashes and night sweats and brain fog  Will continue to monitor        Other   PTSD    Continues psychiatric care and counseling       Stress reaction    More stress after job location change but now doing better Reviewed stressors/ coping techniques/symptoms/ support sources/ tx options and side effects in detail today  Under care of psychiatry  Also in therapy  Plans to continue  Encouraged self care, and physical activity      Routine general medical examination at a health care facility - Primary    Reviewed health habits including diet and exercise and skin cancer prevention Reviewed appropriate screening tests for age  Also reviewed health mt list, fam hx and immunization status , as well as social and family history   See HPI Labs reviewed  Declines pap/gyn exam for now  Mammogram utd  Discussed colon cancer screening and pt opted for ifob kit Flu shot utd for season covid immunized  PHQ reviewed/ has good mental health care       Relevant Orders   CBC with Differential/Platelet   Comprehensive metabolic panel   Lipid panel   TSH   VITAMIN D 25 Hydroxy (Vit-D Deficiency, Fractures)   Colon cancer screening    D/w patient [id number] for colon cancer screening, including IFOB vs. colonoscopy.  Risks and benefits of both were discussed and patient voiced understanding.  Pt elects RSW:[id number] kit  Plans to consider colonoscopy later       Relevant Orders   Fecal occult blood, imunochemical([facility])

## 2021-07-31 LAB — TSH: TSH: 3.42 u[IU]/mL (ref 0.35–5.50)

## 2021-07-31 LAB — CBC WITH DIFFERENTIAL/PLATELET
Basophils Absolute: 0.1 10*3/uL (ref 0.0–0.1)
Basophils Relative: 1 % (ref 0.0–3.0)
Eosinophils Absolute: 0.1 10*3/uL (ref 0.0–0.7)
Eosinophils Relative: 1.7 % (ref 0.0–5.0)
HCT: 39.3 % (ref 36.0–46.0)
Hemoglobin: 13.3 g/dL (ref 12.0–15.0)
Lymphocytes Relative: 38.7 % (ref 12.0–46.0)
Lymphs Abs: 2.4 10*3/uL (ref 0.7–4.0)
MCHC: 33.8 g/dL (ref 30.0–36.0)
MCV: 92 fl (ref 78.0–100.0)
Monocytes Absolute: 0.5 10*3/uL (ref 0.1–1.0)
Monocytes Relative: 7.5 % (ref 3.0–12.0)
Neutro Abs: 3.2 10*3/uL (ref 1.4–7.7)
Neutrophils Relative %: 51.1 % (ref 43.0–77.0)
Platelets: 229 10*3/uL (ref 150.0–400.0)
RBC: 4.28 Mil/uL (ref 3.87–5.11)
RDW: 12.4 % (ref 11.5–15.5)
WBC: 6.2 10*3/uL (ref 4.0–10.5)

## 2021-07-31 LAB — COMPREHENSIVE METABOLIC PANEL
ALT: 14 U/L (ref 0–35)
AST: 17 U/L (ref 0–37)
Albumin: 4.1 g/dL (ref 3.5–5.2)
Alkaline Phosphatase: 44 U/L (ref 39–117)
BUN: 12 mg/dL (ref 6–23)
CO2: 28 mEq/L (ref 19–32)
Calcium: 8.6 mg/dL (ref 8.4–10.5)
Chloride: 101 mEq/L (ref 96–112)
Creatinine, Ser: 0.79 mg/dL (ref 0.40–1.20)
GFR: 90.02 mL/min (ref >60.00)
Glucose, Bld: 81 mg/dL (ref 70–99)
Potassium: 3.9 mEq/L (ref 3.5–5.1)
Sodium: 137 mEq/L (ref 135–145)
Total Bilirubin: 0.3 mg/dL (ref 0.2–1.2)
Total Protein: 6.7 g/dL (ref 6.0–8.3)

## 2021-07-31 LAB — LIPID PANEL
Cholesterol: 155 mg/dL (ref 0–200)
HDL: 65.9 mg/dL (ref >39.00)
LDL Cholesterol: 71 mg/dL (ref 0–99)
NonHDL: 89.18
Total CHOL/HDL Ratio: 2
Triglycerides: 91 mg/dL (ref 0.0–149.0)
VLDL: 18.2 mg/dL (ref 0.0–40.0)

## 2021-07-31 LAB — VITAMIN D 25 HYDROXY (VIT D DEFICIENCY, FRACTURES): VITD: 38.56 ng/mL (ref 30.00–100.00)

## 2021-09-03 ENCOUNTER — Other Ambulatory Visit: Payer: Self-pay | Admitting: Family Medicine

## 2022-01-26 ENCOUNTER — Ambulatory Visit: Payer: BC Managed Care – PPO | Admitting: Family Medicine

## 2022-01-26 ENCOUNTER — Encounter: Payer: Self-pay | Admitting: Family Medicine

## 2022-01-26 DIAGNOSIS — N951 Menopausal and female climacteric states: Secondary | ICD-10-CM | POA: Insufficient documentation

## 2022-01-26 DIAGNOSIS — F4310 Post-traumatic stress disorder, unspecified: Secondary | ICD-10-CM

## 2022-01-26 NOTE — Progress Notes (Signed)
? ?Subjective:  ? ? Patient ID: Debra Edwards, female    DOB: 07-31-1976, 46 y.o.   MRN: [id number] ? ?HPI ?Pt presents for hormone problems ? ?Wt Readings from Last 3 Encounters:  ?01/26/22 174 lb 4 oz (79 kg)  ?07/30/21 166 lb (75.3 kg)  ?04/28/21 161 lb 6 oz (73.2 kg)  ? ?31.36 kg/m? ? ?Hormone changes  ?Last visit discussed holding OC to see how she is  ?Had a rebound period  ?Skipped a period but had period symptoms ?Then light bleeding  ?Then 6 wk, 2 wk -irregular menses  ?Felt like menses became longer and heavier  ? ?Started back on the OC for 1 mo (body is trying to spot and headache)  ?Night sweats are back /sometimes she has to change sheets ?More yeast infections/stays damp  ?Mood swings -talked with her psychiatrist (weepy/down/irritability/panic attacks)  ? ?Sees nutritionist  ?Whole foods/plant based ?Exercise  ? ?Occ stress eats  (at work)  ? ? ?Takes lexapro and pristiq and belsorma  ?Pristique helps energy and concentration  ? ? ?Oc balziva  ? ?Patient Active Problem List  ? Diagnosis Date Noted  ? Perimenopause 01/26/2022  ? Colon cancer screening 07/30/2021  ? Hamstring injury 04/28/2021  ? Mild reactive airways disease 05/15/2019  ? Motion sickness 04/20/2018  ? Spasm of thoracic back muscle 08/11/2016  ? Screening mammogram, encounter for 08/11/2016  ? 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 Encounter for routine gynecological examination 11/07/2012  ? Routine general medical examination at a health care facility 10/27/2012  ? Migraine headache 06/24/2012  ? Stress reaction 03/16/2012  ? GERD (gastroesophageal reflux disease) 02/19/2012  ? IBS (irritable bowel syndrome) 04/14/2011  ? OBSTRUCTIVE SLEEP APNEA 09/05/2010  ? BRUXISM 07/24/2010  ? RESTLESS LEG SYNDROME 07/24/2010  ? ALLERGIC RHINITIS 12/17/2009  ? PTSD 10/20/2007  ? FIBROCYSTIC BREAST DISEASE 10/20/2007  ? DYSMENORRHEA 10/20/2007  ? ?Past Medical History:  ?Diagnosis Date  ? Acid reflux   ? occasional  ? Allergic rhinitis,  seasonal   ? vasomotor rhinitis  ? Anemia in the past  ? is a blood donor  ? Anxiety   ? Constipation, chronic   ? Depression   ? Dysmenorrhea   ? GERD (gastroesophageal reflux disease)   ? Heart murmur   ? Negative echo in the past  ? History of chicken pox   ? IBS (irritable bowel syndrome)   ? Migraines   ? OSA (obstructive sleep apnea)   ? PSG 08/20/10 RDI 18.7  ? 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 possibly from [place] a farm  ? PTSD (post-traumatic stress disorder)   ? with depressive symptoms  ? Urticaria   ? stress induced  ? ?Past Surgical History:  ?Procedure Laterality Date  ? WISDOM TOOTH EXTRACTION    ? ?Social History  ? ?Tobacco Use  ? Smoking status: Never  ? Smokeless tobacco: Never  ?Vaping Use  ? Vaping Use: Never used  ?Substance Use Topics  ? Alcohol use: Yes  ?  Alcohol/week: 0.0 standard drinks  ?  Comment: Rare  ? Drug use: No  ? ?Family History  ?Problem Relation Age of Onset  ? Hypertension Father   ? Allergies Father   ? Heart disease Father   ? Other Mother   ?     Digestive problems-w/u @ [facility]-? bact inf  ? Irritable bowel syndrome Mother   ? Anxiety disorder Other   ?  diffusely in family  ? Depression Other   ?     diffusely in family  ? Diabetes Maternal Grandfather   ? Cancer Paternal Grandfather   ?     unsure type  ? Colon cancer Neg Hx   ? Breast cancer Neg Hx   ? ?No Known Allergies ?Current Outpatient Medications on File Prior to Visit  ?Medication Sig Dispense Refill  ? acetaminophen (TYLENOL) 500 MG tablet Take 500 mg by mouth every 6 (six) hours as needed. For headache    ? BALZIVA 0.4-35 MG-MCG tablet TAKE 1 TABLET BY MOUTH DAILY. TAKE CONTINUOUSLY 112 tablet 3  ? BELSOMRA 10 MG TABS Take 1 tablet by mouth at bedtime.    ? clonazePAM (KLONOPIN) 0.5 MG tablet Take 0.5 mg by mouth at bedtime.     ? cyclobenzaprine (FLEXERIL) 10 MG tablet Take 1 tablet (10 mg total) by mouth 3 (three) times daily as needed. 30 tablet 1  ? desvenlafaxine  (PRISTIQ) 50 MG 24 hr tablet Take 50 mg by mouth every morning.    ? dicyclomine (BENTYL) 20 MG tablet Take 1 tablet (20 mg total) by mouth 2 (two) times daily. 180 tablet 3  ? escitalopram (LEXAPRO) 20 MG tablet Take 20 mg by mouth.     ? famotidine (PEPCID) 20 MG tablet Take 20 mg by mouth daily as needed for heartburn or indigestion.    ? fluticasone (FLONASE) 50 MCG/ACT nasal spray Place 2 sprays into both nostrils daily. 16 g 11  ? hyoscyamine (LEVSIN) 0.125 MG tablet TAKE 1 TABLET BY MOUTH EVERY 6 HOURS AS NEEDED FOR CRAMPING 30 tablet 5  ? ondansetron (ZOFRAN) 4 MG tablet Take 1 tablet (4 mg total) by mouth every 6 (six) hours as needed for nausea or vomiting. 12 tablet 0  ? promethazine (PHENERGAN) 25 MG tablet Take 1 tablet (25 mg total) by mouth every 8 (eight) hours as needed for nausea or vomiting. Caution of sedation 30 tablet 1  ? valACYclovir (VALTREX) 1000 MG tablet TAKE 2 TABLETS TWICE DAILY FOR 1 DAY AT FIRST SIGN OF A COULD SORE 36 tablet 1  ? ?No current facility-administered medications on file prior to visit.  ?  ?Review of Systems  ?Constitutional:  Positive for fatigue. Negative for activity change, appetite change, fever and unexpected weight change.  ?HENT:  Negative for congestion, ear pain, rhinorrhea, sinus pressure and sore throat.   ?Eyes:  Negative for pain, redness and visual disturbance.  ?Respiratory:  Negative for cough, shortness of breath and wheezing.   ?Cardiovascular:  Negative for chest pain and palpitations.  ?Gastrointestinal:  Negative for abdominal pain, blood in stool, constipation and diarrhea.  ?Endocrine: Negative for polydipsia and polyuria.  ?Genitourinary:  Positive for menstrual problem. Negative for dysuria, frequency and urgency.  ?Musculoskeletal:  Positive for arthralgias. Negative for back pain and myalgias.  ?Skin:  Negative for pallor and rash.  ?Allergic/Immunologic: Negative for environmental allergies.  ?Neurological:  Positive for headaches. Negative  for dizziness and syncope.  ?Hematological:  Negative for adenopathy. Does not bruise/bleed easily.  ?Psychiatric/Behavioral:  Positive for decreased concentration, dysphoric mood and sleep disturbance. The patient is nervous/anxious.   ? ?   ?Objective:  ? Physical Exam ?Constitutional:   ?   General: She is not in acute distress. ?   Appearance: Normal appearance. She is well-developed. She is obese. She is not ill-appearing or diaphoretic.  ?HENT:  ?   Head: Normocephalic and atraumatic.  ?Eyes:  ?  Conjunctiva/sclera: Conjunctivae normal.  ?   Pupils: Pupils are equal, round, and reactive to light.  ?Neck:  ?   Thyroid: No thyromegaly.  ?   Vascular: No carotid bruit or JVD.  ?Cardiovascular:  ?   Rate and Rhythm: Normal rate and regular rhythm.  ?   Heart sounds: Normal heart sounds.  ?  No gallop.  ?Pulmonary:  ?   Effort: Pulmonary effort is normal. No respiratory distress.  ?   Breath sounds: Normal breath sounds. No wheezing or rales.  ?Abdominal:  ?   General: There is no distension or abdominal bruit.  ?   Palpations: Abdomen is soft.  ?   Tenderness: There is no right CVA tenderness or left CVA tenderness.  ?   Comments: No suprapubic tenderness or fullness  ?  ?Musculoskeletal:  ?   Cervical back: Normal range of motion and neck supple.  ?   Right lower leg: No edema.  ?   Left lower leg: No edema.  ?Lymphadenopathy:  ?   Cervical: No cervical adenopathy.  ?Skin: ?   General: Skin is warm and dry.  ?   Coloration: Skin is not pale.  ?   Findings: No rash.  ?Neurological:  ?   Mental Status: She is alert.  ?   Cranial Nerves: No cranial nerve deficit.  ?   Coordination: Coordination normal.  ?   Deep Tendon Reflexes: Reflexes are normal and symmetric. Reflexes normal.  ?Psychiatric:     ?   Attention and Perception: Attention normal.     ?   Mood and Affect: Mood is anxious.     ?   Speech: Speech normal.     ?   Cognition and Memory: Cognition and memory normal.  ?   Comments: Mildly anxious  ?Pleasant    ? ? ? ? ? ?   ?Assessment & Plan:  ? ?Problem List Items Addressed This Visit   ? ?  ? Other  ? Perimenopause  ?  In pt who used to take continuous OC , and held it to see if menopausal (resulted in irr

## 2022-01-26 NOTE — Patient Instructions (Addendum)
I placed a referral to gyn  ?If you don't here in 1-2 weeks call us  ? ?Stay on the oral contraceptive for now (daily/ without a withdrawal week)  ?You may have some breakthrough bleeding  ? ?Keep up the good work with healthy diet and exercise  ? ? ? ? ?

## 2022-01-26 NOTE — Assessment & Plan Note (Signed)
Anxious and depressed mood with panic attacks -now with hormone change/perimenopause ?[facility] [facility]  ?

## 2022-01-26 NOTE — Assessment & Plan Note (Signed)
In pt who used to take continuous OC , and held it to see if menopausal (resulted in irreg menses and vasomotor symptoms  ?Now back on OC [name] 0.4-35) for a month and still night sweats, mood swings and trouble sleeping  ?Taking SSRI and SNRI for depression and anxiety already  ? ?inst to continue the OC continuously  ?Referral done to gyn to discuss any other options (if any) for her symptoms  ?Good health habits (diet and exercise) -urged to keep that up ?

## 2022-02-25 ENCOUNTER — Other Ambulatory Visit: Payer: Self-pay | Admitting: Family Medicine

## 2022-02-25 DIAGNOSIS — Z1231 Encounter for screening mammogram for malignant neoplasm of breast: Secondary | ICD-10-CM

## 2022-03-24 ENCOUNTER — Encounter: Payer: Self-pay | Admitting: Obstetrics & Gynecology

## 2022-03-24 ENCOUNTER — Ambulatory Visit: Payer: BC Managed Care – PPO | Admitting: Obstetrics & Gynecology

## 2022-03-24 ENCOUNTER — Other Ambulatory Visit: Payer: Self-pay | Admitting: Family Medicine

## 2022-03-24 VITALS — BP 98/66 | HR 73 | Wt 179.8 lb

## 2022-03-24 DIAGNOSIS — N924 Excessive bleeding in the premenopausal period: Secondary | ICD-10-CM

## 2022-03-24 DIAGNOSIS — H6983 Other specified disorders of Eustachian tube, bilateral: Secondary | ICD-10-CM

## 2022-03-24 MED ORDER — MEGESTROL ACETATE 40 MG PO TABS: 40.0000 mg | ORAL_TABLET | Freq: Every day | ORAL | 5 refills | Status: DC
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
## 2022-03-24 NOTE — Patient Instructions (Signed)
Endometrial Biopsy  An endometrial biopsy is a procedure to remove tissue samples from the endometrium, which is the lining of the uterus. The tissue that is removed can then be checked under a microscope for disease. This procedure is used to diagnose conditions such as endometrial cancer, endometrial tuberculosis, polyps, or other inflammatory conditions. This procedure may also be used to investigate uterine bleeding to determine where you are in your menstrual cycle or how your hormone levels are affecting the lining of the uterus. Tell a health care provider about: Any allergies you have. All medicines you are taking, including vitamins, herbs, eye drops, creams, and over-the-counter medicines. Any problems you or family members have had with anesthetic medicines. Any blood disorders you have. Any surgeries you have had. Any medical conditions you have. Whether you are pregnant or may be pregnant. What are the risks? Generally, this is a safe procedure. However, problems may occur, including: Bleeding. Pelvic infection. Puncture of the wall of the uterus with the biopsy device (rare). Allergic reactions to medicines. What happens before the procedure? Keep a record of your menstrual cycles as told by your health care provider. You may need to schedule your procedure for a specific time in your cycle. You may want to bring a sanitary pad to wear after the procedure. Plan to have someone take you home from the hospital or clinic. Ask your health care provider about: Changing or stopping your regular medicines. This is especially important if you are taking diabetes medicines, arthritis medicines, or blood thinners. PLEASE TAKE IBUPROFEN AND TYLENOL ONE HOUR PRIOR TO APPOINTMENT Taking over-the-counter medicines, vitamins, herbs, and supplements. What happens during the procedure? You will lie on an exam table with your feet and legs supported as in a pelvic exam. Your health care  provider will insert an instrument (speculum) into your vagina to see your cervix. Your cervix will be cleansed with an antiseptic solution. A medicine (local anesthetic) will be used to numb the cervix. A forceps instrument (tenaculum) will be used to hold your cervix steady for the biopsy. A thin, rod-like instrument (uterine sound) will be inserted through your cervix to determine the length of your uterus and the location where the biopsy sample will be removed. A thin, flexible tube (catheter) will be inserted through your cervix and into the uterus. The catheter will be used to collect the biopsy sample from your endometrial tissue. The catheter and speculum will then be removed, and the tissue sample will be sent to a lab for examination. The procedure may vary among health care providers and hospitals. What can I expect after procedure? You will rest in a recovery area until you are ready to go home. You may have mild cramping and a small amount of vaginal bleeding. This is normal. You may have a small amount of vaginal bleeding for a few days. This is normal. It is up to you to get the results of your procedure. Ask your health care provider, or the department that is doing the procedure, when your results will be ready. Follow these instructions at home: Take over-the-counter and prescription medicines only as told by your health care provider. Do not douche, use tampons, or have sexual intercourse until your health care provider approves. Return to your normal activities as told by your health care provider. Ask your health care provider what activities are safe for you. Follow instructions from your health care provider about any activity restrictions, such as restrictions on strenuous exercise or heavy  lifting. Keep all follow-up visits. This is important. Contact a health care provider: You have heavy bleeding, or bleed for longer than 2 days after the procedure. You have bad  smelling discharge from your vagina. You have a fever or chills. You have a burning sensation when urinating or you have difficulty urinating. You have severe pain in your lower abdomen. Get help right away if you: You have severe cramps in your stomach or back. You pass large blood clots. Your bleeding increases. You become weak or light-headed, or you faint or lose consciousness. Summary An endometrial biopsy is a procedure to remove tissue samples is taken from the endometrium, which is the lining of the uterus. The tissue sample that is removed will be checked under a microscope for disease. This procedure is used to diagnose conditions such as endometrial cancer, endometrial tuberculosis, polyps, or other inflammatory conditions. After the procedure, it is common to have mild cramping and a small amount of vaginal bleeding for a few days. Do not douche, use tampons, or have sexual intercourse until your health care provider approves. Ask your health care provider which activities are safe for you. This information is not intended to replace advice given to you by your health care provider. Make sure you discuss any questions you have with your health care provider. Document Revised: 08/18/2021 Document Reviewed: 04/15/2020 Elsevier Patient Education  [employer].

## 2022-03-29 ENCOUNTER — Encounter: Payer: Self-pay | Admitting: Obstetrics & Gynecology

## 2022-04-02 ENCOUNTER — Encounter: Payer: Self-pay | Admitting: Obstetrics & Gynecology

## 2022-04-02 ENCOUNTER — Ambulatory Visit
Admission: RE | Admit: 2022-04-02 | Discharge: 2022-04-02 | Disposition: A | Discharge status: Home / Self Care | Payer: BC Managed Care – PPO | Source: Ambulatory Visit | Attending: Obstetrics & Gynecology | Admitting: Obstetrics & Gynecology

## 2022-04-02 DIAGNOSIS — N924 Excessive bleeding in the premenopausal period: Secondary | ICD-10-CM | POA: Diagnosis present

## 2022-04-06 ENCOUNTER — Other Ambulatory Visit: Payer: Self-pay | Admitting: *Deleted

## 2022-04-06 MED ORDER — LIDOCAINE HCL URETHRAL/MUCOSAL 2 % EX GEL: 1.0000 | CUTANEOUS | 2 refills | Status: DC | PRN

## 2022-04-14 ENCOUNTER — Encounter: Payer: Self-pay | Admitting: Obstetrics & Gynecology

## 2022-04-14 ENCOUNTER — Other Ambulatory Visit (HOSPITAL_COMMUNITY)
Admission: RE | Admit: 2022-04-14 | Discharge: 2022-04-14 | Disposition: A | Discharge status: Home / Self Care | Payer: BC Managed Care – PPO | Source: Ambulatory Visit | Attending: Obstetrics & Gynecology | Admitting: Obstetrics & Gynecology

## 2022-04-14 ENCOUNTER — Ambulatory Visit (INDEPENDENT_AMBULATORY_CARE_PROVIDER_SITE_OTHER): Payer: BC Managed Care – PPO | Admitting: Obstetrics & Gynecology

## 2022-04-14 VITALS — BP 104/69 | HR 80 | Wt 173.0 lb

## 2022-04-14 DIAGNOSIS — N882 Stricture and stenosis of cervix uteri: Secondary | ICD-10-CM

## 2022-04-14 DIAGNOSIS — Z124 Encounter for screening for malignant neoplasm of cervix: Secondary | ICD-10-CM | POA: Diagnosis not present

## 2022-04-14 DIAGNOSIS — N924 Excessive bleeding in the premenopausal period: Secondary | ICD-10-CM | POA: Diagnosis present

## 2022-04-14 DIAGNOSIS — Z538 Procedure and treatment not carried out for other reasons: Secondary | ICD-10-CM

## 2022-04-14 DIAGNOSIS — Z01812 Encounter for preprocedural laboratory examination: Secondary | ICD-10-CM

## 2022-04-14 LAB — POCT URINE PREGNANCY: Preg Test, Ur: NEGATIVE

## 2022-04-14 MED ORDER — MISOPROSTOL 200 MCG PO TABS: ORAL_TABLET | ORAL | 2 refills | Status: DC

## 2022-04-14 NOTE — Patient Instructions (Signed)
ENDOMETRIAL BIOPSY FAILED ATTEMPT POST-PROCEDURE INSTRUCTIONS  You may take Ibuprofen, Aleve or Tylenol for pain if needed.  Cramping should resolve within in 24 hours.  You may have a small amount of spotting.  You should wear a mini pad for the next few days.  You may have intercourse after 24 hours.  You need to call if you have any pelvic pain, fever, heavy bleeding or foul smelling vaginal discharge.  Shower or bathe as normal  6. Please use the misoprostol as directed prior to next appointment/next attempt.

## 2022-04-14 NOTE — Progress Notes (Signed)
GYNECOLOGY OFFICE VISIT NOTE  History:   Debra Edwards is a 46 y.o. F here for pap smear and endometrial biopsy for abnormal perimenopausal bleeding. She was started on Megace 40 mg daily at last visit, she said the bleeding stopped after three days. She is very happy with this response.  She denies any abnormal vaginal discharge, bleeding, pelvic pain or other concerns.    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   Health Maintenance:  Normal pap and negative HRHPV on 11/07/2012.  Normal mammogram on 04/22/2021.   Review of Systems:  Pertinent items noted in HPI and remainder of comprehensive ROS otherwise negative.  Physical Exam:  BP 104/69   Pulse 80   Wt 173 lb (78.5 kg)   LMP  (Within Months)   BMI 31.14 kg/m  CONSTITUTIONAL: Well-developed, well-nourished female in no acute distress.  HEENT:  Normocephalic, atraumatic. External right and left ear normal. No scleral icterus.  NECK: Normal range of motion, supple, no masses noted on observation SKIN: No rash  noted. Not diaphoretic. No erythema. No pallor. MUSCULOSKELETAL: Normal range of motion. No edema noted. NEUROLOGIC: Alert and oriented to person, place, and time. Normal muscle tone coordination. No cranial nerve deficit noted. PSYCHIATRIC: Normal mood and affect. Normal behavior. Normal judgment and thought content. CARDIOVASCULAR: Normal heart rate noted RESPIRATORY: Effort and breath sounds normal, no problems with respiration noted ABDOMEN: No masses noted. No other overt distention noted.   PELVIC: Normal appearing external genitalia; normal urethral meatus. Introitus sprayed with Hurricane spray prior to speculum insertion. Normal appearing vaginal mucosa and cervix. Pap smear done  No abnormal discharge noted.  Normal uterine size, no other palpable masses, no uterine or adnexal tenderness. Performed in the presence of a chaperone  ENDOMETRIAL BIOPSY ATTEMPT (unsuccessful due to cervical os stenosis)    The indications for endometrial biopsy were reviewed.   Risks of the biopsy including cramping, bleeding, infection, uterine perforation, inadequate specimen and need for additional procedures  were discussed. The patient states she understands and agrees to undergo procedure today. Consent was signed. Time out was performed. Urine HCG was negative. Chaperone was present during entire procedure. During the pelvic exam and after the pap, the cervix was prepped with Betadine. A paracervical block with 20 ml of 1% lidocaine was administered. A single-toothed tenaculum was placed on the anterior lip of the cervix to stabilize it. The  3 mm pipelle was introduced into the cervix ut only got to a dept of about 3.5 cm. Internal os stenosis was noted. Unable to be breached using multiple plastic and metal dilators, I aborted further attempts as I was concerned about creating a false passage.   The instruments were removed from the patient's vagina. Minimal bleeding from the cervix was noted. The patient  tolerated the procedure attempt well.  Routine post-procedure instructions were given to the patient.     Labs and Imaging Results for orders placed or performed in visit on 04/14/22 (from the past 168 hour(s))  POCT urine pregnancy   Collection Time: 04/14/22  2:37 PM  Result Value Ref Range   Preg Test, Ur Negative Negative   US PELVIC COMPLETE WITH TRANSVAGINAL  Result Date: 04/02/2022 CLINICAL DATA:  Initial evaluation for abnormal uterine bleeding, Peri menopausal. EXAM: TRANSABDOMINAL AND TRANSVAGINAL ULTRASOUND OF PELVIS TECHNIQUE: Both transabdominal and transvaginal ultrasound examinations of the pelvis were performed. Transabdominal technique was performed for global imaging of the pelvis including uterus, ovaries, adnexal regions, and pelvic cul-de-sac. It was necessary to proceed with endovaginal exam following the transabdominal exam to visualize the endometrium. COMPARISON:  None Available. FINDINGS: Uterus Measurements: 7.7 x 4.3 x 5.4 cm = volume: 92.0 mL. Uterus is anteverted. No discrete fibroid or other myometrial abnormality. Endometrium Thickness: 5 mm.  No focal abnormality visualized. Right ovary Measurements: 1.5 x 1.4 x 1.4 cm = volume: 1.6 mL. Normal appearance/no adnexal mass. Left ovary Measurements: 2.4 x 1.6 x 1.2 cm = volume: 2.2 mL. Normal appearance/no adnexal mass. Other findings No abnormal free fluid. IMPRESSION: 1. Normal pelvic ultrasound. 2. Endometrial stripe measures up to 5 mm in thickness. If bleeding remains unresponsive to hormonal or medical therapy, sonohysterogram should be considered for focal lesion work-up. (Ref: Radiological Reasoning: Algorithmic Workup of Abnormal Vaginal Bleeding with Endovaginal Sonography and Sonohysterography. AJR 2008; 299:M42-68). Electronically Signed   By: Jeannine Boga M.D.   On: 04/02/2022 18:58      Assessment and Plan:     1. Abnormal perimenopausal bleeding Reassured by 5 mm endometrial stripe but patient told  that tissue sampling is recommended for complete evaluation of AUB.  Also reassured by response to oral progestin therapy, discussed possibility of progestin IUD to bridge into menopause. Will continue oral progestin for now. - Cytology - PAP  2. Failed attempted endometrial biopsy 3. Cervical os stenosis Offered repeat sampling after misoprostol here in the office vs in the OR, she wants to try here. Will re-attempt. - misoprostol (CYTOTEC) 200 MCG tablet; Place two tablets in the vagina the night prior to your next clinic appointment  Dispense: 2 tablet; Refill: 2   Routine preventative health maintenance measures emphasized. Please refer to After Visit Summary for other counseling recommendations.   Return in about 4 weeks (around 05/12/2022) for Second attempt for endometrial biopsy after misoprostol, needs paracervical block.    I spent 30 minutes dedicated to the care of this patient including pre-visit review of records, face to face time with the patient discussing her conditions and treatments and post visit orders.    [name], MD, [place] for [facility], [place]

## 2022-04-23 ENCOUNTER — Ambulatory Visit
Admission: RE | Admit: 2022-04-23 | Discharge: 2022-04-23 | Disposition: A | Discharge status: Home / Self Care | Payer: BC Managed Care – PPO | Source: Ambulatory Visit | Attending: Family Medicine | Admitting: Family Medicine

## 2022-04-23 DIAGNOSIS — Z1231 Encounter for screening mammogram for malignant neoplasm of breast: Secondary | ICD-10-CM | POA: Insufficient documentation

## 2022-04-23 LAB — CYTOLOGY - PAP
Comment: NEGATIVE
Diagnosis: UNDETERMINED — AB
High risk HPV: NEGATIVE

## 2022-05-12 ENCOUNTER — Ambulatory Visit: Payer: BC Managed Care – PPO | Admitting: Obstetrics & Gynecology

## 2022-05-12 ENCOUNTER — Other Ambulatory Visit (HOSPITAL_COMMUNITY)
Admission: RE | Admit: 2022-05-12 | Discharge: 2022-05-12 | Disposition: A | Discharge status: Home / Self Care | Payer: BC Managed Care – PPO | Source: Ambulatory Visit | Attending: Obstetrics & Gynecology | Admitting: Obstetrics & Gynecology

## 2022-05-12 ENCOUNTER — Encounter: Payer: Self-pay | Admitting: Obstetrics & Gynecology

## 2022-05-12 VITALS — BP 103/67 | HR 76 | Wt 173.0 lb

## 2022-05-12 DIAGNOSIS — N924 Excessive bleeding in the premenopausal period: Secondary | ICD-10-CM | POA: Diagnosis not present

## 2022-05-12 DIAGNOSIS — N882 Stricture and stenosis of cervix uteri: Secondary | ICD-10-CM

## 2022-05-12 NOTE — Progress Notes (Signed)
      GYNECOLOGY OFFICE PROCEDURE NOTE   Debra Edwards is a 46 y.o. F here for repeat attempt of endometrial biopsy for abnormal perimenopausal bleeding. She premedicated with vaginal misoprostol.  Recent ultrasound on 04/02/2022 showed 5 mm endometrial stripe.  Today, she reports no concerning symptoms. Of note, pap on 04/14/22 was ASCUS, negative HPV.   ENDOMETRIAL BIOPSY     The indications for endometrial biopsy were reviewed.   Risks of the biopsy including cramping, bleeding, infection, uterine perforation, inadequate specimen and need for additional procedures were discussed. Offered alternative of hysteroscopy, dilation and curettage in OR. The patient states she understands the R/B/I/A and agrees to undergo procedure today. Urine pregnancy test was negative. Consent was signed. Time out was performed. Chaperone was present during entire procedure.   Patient was positioned in dorsal lithotomy position. A vaginal speculum was placed after introitus was sprayed with Hurricane spray, also had to spray vagina with Hurricane spray due to patient's discomfort.  The cervix was visualized and was prepped with Betadine.   A paracervical block with 10 ml of 2% lidocaine with epinephrine was administered. A single-toothed tenaculum was placed on the anterior lip of the cervix to stabilize it. The 3 mm pipelle was introduced into the cervix only got to a depth of about 4 cm. Using a plastic dilator, was able to get to a depth of 7.5 cm.  The 3 mm pipelle was  then re-introduced into the endometrial cavity without difficulty to a depth of 7.5 cm, and a moderate amount of tissue was obtained after two passes and sent to pathology. The instruments were removed from the patient's vagina. Minimal bleeding from the cervix was noted. The patient tolerated the procedure well.   Patient was given post procedure instructions.  Will follow up pathology and manage accordingly; patient will be contacted with  results and recommendations.  Routine preventative health maintenance measures emphasized.      [name], MD, [place] for [facility], [place]

## 2022-05-12 NOTE — Patient Instructions (Signed)
ENDOMETRIAL BIOPSY POST-PROCEDURE INSTRUCTIONS  You may take Ibuprofen, Aleve or Tylenol for pain if needed.  Cramping should resolve within in 24 hours.  You may have a small amount of spotting.  You should wear a mini pad for the next few days.  You may have intercourse after 24 hours.  You need to call if you have any pelvic pain, fever, heavy bleeding or foul smelling vaginal discharge.  Shower or bathe as normal  6. We will call you within one week with results

## 2022-05-13 ENCOUNTER — Encounter: Payer: Self-pay | Admitting: Obstetrics & Gynecology

## 2022-05-13 LAB — SURGICAL PATHOLOGY

## 2022-06-12 ENCOUNTER — Encounter: Payer: Self-pay | Admitting: Family Medicine

## 2022-07-26 ENCOUNTER — Other Ambulatory Visit: Payer: Self-pay | Admitting: Family Medicine

## 2022-08-03 ENCOUNTER — Ambulatory Visit (INDEPENDENT_AMBULATORY_CARE_PROVIDER_SITE_OTHER): Payer: BC Managed Care – PPO | Admitting: Family Medicine

## 2022-08-03 ENCOUNTER — Encounter: Payer: Self-pay | Admitting: Family Medicine

## 2022-08-03 VITALS — BP 130/82 | HR 79 | Temp 98.1°F | Ht 62.5 in | Wt 173.0 lb

## 2022-08-03 DIAGNOSIS — K219 Gastro-esophageal reflux disease without esophagitis: Secondary | ICD-10-CM | POA: Diagnosis not present

## 2022-08-03 DIAGNOSIS — F4310 Post-traumatic stress disorder, unspecified: Secondary | ICD-10-CM

## 2022-08-03 DIAGNOSIS — Z1211 Encounter for screening for malignant neoplasm of colon: Secondary | ICD-10-CM | POA: Diagnosis not present

## 2022-08-03 DIAGNOSIS — Z0000 Encounter for general adult medical examination without abnormal findings: Secondary | ICD-10-CM | POA: Diagnosis not present

## 2022-08-03 DIAGNOSIS — L989 Disorder of the skin and subcutaneous tissue, unspecified: Secondary | ICD-10-CM | POA: Diagnosis not present

## 2022-08-03 DIAGNOSIS — N946 Dysmenorrhea, unspecified: Secondary | ICD-10-CM

## 2022-08-03 DIAGNOSIS — K589 Irritable bowel syndrome without diarrhea: Secondary | ICD-10-CM

## 2022-08-03 MED ORDER — HYOSCYAMINE SULFATE 0.125 MG PO TABS
0.1250 mg | ORAL_TABLET | Freq: Four times a day (QID) | ORAL | 4 refills | Status: DC | PRN
Start: 2022-08-03 — End: 2023-08-16

## 2022-08-03 NOTE — Assessment & Plan Note (Signed)
Pt declines colonoscopy , ifob or cologuard  May re consider at age 46  Will discuss yearly

## 2022-08-03 NOTE — Assessment & Plan Note (Signed)
Small brown macule/slt raised on L lower eyelid  Derm referral done

## 2022-08-03 NOTE — Assessment & Plan Note (Signed)
Refilled hyoscyamine which she uses infrequently   miralax prn constipation with good fluids

## 2022-08-03 NOTE — Progress Notes (Signed)
Subjective:    Patient ID: Debra Edwards, female    DOB: [birth year], 46 y.o.   MRN: [id number]  HPI Here for health maintenance exam and to review chronic medical problems    Wt Readings from Last 3 Encounters:  08/03/22 173 lb (78.5 kg)  05/12/22 173 lb (78.5 kg)  04/14/22 173 lb (78.5 kg)   31.14 kg/m  Doing well overall  A lot of anxiety to deal with  Was walking 4 d per week with neighbor   Has some exercise equipment in her house   Had to have some gyn procedures   Has a spot on eyelid /lower left  Opt noted it  ? Freckle   Uses sun protection /is good about it  Wears hat/stays in shade    Immunization History  Administered Date(s) Administered   Influenza Inj Mdck Quad Pf 06/03/2018   Influenza Split 07/10/2011   Influenza Whole 06/07/2009, 06/16/2010   Influenza, Seasonal, Injecte, Preservative Fre 08/13/2015, 06/07/2021   Influenza,inj,Quad PF,6+ Mos 07/08/2016, 06/17/2017, 06/23/2019   Influenza-Unspecified 07/06/2014, 07/03/2020   Moderna Sars-Covid-2 Vaccination 12/02/2019, 12/30/2019, 07/26/2020, 03/15/2021   Td 03/05/2006, 12/03/2010   Tdap 12/14/2020   Health Maintenance Due  Topic Date Due   HIV Screening  Never done   Hepatitis C Screening  Never done   COLONOSCOPY (Pts 45-24yr Insurance coverage will need to be confirmed)  Never done   COVID-19 Vaccine (5 - Moderna series) 05/10/2021   INFLUENZA VACCINE  05/05/2022   Colon cancer screening : wants to put off colonoscopy until 49  No family history  Declines any screening incl ifob or cologuard   Flu shot -had this fall [employer] covid booster   Mammogram 04/2022 Self breast exam: no lumps or changes   Pap 04/2022 ascus with neg HPV  Has a 3 y recall  Gyn sees Dr AHarolyn Rutherford Continuous OC  Megestrol for endomet hyperplasia   Mental health  Under care of psychiatry  More anxiety lately  Has therapist also    BP Readings from Last 3 Encounters:  08/03/22 130/82  05/12/22  103/67  04/14/22 104/69   Pulse Readings from Last 3 Encounters:  08/03/22 79  05/12/22 76  04/14/22 80    GERD Takes prn pepcid  (does not need often)  Doing pretty well   H/o IBS - uses miralax  Drinks lots of water  Needs a refill of hyoxcyamine - uses infrequently as needed for pain   Patient Active Problem List   Diagnosis Date Noted   Skin lesion 08/03/2022   Perimenopause 01/26/2022   Colon cancer screening 07/30/2021   Hamstring injury 04/28/2021   Mild reactive airways disease 05/15/2019   Motion sickness 04/20/2018   Spasm of thoracic back muscle 08/11/2016   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   Routine general medical examination at a health care facility 10/27/2012   Migraine headache 06/24/2012   Stress reaction 03/16/2012   GERD (gastroesophageal reflux disease) 02/19/2012   IBS (irritable bowel syndrome) 04/14/2011   OBSTRUCTIVE SLEEP APNEA 09/05/2010   BRUXISM 07/24/2010   RESTLESS LEG SYNDROME 07/24/2010   ALLERGIC RHINITIS 12/17/2009   PTSD 10/20/2007   FIBROCYSTIC BREAST DISEASE 10/20/2007   DYSMENORRHEA 10/20/2007   Past Medical History:  Diagnosis Date   Acid reflux    occasional   Allergic rhinitis, seasonal    vasomotor rhinitis   Anemia in the past   is a blood donor   Anxiety    Constipation, chronic  Depression    Dysmenorrhea    GERD (gastroesophageal reflux disease)    Heart murmur    Negative echo in the past   History of chicken pox    IBS (irritable bowel syndrome)    Migraines    OSA (obstructive sleep apnea)    PSG 08/20/10 RDI 18.7   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 bact inf   Irritable bowel syndrome Mother    Hypertension Father        Not in contact with   Allergies Father    Heart disease Father    Diabetes Maternal Grandfather    Cancer Paternal Grandfather        unsure type   Anxiety disorder Other        diffusely in family   Depression Other        diffusely in family   Colon cancer Neg Hx    Breast cancer Neg Hx    No Known Allergies Current Outpatient Medications on File Prior to Visit  Medication Sig Dispense Refill   acetaminophen (TYLENOL) 500 MG tablet Take 500 mg by mouth every 6 (six) hours as needed. For headache     BALZIVA 0.4-35 MG-MCG tablet TAKE 1 TABLET BY MOUTH DAILY. TAKE CONTINUOUSLY (Patient taking differently: TAKE 1 TABLET BY MOUTH DAILY. TAKE CONTINUOUSLY (skip placebo weekl)) 112 tablet 3   BELSOMRA 10 MG TABS Take 1 tablet by mouth at bedtime.     busPIRone (BUSPAR) 15 MG tablet Take 1 tablet by mouth in the morning and at bedtime.     clonazePAM (KLONOPIN) 0.5 MG tablet Take 0.5 mg by mouth at bedtime.      desvenlafaxine (PRISTIQ) 100 MG 24 hr tablet Take 100 mg by mouth every morning.     famotidine (PEPCID) 20 MG tablet Take 20 mg by mouth daily as needed for heartburn or indigestion.     fluticasone (FLONASE) 50 MCG/ACT nasal spray SPRAY 2 SPRAYS INTO EACH NOSTRIL EVERY DAY 48 mL 3   megestrol (MEGACE) 40 MG tablet Take 1 tablet (40 mg total) by mouth daily. Can increase to two tablets a day in the event of heavy bleeding 60 tablet 5   Promethazine HCl (PHENERGAN PO) Take by mouth daily as needed (nausea and vomiting).     No current facility-administered medications on file prior to visit.    Review of Systems  Constitutional:  Positive for fatigue. Negative  for activity change, appetite change, fever and unexpected weight change.  HENT:  Negative for congestion, ear pain, rhinorrhea, sinus pressure and sore throat.   Eyes:  Negative for pain, redness and visual disturbance.  Respiratory:  Negative for cough, shortness of breath and wheezing.   Cardiovascular:  Negative for chest pain  and palpitations.  Gastrointestinal:  Negative for abdominal pain, blood in stool, constipation and diarrhea.  Endocrine: Negative for polydipsia and polyuria.  Genitourinary:  Positive for menstrual problem. Negative for dysuria, frequency and urgency.  Musculoskeletal:  Negative for arthralgias, back pain and myalgias.  Skin:  Negative for pallor and rash.  Allergic/Immunologic: Negative for environmental allergies.  Neurological:  Negative for dizziness, syncope and headaches.  Hematological:  Negative for adenopathy. Does not bruise/bleed easily.  Psychiatric/Behavioral:  Negative for decreased concentration and dysphoric mood. The patient is nervous/anxious.        Objective:   Physical Exam Constitutional:      General: She is not in acute distress.    Appearance: Normal appearance. She is well-developed. She is obese. She is not ill-appearing or diaphoretic.  HENT:     Head: Normocephalic and atraumatic.     Right Ear: Tympanic membrane, ear canal and external ear normal.     Left Ear: Tympanic membrane, ear canal and external ear normal.     Nose: Nose normal. No congestion.     Mouth/Throat:     Mouth: Mucous membranes are moist.     Pharynx: Oropharynx is clear. No posterior oropharyngeal erythema.  Eyes:     General: No scleral icterus.    Extraocular Movements: Extraocular movements intact.     Conjunctiva/sclera: Conjunctivae normal.     Pupils: Pupils are equal, round, and reactive to light.  Neck:     Thyroid: No thyromegaly.     Vascular: No carotid bruit or JVD.  Cardiovascular:     Rate and Rhythm: Normal rate and regular rhythm.      Pulses: Normal pulses.     Heart sounds: Normal heart sounds.     No gallop.  Pulmonary:     Effort: Pulmonary effort is normal. No respiratory distress.     Breath sounds: Normal breath sounds. No wheezing.     Comments: Good air exch Chest:     Chest wall: No tenderness.  Abdominal:     General: Bowel sounds are normal. There is no distension or abdominal bruit.     Palpations: Abdomen is soft. There is no mass.     Tenderness: There is no abdominal tenderness.     Hernia: No hernia is present.  Genitourinary:    Comments: Breast and pelvic exam done by gyn   Musculoskeletal:        General: No tenderness. Normal range of motion.     Cervical back: Normal range of motion and neck supple. No rigidity. No muscular tenderness.     Right lower leg: No edema.     Left lower leg: No edema.     Comments: No kyphosis   Lymphadenopathy:     Cervical: No cervical adenopathy.  Skin:    General: Skin is warm and dry.     Coloration: Skin is not pale.     Findings: No erythema or rash.  Neurological:     Mental Status: She is alert. Mental status is at baseline.     Cranial Nerves: No cranial nerve deficit.     Motor: No abnormal muscle tone.     Coordination: Coordination normal.     Gait: Gait normal.     Deep Tendon Reflexes: Reflexes are normal and symmetric. Reflexes normal.  Psychiatric:        Mood and Affect: Mood normal.        Cognition and Memory: Cognition and memory normal.  Assessment & Plan:   Problem List Items Addressed This Visit       Digestive   GERD (gastroesophageal reflux disease)    Doing better  Takes pepcid only if needed       Relevant Medications   hyoscyamine (LEVSIN) 0.125 MG tablet   IBS (irritable bowel syndrome)    Refilled hyoscyamine which she uses infrequently   miralax prn constipation with good fluids       Relevant Medications   hyoscyamine (LEVSIN) 0.125 MG tablet     Musculoskeletal and Integument   Skin  lesion    Small brown macule/slt raised on L lower eyelid  Derm referral done       Relevant Orders   Ambulatory referral to Dermatology     Genitourinary   DYSMENORRHEA    Did have endo bx with gyn- per pt no malignant change On OC continuous May consider IUD later         Other   Colon cancer screening    Pt declines colonoscopy , ifob or cologuard  May re consider at age 30  Will discuss yearly      PTSD    With more anxiety recently  Continues psychiatric care and counseling Overall coping well      Relevant Medications   busPIRone (BUSPAR) 15 MG tablet   desvenlafaxine (PRISTIQ) 100 MG 24 hr tablet   Routine general medical examination at a health care facility - Primary    Reviewed health habits including diet and exercise and skin cancer prevention Reviewed appropriate screening tests for age  Also reviewed health mt list, fam hx and immunization status , as well as social and family history   See HPI Labs ordered  Under gyn care  imms utd  Mammogram utd 04/2022 Pap utd 04/2022  Enc more regular exercise       Relevant Orders   TSH   Lipid panel   Comprehensive metabolic panel   CBC with Differential/Platelet

## 2022-08-03 NOTE — Assessment & Plan Note (Signed)
Did have endo bx with gyn- per pt no malignant change On OC continuous May consider IUD later

## 2022-08-03 NOTE — Assessment & Plan Note (Addendum)
Reviewed health habits including diet and exercise and skin cancer prevention Reviewed appropriate screening tests for age  Also reviewed health mt list, fam hx and immunization status , as well as social and family history   See HPI Labs ordered  Under gyn care  imms utd  Mammogram utd 04/2022 Pap utd 04/2022  Enc more regular exercise

## 2022-08-03 NOTE — Patient Instructions (Addendum)
Think about  a new plan for exercise  Use your equipment if you cannot get out or are not motivated to walk outdoors  Strength training is great also  Exercise helps anxiety significantly   Continue your mental health care    I put a referral for dermatology If you don't hear in 1-2 weeks let us know   Take care of yourself   Make sure to drink lots of water

## 2022-08-03 NOTE — Assessment & Plan Note (Signed)
Doing better  Takes pepcid only if needed

## 2022-08-03 NOTE — Assessment & Plan Note (Signed)
With more anxiety recently  Continues psychiatric care and counseling Overall coping well

## 2022-08-04 LAB — LIPID PANEL
Cholesterol: 156 mg/dL (ref 0–200)
HDL: 64.9 mg/dL (ref >39.00)
LDL Cholesterol: 66 mg/dL (ref 0–99)
NonHDL: 90.97
Total CHOL/HDL Ratio: 2
Triglycerides: 126 mg/dL (ref 0.0–149.0)
VLDL: 25.2 mg/dL (ref 0.0–40.0)

## 2022-08-04 LAB — CBC WITH DIFFERENTIAL/PLATELET
Basophils Absolute: 0 10*3/uL (ref 0.0–0.1)
Basophils Relative: 0.8 % (ref 0.0–3.0)
Eosinophils Absolute: 0.1 10*3/uL (ref 0.0–0.7)
Eosinophils Relative: 1.2 % (ref 0.0–5.0)
HCT: 37.9 % (ref 36.0–46.0)
Hemoglobin: 13 g/dL (ref 12.0–15.0)
Lymphocytes Relative: 39.1 % (ref 12.0–46.0)
Lymphs Abs: 2.1 10*3/uL (ref 0.7–4.0)
MCHC: 34.4 g/dL (ref 30.0–36.0)
MCV: 87.5 fl (ref 78.0–100.0)
Monocytes Absolute: 0.3 10*3/uL (ref 0.1–1.0)
Monocytes Relative: 6.4 % (ref 3.0–12.0)
Neutro Abs: 2.8 10*3/uL (ref 1.4–7.7)
Neutrophils Relative %: 52.5 % (ref 43.0–77.0)
Platelets: 244 10*3/uL (ref 150.0–400.0)
RBC: 4.34 Mil/uL (ref 3.87–5.11)
RDW: 13.3 % (ref 11.5–15.5)
WBC: 5.3 10*3/uL (ref 4.0–10.5)

## 2022-08-04 LAB — COMPREHENSIVE METABOLIC PANEL
ALT: 9 U/L (ref 0–35)
AST: 9 U/L (ref 0–37)
Albumin: 3.9 g/dL (ref 3.5–5.2)
Alkaline Phosphatase: 41 U/L (ref 39–117)
BUN: 12 mg/dL (ref 6–23)
CO2: 27 mEq/L (ref 19–32)
Calcium: 8.9 mg/dL (ref 8.4–10.5)
Chloride: 103 mEq/L (ref 96–112)
Creatinine, Ser: 0.78 mg/dL (ref 0.40–1.20)
GFR: 90.76 mL/min (ref >60.00)
Glucose, Bld: 94 mg/dL (ref 70–99)
Potassium: 3.8 mEq/L (ref 3.5–5.1)
Sodium: 137 mEq/L (ref 135–145)
Total Bilirubin: 0.3 mg/dL (ref 0.2–1.2)
Total Protein: 6.4 g/dL (ref 6.0–8.3)

## 2022-08-04 LAB — TSH: TSH: 5.48 u[IU]/mL (ref 0.35–5.50)

## 2022-08-12 ENCOUNTER — Ambulatory Visit
Admission: RE | Admit: 2022-08-12 | Discharge: 2022-08-12 | Disposition: A | Discharge status: Home / Self Care | Payer: BC Managed Care – PPO | Source: Ambulatory Visit | Attending: Emergency Medicine | Admitting: Emergency Medicine

## 2022-08-12 VITALS — BP 126/82 | HR 84 | Temp 98.1°F | Resp 18 | Ht 62.5 in | Wt 173.0 lb

## 2022-08-12 DIAGNOSIS — R059 Cough, unspecified: Secondary | ICD-10-CM | POA: Insufficient documentation

## 2022-08-12 DIAGNOSIS — B349 Viral infection, unspecified: Secondary | ICD-10-CM | POA: Insufficient documentation

## 2022-08-12 DIAGNOSIS — R051 Acute cough: Secondary | ICD-10-CM | POA: Insufficient documentation

## 2022-08-12 DIAGNOSIS — Z79899 Other long term (current) drug therapy: Secondary | ICD-10-CM | POA: Insufficient documentation

## 2022-08-12 DIAGNOSIS — Z1152 Encounter for screening for COVID-19: Secondary | ICD-10-CM | POA: Insufficient documentation

## 2022-08-12 LAB — RESP PANEL BY RT-PCR (FLU A&B, COVID) ARPGX2
Influenza A by PCR: NEGATIVE
Influenza B by PCR: NEGATIVE
SARS Coronavirus 2 by RT PCR: NEGATIVE

## 2022-08-12 MED ORDER — BENZONATATE 100 MG PO CAPS: 100.0000 mg | ORAL_CAPSULE | Freq: Three times a day (TID) | ORAL | 0 refills | Status: DC | PRN

## 2022-08-12 NOTE — Discharge Instructions (Addendum)
Your COVID and Flu tests are pending.    Take the [facility] as directed for cough.  Take Tylenol or ibuprofen as needed for fever or discomfort.  Rest and keep yourself hydrated.    Follow-up with your primary care provider if your symptoms are not improving.

## 2022-08-12 NOTE — ED Provider Notes (Signed)
Roderic Palau    CSN: [id number] Arrival date & time: 08/12/22  1611      History   Chief Complaint Chief Complaint  Patient presents with   Cough    I woke up this morning with an ugly sounding cough and runny nose.  I want to make sure I take care of myself properly. - Entered by patient    HPI Debra Edwards is a 46 y.o. female. Patient presents with congestion and cough today.  No fever, rash, shortness of breath, vomiting, diarrhea, or other symptoms.  Negative COVID test at home.  Treating symptoms with Mucinex and Theraflu.  Patient is a [employer] examiner.   The history is provided by the patient and medical records.    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    possibly from [place] farm   PTSD (post-traumatic stress disorder)    with depressive symptoms   Urticaria    stress induced    Patient Active Problem List   Diagnosis Date Noted   Cough 08/12/2022   Skin lesion 08/03/2022   Perimenopause 01/26/2022   Colon cancer screening 07/30/2021   Hamstring injury 04/28/2021   Mild reactive airways disease 05/15/2019   Motion sickness 04/20/2018   Spasm of thoracic back muscle 08/11/2016   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      Home Medications    Prior to Admission medications   Medication Sig Start Date End Date Taking? Authorizing Provider  benzonatate (TESSALON) 100 MG capsule Take 1 capsule (100 mg total) by mouth 3 (three) times daily as needed for cough. 08/12/22  Yes Sharion Balloon, NP  acetaminophen (TYLENOL) 500 MG tablet Take 500 mg by mouth every 6 (six) hours as needed. For headache    [provider]  BALZIVA 0.4-35 MG-MCG tablet TAKE 1 TABLET BY MOUTH DAILY. TAKE CONTINUOUSLY Patient taking differently: TAKE 1 TABLET BY MOUTH DAILY. TAKE CONTINUOUSLY (skip placebo weekl) 09/04/21   Tower, Wynelle Fanny, MD  BELSOMRA 10 MG TABS Take 1 tablet by mouth at bedtime. 05/15/21   [provider]  busPIRone (BUSPAR) 15 MG tablet Take 1 tablet by mouth in the morning and at bedtime. 06/03/22   [provider]  clonazePAM (KLONOPIN) 0.5 MG tablet Take 0.5  mg by mouth at bedtime.     [provider]  desvenlafaxine (PRISTIQ) 100 MG 24 hr tablet Take 100 mg by mouth every morning. 07/19/22   [provider]  famotidine (PEPCID) 20 MG tablet Take 20 mg by mouth daily as needed for heartburn or indigestion.    [provider]  fluticasone (FLONASE) 50 MCG/ACT nasal spray SPRAY 2 SPRAYS INTO EACH NOSTRIL EVERY DAY 03/25/22   Tower, Wynelle Fanny, MD  hyoscyamine (LEVSIN) 0.125 MG tablet Take 1 tablet (0.125 mg total) by mouth every 6 (six) hours as needed for cramping. 08/03/22   Tower, Wynelle Fanny, MD  megestrol (MEGACE) 40 MG tablet Take 1 tablet (40 mg total) by mouth daily. Can increase to two tablets a day in the event of heavy bleeding 03/24/22   Anyanwu,  Sallyanne Havers, MD  Promethazine HCl (PHENERGAN PO) Take by mouth daily as needed (nausea and vomiting).    [provider]    Family History Family History  Problem Relation Age of Onset   Other Mother        Digestive problems-[facility]/u @ [facility]-? bact inf   Irritable bowel syndrome Mother    Hypertension Father        Not in contact with   Allergies Father    Heart disease Father    Diabetes Maternal Grandfather    Cancer Paternal Grandfather        unsure type   Anxiety disorder Other        diffusely in family   Depression Other        diffusely in family   Colon cancer Neg Hx    Breast cancer Neg Hx     Social History Social History   Tobacco Use   Smoking status: Never   Smokeless tobacco: Never  Vaping Use   Vaping Use: Never used  Substance Use Topics   Alcohol use: Yes    Alcohol/week: 0.0 standard drinks of alcohol    Comment: Rare   Drug use: No     Allergies   Patient has no known allergies.   Review of Systems Review of Systems  Constitutional:  Negative for chills and fever.  HENT:  Positive for congestion. Negative for ear pain and sore throat.   Respiratory:  Positive for cough. Negative for shortness of breath.   Cardiovascular:  Negative for chest pain and palpitations.  Gastrointestinal:  Negative for diarrhea and vomiting.  Skin:  Negative for rash.  All other systems reviewed and are negative.    Physical Exam Triage Vital Signs ED Triage Vitals  Enc Vitals Group     BP      Pulse      Resp      Temp      Temp src      SpO2      Weight      Height      Head Circumference      Peak Flow      Pain Score      Pain Loc      Pain Edu?      Excl. in [place]?    No data found.  Updated Vital Signs BP 126/82   Pulse 84   Temp 98.1 F (36.7 C)   Resp 18   Ht 5' 2.5" (1.588 m)   Wt 173 lb (78.5 kg)   LMP  (LMP Unknown) Comment: Perimenopause- last period June  SpO2 97%   BMI 31.14 kg/m  Visual Acuity Right Eye  Distance:   Left Eye Distance:   Bilateral Distance:    Right Eye Near:   Left Eye Near:    Bilateral Near:     Physical Exam Vitals and nursing note reviewed.  Constitutional:      General: She is not in acute distress.    Appearance: Normal appearance. She is well-developed. She is not ill-appearing.  HENT:     Right Ear: Tympanic membrane normal.     Left Ear: Tympanic membrane normal.     Nose: Rhinorrhea present.     Mouth/Throat:     Mouth: Mucous membranes are moist.     Pharynx: Oropharynx is clear.  Cardiovascular:     Rate and Rhythm: Normal rate and regular rhythm.     Heart sounds: Normal heart sounds.  Pulmonary:     Effort: Pulmonary effort is normal. No respiratory distress.     Breath sounds: Normal breath sounds.  Musculoskeletal:     Cervical back: Neck supple.  Skin:    General: Skin is warm and dry.  Neurological:     Mental Status: She is alert.  Psychiatric:        Mood and Affect: Mood normal.        Behavior: Behavior normal.      UC Treatments / Results  Labs (all labs ordered are listed, but only abnormal results are displayed) Labs Reviewed  SARS CORONAVIRUS 2 (TAT 6-24 HRS)  RESP PANEL BY RT-PCR (FLU A&B, COVID) [id number]    EKG   Radiology No results found.  Procedures Procedures (including critical care time)  Medications Ordered in UC Medications - No data to display  Initial Impression / Assessment and Plan / UC Course  I have reviewed the triage vital signs and the nursing notes.  Pertinent labs & imaging results that were available during my care of the patient were reviewed by me and considered in my medical decision making (see chart for details).    Cough, viral illness.  COVID and Flu pending.  Treating cough with Tessalon Perles.  Discussed symptomatic treatment including Tylenol or ibuprofen, rest, hydration.  Instructed patient to follow up with PCP if symptoms are not improving.  She agrees to plan of  care.   Final Clinical Impressions(s) / UC Diagnoses   Final diagnoses:  Acute cough  Viral illness     Discharge Instructions      Your COVID and Flu tests are pending.    Take the [facility] as directed for cough.  Take Tylenol or ibuprofen as needed for fever or discomfort.  Rest and keep yourself hydrated.    Follow-up with your primary care provider if your symptoms are not improving.         ED Prescriptions     Medication Sig Dispense Auth. Provider   benzonatate (TESSALON) 100 MG capsule Take 1 capsule (100 mg total) by mouth 3 (three) times daily as needed for cough. 21 capsule Sharion Balloon, NP      PDMP not reviewed this encounter.   Sharion Balloon, NP 08/12/22 1710

## 2022-08-12 NOTE — ED Triage Notes (Signed)
Patient to Urgent Care with complaints of cough and chest congestion that started today. Denies any known fevers. Cough is wet sounding and productive.   Negative covid test this morning at home.  Has been taking theraflu and mucinex. Pushing fluids.

## 2022-08-14 ENCOUNTER — Telehealth: Payer: BC Managed Care – PPO | Admitting: Emergency Medicine

## 2022-08-14 DIAGNOSIS — J069 Acute upper respiratory infection, unspecified: Secondary | ICD-10-CM

## 2022-08-14 MED ORDER — ALBUTEROL SULFATE HFA 108 (90 BASE) MCG/ACT IN AERS: 2.0000 | INHALATION_SPRAY | Freq: Four times a day (QID) | RESPIRATORY_TRACT | 0 refills | Status: AC | PRN

## 2022-08-14 NOTE — Progress Notes (Signed)
Virtual Visit Consent   Debra Edwards, you are scheduled for a virtual visit with a [facility] provider today. Just as with appointments in the office, your consent must be obtained to participate. Your consent will be active for this visit and any virtual visit you may have with one of our providers in the next 365 days. If you have a MyChart account, a copy of this consent can be sent to you electronically.  As this is a virtual visit, video technology does not allow for your provider to perform a traditional examination. This may limit your provider's ability to fully assess your condition. If your provider identifies any concerns that need to be evaluated in person or the need to arrange testing (such as labs, EKG, etc.), we will make arrangements to do so. Although advances in technology are sophisticated, we cannot ensure that it will always work on either your end or our end. If the connection with a video visit is poor, the visit may have to be switched to a telephone visit. With either a video or telephone visit, we are not always able to ensure that we have a secure connection.  By engaging in this virtual visit, you consent to the provision of healthcare and authorize for your insurance to be billed (if applicable) for the services provided during this visit. Depending on your insurance coverage, you may receive a charge related to this service.  I need to obtain your verbal consent now. Are you willing to proceed with your visit today? Debra Edwards has provided verbal consent on 08/14/2022 for a virtual visit (video or telephone). Debra Getting, NP  Date: 08/14/2022 12:01 PM  Virtual Visit via Video Note   I, Debra Edwards, connected with  Debra Edwards  ([id number], 17-Dec-1975) on 08/14/22 at 11:45 AM EST by a video-enabled telemedicine application and verified that I am speaking with the correct person using two identifiers. Interactive audio  and video communications were attempted, although failed due to patient's inability to connect to video. Continued visit with audio only interaction with patient agreement.  Location: Patient: Virtual Visit Location Patient: Home Provider: Virtual Visit Location Provider: Home Office   I discussed the limitations of evaluation and management by telemedicine and the availability of in person appointments. The patient expressed understanding and agreed to proceed.    History of Present Illness: Debra Edwards is a 46 y.o. who identifies as a female who was assigned female at birth, and is being seen today for uri.  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  She felt worse later that day and went to urgent care.  Test results from urgent care showed she was negative for COVID and negative for influenza AMB.  Yesterday she felt worse with a temperature of around 100, chills, nasal congestion and rhinorrhea, postnasal drainage, but her chest inflammation and cough feel better.  She took Tylenol this morning and approximately 2 hours later her temperature was still 100 F.  She denies shortness of breath or wheezing.  She wonders if she could have influenza despite negative testing.  Onset of symptoms was gradual not abrupt.  Has been taking plain Mucinex and TheraFlu to help manage symptoms.  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   Routine general medical examination at a health care facility 10/27/2012   Migraine headache  06/24/2012   Stress reaction 03/16/2012   GERD (gastroesophageal reflux disease) 02/19/2012   IBS (irritable bowel syndrome) 04/14/2011   OBSTRUCTIVE SLEEP APNEA 09/05/2010   BRUXISM 07/24/2010   RESTLESS LEG SYNDROME 07/24/2010   ALLERGIC RHINITIS 12/17/2009   PTSD 10/20/2007   FIBROCYSTIC BREAST DISEASE 10/20/2007   DYSMENORRHEA 10/20/2007    Allergies: No Known Allergies Medications:  Current Outpatient Medications:    acetaminophen (TYLENOL) 500 MG tablet, Take 500 mg by mouth every 6 (six) hours as needed. For headache, Disp: , Rfl:    BALZIVA 0.4-35 MG-MCG tablet, TAKE 1 TABLET BY MOUTH DAILY. TAKE CONTINUOUSLY (Patient taking differently: TAKE 1 TABLET BY MOUTH DAILY. TAKE CONTINUOUSLY (skip placebo weekl)), Disp: 112 tablet, Rfl: 3   BELSOMRA 10 MG TABS, Take 1 tablet by mouth at bedtime., Disp: , Rfl:    benzonatate (TESSALON) 100 MG capsule, Take 1 capsule (100 mg total) by mouth 3 (three) times daily as needed for cough., Disp: 21 capsule, Rfl: 0   busPIRone (BUSPAR) 15 MG tablet, Take 1 tablet by mouth in the morning and at bedtime., Disp: , Rfl:    clonazePAM (KLONOPIN) 0.5 MG tablet, Take 0.5 mg by mouth at bedtime. , Disp: , Rfl:    desvenlafaxine (PRISTIQ) 100 MG 24 hr tablet, Take 100 mg by mouth every morning., Disp: , Rfl:    famotidine (PEPCID) 20 MG tablet, Take 20 mg by mouth daily as needed for heartburn or indigestion., Disp: , Rfl:    fluticasone (FLONASE) 50 MCG/ACT nasal spray, SPRAY 2 SPRAYS INTO EACH NOSTRIL EVERY DAY, Disp: 48 mL, Rfl: 3   hyoscyamine (LEVSIN) 0.125 MG tablet, Take 1 tablet (0.125 mg total) by mouth every 6 (six) hours as needed for cramping., Disp: 30 tablet, Rfl: 4   megestrol (MEGACE) 40 MG tablet, Take 1 tablet (40 mg total) by mouth daily. Can increase to two tablets a day in the event of heavy bleeding, Disp: 60 tablet, Rfl: 5   Promethazine HCl (PHENERGAN PO), Take by mouth daily as needed (nausea and vomiting)., Disp: , Rfl:    Observations/Objective: Patient is in no acute distress.  Resting comfortably  at home.  No labored breathing.  Occasional cough overheard on audio Patient sounds audibly congested. Speech is clear and coherent with logical content.  Patient is alert and oriented at baseline.    Assessment and Plan: 1. Upper respiratory tract infection, unspecified type  Although she has no wheezing or shortness of breath at this time, patient is worried she may develop some.  This happened to her back in March/April 2020 when she is presumed to have had COVID.  She would like an albuterol inhaler prescribed and I have done so.  Reviewed supportive care measures including continuing plain Mucinex and TheraFlu pleat.  I also advised her to start using nasal saline.  Discussed reasons for seeking a higher level of care.  Follow Up Instructions: I discussed the assessment and treatment plan with the patient. The patient was provided an opportunity to ask questions and all were answered. The patient agreed with the plan and demonstrated an  understanding of the instructions.  A copy of instructions were sent to the patient via MyChart unless otherwise noted below.   The patient was advised to call back or seek an in-person evaluation if the symptoms worsen or if the condition fails to improve as anticipated.  Time:  I spent 16 minutes with the patient via telehealth technology discussing the above problems/concerns.    Debra Getting, NP

## 2022-08-14 NOTE — Patient Instructions (Signed)
Debra Edwards, thank you for joining Carvel Getting, NP for today's virtual visit.  While this provider is not your primary care provider (PCP), if your PCP is located in our provider database this encounter information will be shared with them immediately following your visit.   [facility] account gives you access to today's visit and all your visits, tests, and labs performed at [facility] " click here if you don't have a [name] account or go to [email or web address]  Consent: (Patient) Debra Edwards provided verbal consent for this virtual visit at the beginning of the encounter.  Current Medications:  Current Outpatient Medications:    albuterol (VENTOLIN HFA) 108 (90 Base) MCG/ACT inhaler, Inhale 2 puffs into the lungs every 6 (six) hours as needed for wheezing or shortness of breath., Disp: 8 g, Rfl: 0   acetaminophen (TYLENOL) 500 MG tablet, Take 500 mg by mouth every 6 (six) hours as needed. For headache, Disp: , Rfl:    BALZIVA 0.4-35 MG-MCG tablet, TAKE 1 TABLET BY MOUTH DAILY. TAKE CONTINUOUSLY (Patient taking differently: TAKE 1 TABLET BY MOUTH DAILY. TAKE CONTINUOUSLY (skip placebo weekl)), Disp: 112 tablet, Rfl: 3   BELSOMRA 10 MG TABS, Take 1 tablet by mouth at bedtime., Disp: , Rfl:    benzonatate (TESSALON) 100 MG capsule, Take 1 capsule (100 mg total) by mouth 3 (three) times daily as needed for cough., Disp: 21 capsule, Rfl: 0   busPIRone (BUSPAR) 15 MG tablet, Take 1 tablet by mouth in the morning and at bedtime., Disp: , Rfl:    clonazePAM (KLONOPIN) 0.5 MG tablet, Take 0.5 mg by mouth at bedtime. , Disp: , Rfl:    desvenlafaxine (PRISTIQ) 100 MG 24 hr tablet, Take 100 mg by mouth every morning., Disp: , Rfl:    famotidine (PEPCID) 20 MG tablet, Take 20 mg by mouth daily as needed for heartburn or indigestion., Disp: , Rfl:    fluticasone (FLONASE) 50 MCG/ACT nasal spray, SPRAY 2 SPRAYS INTO EACH NOSTRIL  EVERY DAY, Disp: 48 mL, Rfl: 3   hyoscyamine (LEVSIN) 0.125 MG tablet, Take 1 tablet (0.125 mg total) by mouth every 6 (six) hours as needed for cramping., Disp: 30 tablet, Rfl: 4   megestrol (MEGACE) 40 MG tablet, Take 1 tablet (40 mg total) by mouth daily. Can increase to two tablets a day in the event of heavy bleeding, Disp: 60 tablet, Rfl: 5   Promethazine HCl (PHENERGAN PO), Take by mouth daily as needed (nausea and vomiting)., Disp: , Rfl:    Medications ordered in this encounter:  Meds ordered this encounter  Medications   albuterol (VENTOLIN HFA) 108 (90 Base) MCG/ACT inhaler    Sig: Inhale 2 puffs into the lungs every 6 (six) hours as needed for wheezing or shortness of breath.    Dispense:  8 g    Refill:  0     *If you need refills on other medications prior to your next appointment, please contact your pharmacy*  Follow-Up: Call back or seek an in-person evaluation if the symptoms worsen or if the condition fails to improve as anticipated.  [place] [phone]  Other Instructions Continue using Mucinex and TheraFlu to help manage her symptoms.  I also recommend using nasal saline several times a day, either saline irrigation like a neti pot or saline spray.   Try using saline irrigation, such as with a neti pot, several times a day while you are sick.  Many neti pots come with salt packets premeasured to use to make saline. If you use your own salt, make sure it is kosher salt or sea salt (don't use table salt as it has iodine in it and you don't need that in your nose). Use distilled water to make saline. If you mix your own saline using your own salt, the recipe is 1/4 teaspoon salt in 1 cup warm water. Using saline irrigation can help prevent and treat sinus infections.  Use albuterol inhaler if you feel short of breath or have wheezing.  If you have severe shortness of breath and the inhaler is not helping, please seek care in an emergency room.   If  you have been instructed to have an in-person evaluation today at a local Urgent Care facility, please use the link below. It will take you to a list of all of our available [facility], including address, phone number and hours of operation. Please do not delay care.  [facility]  If you or a family member do not have a primary care provider, use the link below to schedule a visit and establish care. When you choose a  primary care physician or advanced practice provider, you gain a long-term partner in health. Find a Primary Care Provider  Learn more about 's in-office and virtual care options: [facility]

## 2022-08-18 ENCOUNTER — Ambulatory Visit: Payer: BC Managed Care – PPO | Admitting: Family Medicine

## 2022-08-18 ENCOUNTER — Ambulatory Visit (INDEPENDENT_AMBULATORY_CARE_PROVIDER_SITE_OTHER): Payer: BC Managed Care – PPO | Admitting: Nurse Practitioner

## 2022-08-18 ENCOUNTER — Ambulatory Visit: Admit: 2022-08-18 | Disposition: A | Discharge status: Home / Self Care | Payer: BC Managed Care – PPO

## 2022-08-18 VITALS — BP 110/64 | HR 81 | Temp 98.6°F | Resp 16 | Ht 62.5 in | Wt 171.1 lb

## 2022-08-18 DIAGNOSIS — J0100 Acute maxillary sinusitis, unspecified: Secondary | ICD-10-CM | POA: Insufficient documentation

## 2022-08-18 MED ORDER — AMOXICILLIN-POT CLAVULANATE 875-125 MG PO TABS: 1.0000 | ORAL_TABLET | Freq: Two times a day (BID) | ORAL | 0 refills | Status: AC

## 2022-08-18 NOTE — Progress Notes (Signed)
Acute Office Visit  Subjective:     Patient ID: Debra Edwards, female    DOB: 06-15-1976, 46 y.o.   MRN: [id number]  Chief Complaint  Patient presents with   Cough    Sx started 08/12/22-went to UC and was given Tessalon perles-test for Flu and Covid were negative, did VV 08/14/22 gave Albuterol inhaler. Had fever towards the beginning of the symptoms. Symptoms lingering chest congestion, sinus congestion, cough, fatigue/malaise. She feels slight improvement but not much. Has not had to use inhaler so far.     Patient is in today for Cough  Symptoms started on 08/12/2022. 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 Covid vaccines Flu Flu and Covid text negative at UC. Was given some tessalon Perles that seems to help. She was then evaluated at a virtual visit she was given an albuterol inhaler. States that she has not used the inhaler. She has used over the counter cold and flu, tylenol and ibuprofen   States that she has improved slightly. Review of Systems  Constitutional:  Positive for malaise/fatigue. Negative for chills and fever.       Appetite decreased Fluid intake has increased   HENT:  Positive for sinus pain. Negative for ear discharge, ear pain and sore throat (improved).   Respiratory:  Positive for cough and sputum production. Negative for shortness of breath and wheezing.   Musculoskeletal:  Positive for joint pain (improved) and myalgias (improved).  Neurological:  Positive for headaches.        Objective:    BP 110/64   Pulse 81   Temp 98.6 F (37 C)   Resp 16   Ht 5' 2.5" (1.588 m)   Wt 171 lb 2 oz (77.6 kg)   LMP  (LMP Unknown) Comment: Perimenopause- last period June  SpO2 98%   BMI 30.80 kg/m    Physical Exam Vitals and nursing note reviewed.  Constitutional:      Appearance: Normal appearance.  HENT:     Right Ear: Tympanic membrane, ear canal and external ear normal.      Left Ear: Tympanic membrane, ear canal and external ear normal.     Nose:     Right Sinus: Maxillary sinus tenderness present. No frontal sinus tenderness.     Left Sinus: Maxillary sinus tenderness present. No frontal sinus tenderness.     Mouth/Throat:     Mouth: Mucous membranes are moist.     Pharynx: Oropharynx is clear.  Cardiovascular:     Rate and Rhythm: Normal rate and regular rhythm.     Heart sounds: Normal heart sounds.  Pulmonary:     Effort: Pulmonary effort is normal.     Breath sounds: Normal breath sounds.  Lymphadenopathy:     Cervical: Cervical adenopathy present.  Neurological:     Mental Status: She is alert.     No results found for any visits on 08/18/22.      Assessment & Plan:   Problem List Items Addressed This Visit       Respiratory   Acute non-recurrent maxillary sinusitis - Primary    Wasp prescription given at discharge.  Patient is wait and see if she starts improving some tomorrow if not encourage patient to get antibiotic filled.  Work is also given to patient.  Follow-up if no improvement.      Relevant Medications   amoxicillin-clavulanate (AUGMENTIN) 875-125 MG tablet  Meds ordered this encounter  Medications   amoxicillin-clavulanate (AUGMENTIN) 875-125 MG tablet    Sig: Take 1 tablet by mouth 2 (two) times daily for 7 days.    Dispense:  14 tablet    Refill:  0    Order Specific Question:   Supervising Provider    Answer:   TOWER, MARNE A [[id number]    Return if symptoms worsen or fail to improve.  Romilda Garret, NP

## 2022-08-18 NOTE — Patient Instructions (Signed)
Nice to see you today If you are not improving by tomorrow get the antibiotic filled Start back on the flonase

## 2022-08-18 NOTE — Assessment & Plan Note (Signed)
Wasp prescription given at discharge.  Patient is wait and see if she starts improving some tomorrow if not encourage patient to get antibiotic filled.  Work is also given to patient.  Follow-up if no improvement.

## 2022-09-09 ENCOUNTER — Ambulatory Visit (INDEPENDENT_AMBULATORY_CARE_PROVIDER_SITE_OTHER): Payer: BC Managed Care – PPO | Admitting: Family Medicine

## 2022-09-09 ENCOUNTER — Other Ambulatory Visit
Admission: RE | Admit: 2022-09-09 | Discharge: 2022-09-09 | Disposition: A | Discharge status: Home / Self Care | Payer: BC Managed Care – PPO | Source: Ambulatory Visit | Attending: Family Medicine | Admitting: Family Medicine

## 2022-09-09 ENCOUNTER — Telehealth: Payer: Self-pay | Admitting: *Deleted

## 2022-09-09 ENCOUNTER — Encounter: Payer: Self-pay | Admitting: Family Medicine

## 2022-09-09 VITALS — BP 120/78 | HR 101 | Temp 98.8°F | Ht 62.0 in | Wt 169.6 lb

## 2022-09-09 DIAGNOSIS — A498 Other bacterial infections of unspecified site: Secondary | ICD-10-CM

## 2022-09-09 DIAGNOSIS — K589 Irritable bowel syndrome without diarrhea: Secondary | ICD-10-CM | POA: Diagnosis not present

## 2022-09-09 DIAGNOSIS — R197 Diarrhea, unspecified: Secondary | ICD-10-CM | POA: Insufficient documentation

## 2022-09-09 DIAGNOSIS — R110 Nausea: Secondary | ICD-10-CM | POA: Diagnosis not present

## 2022-09-09 DIAGNOSIS — K580 Irritable bowel syndrome with diarrhea: Secondary | ICD-10-CM

## 2022-09-09 LAB — C DIFFICILE QUICK SCREEN W PCR REFLEX
C Diff antigen: POSITIVE — AB
C Diff interpretation: DETECTED
C Diff toxin: POSITIVE — AB

## 2022-09-09 MED ORDER — VANCOMYCIN HCL 125 MG PO CAPS: 125.0000 mg | ORAL_CAPSULE | Freq: Four times a day (QID) | ORAL | 0 refills | Status: AC

## 2022-09-09 MED ORDER — DICYCLOMINE HCL 20 MG PO TABS: ORAL_TABLET | ORAL | 0 refills | Status: DC

## 2022-09-09 MED ORDER — CULTURELLE PROBIOTICS + MULTIV PO CHEW: CHEWABLE_TABLET | ORAL | 1 refills | Status: AC

## 2022-09-09 MED ORDER — ONDANSETRON HCL 4 MG PO TABS: 4.0000 mg | ORAL_TABLET | Freq: Three times a day (TID) | ORAL | 0 refills | Status: DC | PRN

## 2022-09-09 NOTE — Progress Notes (Signed)
   SUBJECTIVE:   Chief Complaint  Patient presents with   Acute Visit    Stomach issues/ covid exposed    HPI  Diarrhea: Patient complains of diarrhea. Onset of diarrhea was about a month ago. Diarrhea is occurring approximately several times per day. Patient describes diarrhea as having unusual odor and flaky . Diarrhea has been associated with abdominal pain described as cramping, recent antibiotic therapy 11/14-24 for acute sinusitis, and nausea .  Patient denies blood in stool, fever, illness in household contacts, recent travel, significant abdominal pain.   PERTINENT PMH / PSH: IBS  OBJECTIVE:  BP 120/78   Pulse (!) 101   Temp 98.8 F (37.1 C) (Oral)   Ht '5\' 2"'$  (1.575 m)   Wt 169 lb 9.6 oz (76.9 kg)   LMP  (LMP Unknown) Comment: Perimenopause- last period June  SpO2 99%   BMI 31.02 kg/m    Physical Exam Vitals reviewed.  Constitutional:      General: She is not in acute distress.    Appearance: She is not ill-appearing.  Cardiovascular:     Rate and Rhythm: Normal rate.  Pulmonary:     Effort: Pulmonary effort is normal.  Abdominal:     General: Bowel sounds are normal. There is no distension or abdominal bruit.     Palpations: Abdomen is soft. There is no fluid wave, hepatomegaly or pulsatile mass.     Tenderness: There is abdominal tenderness (epigastric). There is no right CVA tenderness, left CVA tenderness, guarding or rebound. Negative signs include Murphy's sign and McBurney's sign.     Hernia: No hernia is present.  Skin:    Coloration: Skin is not jaundiced.  Neurological:     Mental Status: She is alert. Mental status is at baseline.  Psychiatric:        Mood and Affect: Mood normal.        Behavior: Behavior normal.        Thought Content: Thought content normal.        Judgment: Judgment normal.     ASSESSMENT/PLAN:  Clostridium difficile infection -     Vancomycin HCl; Take 1 capsule (125 mg total) by mouth 4 (four) times daily for 10 days.   Dispense: 40 capsule; Refill: 0 -     Culturelle Probiotics + Multiv; take  Dispense: 30 tablet; Refill: 1  Nausea Assessment & Plan: Zofran 4 mg daily Has prescription for Phenergan   Orders: -     Lipase  Acute diarrhea -     Basic metabolic panel -     CBC with Differential/Platelet -     C Difficile Quick Screen w PCR reflex; Future  Irritable bowel syndrome, unspecified type Assessment & Plan: Refill Bentyl    Follow up at 545pm Called patient with results of recent stool sample, positive for C.Diff.  All questions answered.  Discussed hygiene, medication and note provided for work.  Can follow up with PCP in 1 week if not improved.  PDMP reviewed  Return if symptoms worsen or fail to improve, for PCP.  Carollee Leitz, MD

## 2022-09-09 NOTE — Patient Instructions (Addendum)
It was a pleasure meeting you today. Thank you for allowing me to take part in your health care.  Our goals for today as we discussed include:  Take Zofran 4 mg as needed for nausea. Take Bentyl 20 mg 4 times a day as needed for abdominal cramping  Will get some labs today and stool sample  If any fevers, bloody diarrhea, worsening abdominal pain please go to ED for evaluation.  If you have any questions or concerns, please do not hesitate to call the office at [phone].  I look forward to our next visit and until then take care and stay safe.  Regards,   Carollee Leitz, MD   [facility]

## 2022-09-09 NOTE — Telephone Encounter (Signed)
CRITICAL VALUE STICKER  CRITICAL VALUE: Positive C-diff (antigen & antibody)  RECEIVER (on-site recipient of call):  DATE & TIME NOTIFIED: 09/09/22 @ 4:53pm  MESSENGER (representative from lab):[facility]  MD NOTIFIED: Dr. Volanda Napoleon  TIME OF NOTIFICATION: 4:53pm  RESPONSE:

## 2022-09-09 NOTE — Telephone Encounter (Signed)
Patient notified and treated

## 2022-09-10 ENCOUNTER — Ambulatory Visit: Payer: BC Managed Care – PPO | Admitting: Family Medicine

## 2022-09-10 ENCOUNTER — Encounter: Payer: Self-pay | Admitting: Family Medicine

## 2022-09-10 LAB — BASIC METABOLIC PANEL
BUN: 6 mg/dL (ref 6–23)
CO2: 25 mEq/L (ref 19–32)
Calcium: 8.1 mg/dL — ABNORMAL LOW (ref 8.4–10.5)
Chloride: 106 mEq/L (ref 96–112)
Creatinine, Ser: 0.88 mg/dL (ref 0.40–1.20)
GFR: 78.47 mL/min (ref >60.00)
Glucose, Bld: 75 mg/dL (ref 70–99)
Potassium: 3.7 mEq/L (ref 3.5–5.1)
Sodium: 139 mEq/L (ref 135–145)

## 2022-09-10 LAB — CBC WITH DIFFERENTIAL/PLATELET
Basophils Absolute: 0.1 10*3/uL (ref 0.0–0.1)
Basophils Relative: 1.6 % (ref 0.0–3.0)
Eosinophils Absolute: 0.1 10*3/uL (ref 0.0–0.7)
Eosinophils Relative: 1.5 % (ref 0.0–5.0)
HCT: 40.8 % (ref 36.0–46.0)
Hemoglobin: 13.7 g/dL (ref 12.0–15.0)
Lymphocytes Relative: 29.2 % (ref 12.0–46.0)
Lymphs Abs: 2 10*3/uL (ref 0.7–4.0)
MCHC: 33.5 g/dL (ref 30.0–36.0)
MCV: 88.7 fl (ref 78.0–100.0)
Monocytes Absolute: 0.6 10*3/uL (ref 0.1–1.0)
Monocytes Relative: 9.2 % (ref 3.0–12.0)
Neutro Abs: 4 10*3/uL (ref 1.4–7.7)
Neutrophils Relative %: 58.5 % (ref 43.0–77.0)
Platelets: 278 10*3/uL (ref 150.0–400.0)
RBC: 4.6 Mil/uL (ref 3.87–5.11)
RDW: 13 % (ref 11.5–15.5)
WBC: 6.8 10*3/uL (ref 4.0–10.5)

## 2022-09-10 LAB — LIPASE: Lipase: 35 U/L (ref 11.0–59.0)

## 2022-09-11 ENCOUNTER — Telehealth: Payer: Self-pay

## 2022-09-11 ENCOUNTER — Telehealth: Payer: Self-pay | Admitting: Family Medicine

## 2022-09-11 MED ORDER — PROMETHAZINE HCL 25 MG PO TABS: 25.0000 mg | ORAL_TABLET | Freq: Three times a day (TID) | ORAL | 0 refills | Status: AC | PRN

## 2022-09-11 MED ORDER — ONDANSETRON HCL 8 MG PO TABS: 8.0000 mg | ORAL_TABLET | Freq: Three times a day (TID) | ORAL | 0 refills | Status: AC | PRN

## 2022-09-11 NOTE — Telephone Encounter (Signed)
Called Patient and per Dr. Glori Bickers called in some Phernagren for the weekend and some 8 mg Zofran from next week when she goes to work and she is scheduled for a follow up on 09/21/22 with Dr. Glori Bickers

## 2022-09-11 NOTE — Telephone Encounter (Signed)
I think Dr Volanda Napoleon gave her zofran is this right?   Is she taking it? Does it help?  Any vomiting ? Thanks

## 2022-09-11 NOTE — Telephone Encounter (Signed)
Pt called stating she saw Volanda Napoleon for stomach issues and she was given antibiotics and it is making her nausea. Pt want to know if there is any suggestions the doctor can give her to help with the nausea

## 2022-09-11 NOTE — Telephone Encounter (Signed)
I sent phenergan to try for weekend with caution of sedation   I sent in higher  dose zofran 8 mg pill to take if that does not work or for days she has to avoid more sedation   Thanks  Schedule f/u with me for c diff Keep [place] posted

## 2022-09-14 NOTE — Telephone Encounter (Signed)
Pt said meds helped and f/u scheduled for 09/16/22

## 2022-09-14 NOTE — Telephone Encounter (Signed)
Noted  

## 2022-09-16 ENCOUNTER — Other Ambulatory Visit: Payer: Self-pay | Admitting: Family Medicine

## 2022-09-16 ENCOUNTER — Ambulatory Visit (INDEPENDENT_AMBULATORY_CARE_PROVIDER_SITE_OTHER): Payer: BC Managed Care – PPO | Admitting: Family Medicine

## 2022-09-16 ENCOUNTER — Encounter: Payer: Self-pay | Admitting: Family Medicine

## 2022-09-16 VITALS — BP 130/72 | HR 89 | Temp 97.6°F | Ht 62.5 in | Wt 175.0 lb

## 2022-09-16 DIAGNOSIS — R0981 Nasal congestion: Secondary | ICD-10-CM

## 2022-09-16 DIAGNOSIS — A0472 Enterocolitis due to Clostridium difficile, not specified as recurrent: Secondary | ICD-10-CM | POA: Diagnosis not present

## 2022-09-16 DIAGNOSIS — Z8619 Personal history of other infectious and parasitic diseases: Secondary | ICD-10-CM | POA: Insufficient documentation

## 2022-09-16 DIAGNOSIS — J069 Acute upper respiratory infection, unspecified: Secondary | ICD-10-CM

## 2022-09-16 DIAGNOSIS — K580 Irritable bowel syndrome with diarrhea: Secondary | ICD-10-CM

## 2022-09-16 LAB — POC COVID19 BINAXNOW: SARS Coronavirus 2 Ag: NEGATIVE

## 2022-09-16 LAB — POCT INFLUENZA A/B
Influenza A, POC: NEGATIVE
Influenza B, POC: NEGATIVE

## 2022-09-16 NOTE — Assessment & Plan Note (Signed)
Negative covid and flu testing  Reassuring exam Disc symptomatic care Rest and fluids  ER parameters discussed Will monitor

## 2022-09-16 NOTE — Patient Instructions (Addendum)
Finish the vancomycin  Take nausea medicine as needed   Stay hydrated! As much as you can   Eat bland diet  Hold off on the bran   Treat your cold symptoms  Rest when you can  Watch for fever

## 2022-09-16 NOTE — Progress Notes (Signed)
Subjective:    Patient ID: Debra Edwards, female    DOB: 04/23/1976, 46 y.o.   MRN: [id number]  HPI Pt presents for f/u of c diff Also uri symptoms     Pt was seen by Dr Volanda Napoleon on 12/6  Tested pos for c diff (with acute diarrhea)- after a course of augmentin for sinusitis  Was tx with vancomycin 125 mg daily four times daily for 10 days  Given zofran by us,followed by phenergan  Also adv probiotics   Results for orders placed or performed during the hospital encounter of 09/09/22  C Difficile Quick Screen w PCR reflex   Specimen: STOOL  Result Value Ref Range   C Diff antigen POSITIVE (A) NEGATIVE   C Diff toxin POSITIVE (A) NEGATIVE   C Diff interpretation Toxin producing C. difficile detected.     Lab Results  Component Value Date   CREATININE 0.88 09/09/2022   BUN 6 09/09/2022   NA 139 09/09/2022   K 3.7 09/09/2022   CL 106 09/09/2022   CO2 25 09/09/2022   Lab Results  Component Value Date   WBC 6.8 09/09/2022   HGB 13.7 09/09/2022   HCT 40.8 09/09/2022   MCV 88.7 09/09/2022   PLT 278.0 09/09/2022   Lab Results  Component Value Date   LIPASE 35.0 09/09/2022     Cramping and the diarrhea went away very soon after started treatment   Stools are still looser than normal but way better than they were  Will finish abx sat/sund   Still taking nausea medicine  Is keeping up with fluids Today she was able to drink faster than just sipping   Neg covid and flu tests today  Cold symptoms  Little runny nose  Sinus fullness  Not a lot of cough   Results for orders placed or performed in visit on 09/16/22  POC COVID-19  Result Value Ref Range   SARS Coronavirus 2 Ag Negative Negative  POCT Influenza A/B  Result Value Ref Range   Influenza A, POC Negative Negative   Influenza B, POC Negative Negative    Patient Active Problem List   Diagnosis Date Noted   C. 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   Routine general medical examination at a health care facility 10/27/2012   Migraine headache 06/24/2012   Stress reaction 03/16/2012   GERD (gastroesophageal reflux disease) 02/19/2012   IBS (irritable bowel syndrome) 04/14/2011   OBSTRUCTIVE SLEEP APNEA 09/05/2010   BRUXISM 07/24/2010   RESTLESS LEG SYNDROME 07/24/2010   ALLERGIC RHINITIS 12/17/2009   PTSD 10/20/2007   FIBROCYSTIC BREAST DISEASE 10/20/2007   DYSMENORRHEA 10/20/2007   Past Medical History:  Diagnosis Date   Acid reflux    occasional   Allergic rhinitis, seasonal    vasomotor rhinitis   Anemia in the past   is a blood donor   Anxiety    Constipation, chronic    Depression    Dysmenorrhea    GERD (gastroesophageal reflux disease)    Heart murmur    Negative echo in the past   History of chicken pox    IBS (  irritable bowel syndrome)    Migraines    OSA (obstructive sleep apnea)    PSG 08/20/10 RDI 18.7   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 bact inf   Irritable bowel syndrome Mother     Hypertension Father        Not in contact with   Allergies Father    Heart disease Father    Diabetes Maternal Grandfather    Cancer Paternal Grandfather        unsure type   Anxiety disorder Other        diffusely in family   Depression Other        diffusely in family   Colon cancer Neg Hx    Breast cancer Neg Hx    No Known Allergies Current Outpatient Medications on File Prior to Visit  Medication Sig Dispense Refill   acetaminophen (TYLENOL) 500 MG tablet Take 500 mg by mouth every 6 (six) hours as needed. For headache     albuterol (VENTOLIN HFA) 108 (90 Base) MCG/ACT inhaler Inhale 2 puffs into the lungs every 6 (six) hours as needed for wheezing or shortness of breath. 8 g 0   BALZIVA 0.4-35 MG-MCG tablet TAKE 1 TABLET BY MOUTH DAILY. TAKE CONTINUOUSLY (Patient taking differently: TAKE 1 TABLET BY MOUTH DAILY. TAKE CONTINUOUSLY (skip placebo weekl)) 112 tablet 3   BELSOMRA 10 MG TABS Take 1 tablet by mouth at bedtime.     benzonatate (TESSALON) 100 MG capsule Take 1 capsule (100 mg total) by mouth 3 (three) times daily as needed for cough. 21 capsule 0   busPIRone (BUSPAR) 30 MG tablet Take 30 mg by mouth 2 (two) times daily.     clonazePAM (KLONOPIN) 0.5 MG tablet Take 0.5 mg by mouth at bedtime.      desvenlafaxine (PRISTIQ) 50 MG 24 hr tablet Take 50 mg by mouth daily.     dicyclomine (BENTYL) 20 MG tablet Take 1 tablet 4 times a day as needed for abdominal pain 60 tablet 0   famotidine (PEPCID) 20 MG tablet Take 20 mg by mouth daily as needed for heartburn or indigestion.     fluticasone (FLONASE) 50 MCG/ACT nasal spray SPRAY 2 SPRAYS INTO EACH NOSTRIL EVERY DAY 48 mL 3   hyoscyamine (LEVSIN) 0.125 MG tablet Take 1 tablet (0.125 mg total) by mouth every 6 (six) hours as needed for cramping. 30 tablet 4   megestrol (MEGACE) 40 MG tablet Take 1 tablet (40 mg total) by mouth daily. Can increase to two tablets a day in the event of heavy bleeding 60 tablet 5   Multiple  Vitamins-Minerals (CULTURELLE PROBIOTICS + MULTIV) CHEW take 30 tablet 1   ondansetron (ZOFRAN) 8 MG tablet Take 1 tablet (8 mg total) by mouth  every 8 (eight) hours as needed for nausea or vomiting (if phenergan does not work). 20 tablet 0   promethazine (PHENERGAN) 25 MG tablet Take 1 tablet (25 mg total) by mouth every 8 (eight) hours as needed for nausea or vomiting (if zofran does not work). 20 tablet 0   vancomycin (VANCOCIN) 125 MG capsule Take 1 capsule (125 mg total) by mouth 4 (four) times daily for 10 days. 40 capsule 0   No current facility-administered medications on file prior to visit.    Review of Systems  Constitutional:  Positive for fatigue. Negative for activity change, appetite change, fever and unexpected weight change.  HENT:  Positive for congestion, postnasal drip, rhinorrhea and sore throat. Negative for ear pain, sinus pressure, sinus pain, trouble swallowing and voice change.   Eyes:  Negative for pain, redness and visual disturbance.  Respiratory:  Positive for cough. Negative for shortness of breath and wheezing.   Cardiovascular:  Negative for chest pain and palpitations.  Gastrointestinal:  Positive for abdominal distention and nausea. Negative for abdominal pain, anal bleeding, blood in stool, constipation, diarrhea and vomiting.       Diarrhea is much improved   Endocrine: Negative for polydipsia and polyuria.  Genitourinary:  Negative for dysuria, frequency and urgency.  Musculoskeletal:  Negative for arthralgias, back pain and myalgias.  Skin:  Negative for pallor and rash.  Allergic/Immunologic: Negative for environmental allergies.  Neurological:  Negative for dizziness, syncope and headaches.  Hematological:  Negative for adenopathy. Does not bruise/bleed easily.  Psychiatric/Behavioral:  Negative for decreased concentration and dysphoric mood. The patient is not nervous/anxious.        Objective:   Physical Exam Constitutional:      General: She is  not in acute distress.    Appearance: Normal appearance. She is well-developed. She is obese. She is not ill-appearing, toxic-appearing or diaphoretic.  HENT:     Head: Normocephalic and atraumatic.     Comments: Nares are injected and congested      Right Ear: Tympanic membrane, ear canal and external ear normal.     Left Ear: Tympanic membrane, ear canal and external ear normal.     Nose: Congestion and rhinorrhea present.     Mouth/Throat:     Mouth: Mucous membranes are moist.     Pharynx: Oropharynx is clear. No oropharyngeal exudate or posterior oropharyngeal erythema.     Comments: Clear pnd  Eyes:     General:        Right eye: No discharge.        Left eye: No discharge.     Conjunctiva/sclera: Conjunctivae normal.     Pupils: Pupils are equal, round, and reactive to light.  Cardiovascular:     Rate and Rhythm: Normal rate.     Heart sounds: Normal heart sounds.  Pulmonary:     Effort: Pulmonary effort is normal. No respiratory distress.     Breath sounds: Normal breath sounds. No stridor. No wheezing, rhonchi or rales.     Comments: Good air exch  No rales, rhonchi or wheeze Chest:     Chest wall: No tenderness.  Abdominal:     General: Bowel sounds are normal. There is no distension.     Tenderness: There is no abdominal tenderness. There is no guarding or rebound.  Musculoskeletal:     Cervical back: Normal range of motion and neck supple.  Lymphadenopathy:     Cervical: No cervical adenopathy.  Skin:    General: Skin  is warm and dry.     Capillary Refill: Capillary refill takes less than 2 seconds.     Findings: No rash.  Neurological:     Mental Status: She is alert.     Cranial Nerves: No cranial nerve deficit.  Psychiatric:        Mood and Affect: Mood normal.           Assessment & Plan:   Problem List Items Addressed This Visit       Respiratory   Viral URI with cough    Negative covid and flu testing  Reassuring exam Disc symptomatic  care Rest and fluids  ER parameters discussed Will monitor         Digestive   C. difficile colitis - Primary    Seen by Dr Volanda Napoleon on 12/6  This occurred after a course of augmentin for sinusitis Improved with vancomycin - still has 3 d left to finish  Diarrhea and cramping is improved  Nausea may be from infx and/or abx-continues zofran and phenergan Disc imp of fluids  Will monitor closely - if watery stool returns inst to inform us asap       Other Visit Diagnoses     Nasal congestion       Relevant Orders   POC COVID-19 (Completed)   POCT Influenza A/B (Completed)

## 2022-09-16 NOTE — Assessment & Plan Note (Signed)
Seen by Dr Volanda Napoleon on 12/6  This occurred after a course of augmentin for sinusitis Improved with vancomycin - still has 3 d left to finish  Diarrhea and cramping is improved  Nausea may be from infx and/or abx-continues zofran and phenergan Disc imp of fluids  Will monitor closely - if watery stool returns inst to inform us asap

## 2022-09-17 ENCOUNTER — Encounter: Payer: Self-pay | Admitting: Family Medicine

## 2022-09-20 ENCOUNTER — Encounter: Payer: Self-pay | Admitting: Family Medicine

## 2022-09-20 DIAGNOSIS — R197 Diarrhea, unspecified: Secondary | ICD-10-CM | POA: Insufficient documentation

## 2022-09-20 DIAGNOSIS — A498 Other bacterial infections of unspecified site: Secondary | ICD-10-CM | POA: Insufficient documentation

## 2022-09-20 DIAGNOSIS — R110 Nausea: Secondary | ICD-10-CM | POA: Insufficient documentation

## 2022-09-20 NOTE — Assessment & Plan Note (Signed)
Zofran 4 mg daily Has prescription for Phenergan

## 2022-09-20 NOTE — Assessment & Plan Note (Signed)
Refill Bentyl

## 2022-09-21 ENCOUNTER — Ambulatory Visit: Payer: BC Managed Care – PPO | Admitting: Family Medicine

## 2022-09-22 ENCOUNTER — Telehealth: Payer: Self-pay | Admitting: *Deleted

## 2022-09-22 ENCOUNTER — Encounter: Payer: Self-pay | Admitting: Family Medicine

## 2022-09-22 NOTE — Telephone Encounter (Signed)
Pt sent a message saying:  Hi, I just wanted to let you know that on the [facility] Stool Chart, I've been hovering around a type 6.  Criss Rosales food is all my colon can tolerate, but I have a healthy appetite and I've been going 2-3 times in the morning when I get up and move around, but nothing during the day or night after that. Nausea is much improved.  Is a 6 still acceptable at this point in the (hopefully) healing process?  I finished the antibiotics on Saturday.

## 2022-09-22 NOTE — Telephone Encounter (Signed)
I think that is ok but if things worsen and you get watery stool or feel bad let us know  (nausea, fever, cramping)  If no further improvement in a week let us know  Continue a probiotic if you are taking one Thanks so much for updating me

## 2022-09-22 NOTE — Telephone Encounter (Signed)
Sent mychart letting pt know  ?

## 2022-09-27 ENCOUNTER — Ambulatory Visit
Admission: EM | Admit: 2022-09-27 | Discharge: 2022-09-27 | Disposition: A | Discharge status: Home / Self Care | Payer: BC Managed Care – PPO | Attending: Internal Medicine | Admitting: Internal Medicine

## 2022-09-27 ENCOUNTER — Ambulatory Visit (INDEPENDENT_AMBULATORY_CARE_PROVIDER_SITE_OTHER): Payer: BC Managed Care – PPO

## 2022-09-27 ENCOUNTER — Ambulatory Visit: Admit: 2022-09-27 | Discharge status: Absent without leave | Payer: BC Managed Care – PPO

## 2022-09-27 DIAGNOSIS — M25462 Effusion, left knee: Secondary | ICD-10-CM

## 2022-09-27 DIAGNOSIS — M25562 Pain in left knee: Secondary | ICD-10-CM

## 2022-09-27 DIAGNOSIS — S8002XA Contusion of left knee, initial encounter: Secondary | ICD-10-CM

## 2022-09-27 MED ORDER — NAPROXEN 500 MG PO TABS: 500.0000 mg | ORAL_TABLET | Freq: Two times a day (BID) | ORAL | 0 refills | Status: DC

## 2022-09-27 NOTE — ED Provider Notes (Signed)
[facility]  Note:  This document was prepared using Systems analyst and may include unintentional dictation errors.  MRN: [id number] DOB: 10-Jun-1976  Subjective:   Debra Edwards is a 46 y.o. female presenting for suffering a left knee injury 2 hours prior to arrival. Patient fell accidentally absorbing the impact onto her left knee against concrete. No wounds. Has had swelling, difficulty ambulating but can bear weight.   No current facility-administered medications for this encounter.  Current Outpatient Medications:    acetaminophen (TYLENOL) 500 MG tablet, Take 500 mg by mouth every 6 (six) hours as needed. For headache, Disp: , Rfl:    albuterol (VENTOLIN HFA) 108 (90 Base) MCG/ACT inhaler, Inhale 2 puffs into the lungs every 6 (six) hours as needed for wheezing or shortness of breath., Disp: 8 g, Rfl: 0   BALZIVA 0.4-35 MG-MCG tablet, TAKE 1 TABLET BY MOUTH DAILY. TAKE CONTINUOUSLY (Patient taking differently: TAKE 1 TABLET BY MOUTH DAILY. TAKE CONTINUOUSLY (skip placebo weekl)), Disp: 112 tablet, Rfl: 3   BELSOMRA 10 MG TABS, Take 1 tablet by mouth at bedtime., Disp: , Rfl:    benzonatate (TESSALON) 100 MG capsule, Take 1 capsule (100 mg total) by mouth 3 (three) times daily as needed for cough., Disp: 21 capsule, Rfl: 0   busPIRone (BUSPAR) 30 MG tablet, Take 30 mg by mouth 2 (two) times daily., Disp: , Rfl:    clonazePAM (KLONOPIN) 0.5 MG tablet, Take 0.5 mg by mouth at bedtime. , Disp: , Rfl:    desvenlafaxine (PRISTIQ) 50 MG 24 hr tablet, Take 50 mg by mouth daily., Disp: , Rfl:    dicyclomine (BENTYL) 20 MG tablet, TAKE 1 TABLET 4 TIMES A DAY AS NEEDED FOR ABDOMINAL PAIN, Disp: 360 tablet, Rfl: 1   famotidine (PEPCID) 20 MG tablet, Take 20 mg by mouth daily as needed for heartburn or indigestion., Disp: , Rfl:    fluticasone (FLONASE) 50 MCG/ACT nasal spray, SPRAY 2 SPRAYS INTO EACH NOSTRIL EVERY DAY, Disp: 48 mL, Rfl: 3    hyoscyamine (LEVSIN) 0.125 MG tablet, Take 1 tablet (0.125 mg total) by mouth every 6 (six) hours as needed for cramping., Disp: 30 tablet, Rfl: 4   megestrol (MEGACE) 40 MG tablet, Take 1 tablet (40 mg total) by mouth daily. Can increase to two tablets a day in the event of heavy bleeding, Disp: 60 tablet, Rfl: 5   Multiple Vitamins-Minerals (CULTURELLE PROBIOTICS + MULTIV) CHEW, take, Disp: 30 tablet, Rfl: 1   ondansetron (ZOFRAN) 8 MG tablet, Take 1 tablet (8 mg total) by mouth every 8 (eight) hours as needed for nausea or vomiting (if phenergan does not work)., Disp: 20 tablet, Rfl: 0   promethazine (PHENERGAN) 25 MG tablet, Take 1 tablet (25 mg total) by mouth every 8 (eight) hours as needed for nausea or vomiting (if zofran does not work)., Disp: 20 tablet, Rfl: 0   No Known Allergies  Past Medical History:  Diagnosis Date   Acid reflux    occasional   Allergic rhinitis, seasonal    vasomotor rhinitis   Anemia in the past   is a blood donor   Anxiety    Constipation, chronic    Depression    Dysmenorrhea    GERD (gastroesophageal reflux disease)    Heart murmur    Negative echo in the past   History of chicken pox    IBS (irritable bowel syndrome)    Migraines    OSA (obstructive  sleep apnea)    PSG 08/20/10 RDI 18.7   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 bact inf   Irritable bowel syndrome Mother    Hypertension Father        Not in contact with   Allergies Father    Heart disease Father    Diabetes Maternal Grandfather    Cancer Paternal Grandfather        unsure type   Anxiety disorder Other        diffusely in  family   Depression Other        diffusely in family   Colon cancer Neg Hx    Breast cancer Neg Hx     Social History   Tobacco Use   Smoking status: Never   Smokeless tobacco: Never  Vaping Use   Vaping Use: Never used  Substance Use Topics   Alcohol use: Yes    Alcohol/week: 0.0 standard drinks of alcohol    Comment: Rare   Drug use: No    ROS   Objective:   Vitals: BP 135/87 (BP Location: Right Arm)   Pulse 80   Temp 98.8 F (37.1 C) (Oral)   Resp 20   SpO2 98%   Physical Exam Constitutional:      General: She is not in acute distress.    Appearance: Normal appearance. She is well-developed. She is not ill-appearing, toxic-appearing or diaphoretic.  HENT:     Head: Normocephalic and atraumatic.     Nose: Nose normal.     Mouth/Throat:     Mouth: Mucous membranes are moist.  Eyes:     General: No scleral icterus.       Right eye: No discharge.        Left eye: No discharge.     Extraocular Movements: Extraocular movements intact.  Cardiovascular:     Rate and Rhythm: Normal rate.  Pulmonary:     Effort: Pulmonary effort is normal.  Musculoskeletal:     Left knee: Swelling and bony tenderness present. No deformity, effusion, erythema, ecchymosis or lacerations. Decreased range of motion. Tenderness present.     Left lower leg: No swelling, deformity, lacerations, tenderness or bony tenderness. No edema.  Skin:    General: Skin is warm and dry.  Neurological:     General: No focal deficit present.     Mental Status: She is alert and oriented to person, place, and time.  Psychiatric:        Mood and Affect: Mood normal.        Behavior: Behavior normal.     DG Knee Complete 4 Views Left  Result Date: 09/27/2022 CLINICAL DATA:  injury EXAM: LEFT KNEE - COMPLETE 4+ VIEW COMPARISON:  None Available. FINDINGS: Osteopenia. No acute fracture or dislocation. Joint spaces and alignment are maintained. No area of erosion or osseous destruction. No unexpected  radiopaque foreign body. Soft tissues are unremarkable. IMPRESSION: No acute fracture or dislocation. Electronically Signed   By: Valentino Saxon M.D.   On: 09/27/2022 15:01     Assessment and  Plan :   PDMP not reviewed this encounter.  1. Contusion of left knee, initial encounter   2. Pain and swelling of left knee     Ace wrap applied to the left knee. Recommended conservative management for left knee contusion.  Use RICE method, naproxen for pain and inflammation. Counseled patient on potential for adverse effects with medications prescribed/recommended today, ER and return-to-clinic precautions discussed, patient verbalized understanding.    Jaynee Eagles, [place] 09/28/22 [phone]

## 2022-09-27 NOTE — ED Triage Notes (Addendum)
Pt states she injured left knee ~12pm-chair collapsed and she fell on knee onto concrete-no break in skin noted-NAD-limping gait

## 2022-09-28 ENCOUNTER — Other Ambulatory Visit: Payer: Self-pay | Admitting: Family Medicine

## 2022-09-30 ENCOUNTER — Encounter: Payer: Self-pay | Admitting: Family Medicine

## 2022-09-30 ENCOUNTER — Ambulatory Visit: Payer: BC Managed Care – PPO | Admitting: Family Medicine

## 2022-09-30 VITALS — Temp 97.6°F | Ht 62.5 in | Wt 169.0 lb

## 2022-09-30 DIAGNOSIS — A0472 Enterocolitis due to Clostridium difficile, not specified as recurrent: Secondary | ICD-10-CM | POA: Diagnosis not present

## 2022-09-30 NOTE — Assessment & Plan Note (Signed)
Much clinical improvement after 10 d course of vanco (tolerated poorly due to nausea but finished it)  Now some intermittent mushy stools (not watery) consistent with ibs - improved after changing to a more bland diet Reassuring exam  Disc expectations for recovery  Will advance diet very slowly Continue probiotic Watch for return of diarrhea, fever, nausea, abd pain or cramping  Would not re test as she will likely get false pos this soon after illness  Update if not starting to improve in a week or if worsening  ER precautions noted

## 2022-09-30 NOTE — Patient Instructions (Addendum)
I'm glad you are improving   Continue a bland diet and re introduce fiber very slowly and gradually   Keep up fluids  Watch for abdominal cramping and pain  Watch for fever, nausea or other new symptoms    Continue the probiotics   Let's watch your symptoms carefully

## 2022-09-30 NOTE — Progress Notes (Signed)
Subjective:    Patient ID: Debra Edwards, female    DOB: 16-Nov-1975, 46 y.o.   MRN: [id number]  HPI Pt presents for f/u of c diff  Wt Readings from Last 3 Encounters:  09/30/22 169 lb (76.7 kg)  09/16/22 175 lb (79.4 kg)  09/09/22 169 lb 9.6 oz (76.9 kg)   30.42 kg/m   Vitals:   09/30/22 1525  Temp: 97.6 F (36.4 C)  TempSrc: Skin  SpO2: 98%  Weight: 169 lb (76.7 kg)  Height: 5' 2.5" (1.588 m)     Seen last time 12/13 with improvement in the middle fo vancomycin course  Noted some nausea at that time but was clinically improved   Noted by 12/19 still some mushy stools but much improved nausea   Overall is getting better  Better then she was a week ago   Nausea is dramatically better off the vancomycin   Last wk she was off abx for the first time  6 on the bristol stool scale/ mushy  She met with her nutritionist who recommended rolling back fiber intake and advancing more slowly  Now less spasms  No BM at all on 12/25- this was reassuring  Has not had bm today yet  Bland diet for the past 5-6 days  Plans to add 1/4 cup grains daily short term as tolerated   She tends to have IBS  Takes occ hyoscamine or bentyl   No nausea  No liquid stool  No abdominal pain   Is eating several smaller meals through the day  Taking probiotics   She does tend to drink a lot of water     Of note she was seen on 12/24 after fall onto her L knee  Xray was reassuring  Consv management was recommended   Lab Results  Component Value Date   WBC 6.8 09/09/2022   HGB 13.7 09/09/2022   HCT 40.8 09/09/2022   MCV 88.7 09/09/2022   PLT 278.0 09/09/2022   Patient Active Problem List   Diagnosis Date Noted   Nausea 09/20/2022   Acute diarrhea 09/20/2022   Clostridium difficile infection 09/20/2022   C. 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   Routine general medical examination at a health care facility 10/27/2012   Migraine headache 06/24/2012   Stress reaction 03/16/2012   GERD (gastroesophageal reflux disease) 02/19/2012   IBS (irritable bowel syndrome) 04/14/2011   OBSTRUCTIVE SLEEP APNEA 09/05/2010   BRUXISM 07/24/2010   RESTLESS LEG SYNDROME 07/24/2010   ALLERGIC RHINITIS 12/17/2009   PTSD 10/20/2007   FIBROCYSTIC BREAST DISEASE 10/20/2007   DYSMENORRHEA 10/20/2007   Past Medical History:  Diagnosis Date   Acid reflux    occasional   Allergic rhinitis, seasonal    vasomotor rhinitis   Anemia in the past   is a blood donor   Anxiety    Constipation, chronic    Depression    Dysmenorrhea    GERD (gastroesophageal reflux disease)    Heart murmur    Negative echo in the past   History of chicken pox    IBS (irritable bowel syndrome)    Migraines    OSA (obstructive sleep apnea)    PSG 08/20/10 RDI  18.7   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 bact inf   Irritable bowel syndrome Mother    Hypertension Father        Not in contact with   Allergies Father    Heart disease Father    Diabetes Maternal Grandfather    Cancer Paternal Grandfather         unsure type   Anxiety disorder Other        diffusely in family   Depression Other        diffusely in family   Colon cancer Neg Hx    Breast cancer Neg Hx    No Known Allergies Current Outpatient Medications on File Prior to Visit  Medication Sig Dispense Refill   acetaminophen (TYLENOL) 500 MG tablet Take 500 mg by mouth every 6 (six) hours as needed. For headache     albuterol (VENTOLIN HFA) 108 (90 Base) MCG/ACT inhaler Inhale 2 puffs into the lungs every 6 (six) hours as needed for wheezing or shortness of breath. 8 g 0   BALZIVA 0.4-35 MG-MCG tablet TAKE 1 TABLET BY MOUTH DAILY. TAKE CONTINUOUSLY (Patient taking differently: TAKE 1 TABLET BY MOUTH DAILY. TAKE CONTINUOUSLY (skip placebo weekl)) 112 tablet 3   benzonatate (TESSALON) 100 MG capsule Take 1 capsule (100 mg total) by mouth 3 (three) times daily as needed for cough. 21 capsule 0   busPIRone (BUSPAR) 30 MG tablet Take 30 mg by mouth 2 (two) times daily.     clonazePAM (KLONOPIN) 0.5 MG tablet Take 0.5 mg by mouth at bedtime.      desvenlafaxine (PRISTIQ) 50 MG 24 hr tablet Take 50 mg by mouth daily.     dicyclomine (BENTYL) 20 MG tablet TAKE 1 TABLET 4 TIMES A DAY AS NEEDED FOR ABDOMINAL PAIN 360 tablet 1   famotidine (PEPCID) 20 MG tablet Take 20 mg by mouth daily as needed for heartburn or indigestion.     fluticasone (FLONASE) 50 MCG/ACT nasal spray SPRAY 2 SPRAYS INTO EACH NOSTRIL EVERY DAY 48 mL 3   hyoscyamine (LEVSIN) 0.125 MG tablet Take 1 tablet (0.125 mg total) by mouth every 6 (six) hours as needed for cramping. 30 tablet 4   megestrol (MEGACE) 40 MG tablet Take 1 tablet (40 mg total) by mouth daily. Can increase to two tablets a day in the event of heavy bleeding 60 tablet 5   Multiple Vitamins-Minerals (CULTURELLE PROBIOTICS + MULTIV) CHEW take 30 tablet 1   naproxen (NAPROSYN) 500 MG tablet Take 1 tablet (500 mg total) by mouth 2 (two) times daily with a meal. 30 tablet 0   ondansetron (ZOFRAN) 8 MG tablet Take 1  tablet (8 mg total) by mouth every 8 (eight) hours as needed for nausea or vomiting (  if phenergan does not work). 20 tablet 0   promethazine (PHENERGAN) 25 MG tablet Take 1 tablet (25 mg total) by mouth every 8 (eight) hours as needed for nausea or vomiting (if zofran does not work). 20 tablet 0   Suvorexant (BELSOMRA) 5 MG TABS Take by mouth at bedtime.     No current facility-administered medications on file prior to visit.      Review of Systems  Constitutional:  Negative for activity change, appetite change, fatigue, fever and unexpected weight change.  HENT:  Negative for congestion, ear pain, rhinorrhea, sinus pressure and sore throat.   Eyes:  Negative for pain, redness and visual disturbance.  Respiratory:  Negative for cough, shortness of breath and wheezing.   Cardiovascular:  Negative for chest pain and palpitations.  Gastrointestinal:  Negative for abdominal pain, blood in stool, constipation, diarrhea, nausea, rectal pain and vomiting.       Mushy stool but not watery  Less bloated No pain   Endocrine: Negative for polydipsia and polyuria.  Genitourinary:  Negative for dysuria, frequency and urgency.  Musculoskeletal:  Negative for arthralgias, back pain and myalgias.  Skin:  Negative for pallor and rash.  Allergic/Immunologic: Negative for environmental allergies.  Neurological:  Negative for dizziness, syncope and headaches.  Hematological:  Negative for adenopathy. Does not bruise/bleed easily.  Psychiatric/Behavioral:  Negative for decreased concentration and dysphoric mood. The patient is not nervous/anxious.        Objective:   Physical Exam Constitutional:      General: She is not in acute distress.    Appearance: Normal appearance. She is well-developed. She is obese. She is not ill-appearing or diaphoretic.  HENT:     Head: Normocephalic and atraumatic.  Eyes:     General: No scleral icterus.    Conjunctiva/sclera: Conjunctivae normal.     Pupils: Pupils  are equal, round, and reactive to light.  Cardiovascular:     Rate and Rhythm: Normal rate and regular rhythm.     Heart sounds: Normal heart sounds.  Pulmonary:     Effort: Pulmonary effort is normal. No respiratory distress.     Breath sounds: Normal breath sounds. No wheezing or rales.  Abdominal:     General: Abdomen is protuberant. Bowel sounds are normal. There is no distension.     Palpations: Abdomen is soft. There is no hepatomegaly, splenomegaly, mass or pulsatile mass.     Tenderness: There is no abdominal tenderness. There is no right CVA tenderness, left CVA tenderness, guarding or rebound.     Hernia: No hernia is present.  Musculoskeletal:     Cervical back: Normal range of motion and neck supple.  Lymphadenopathy:     Cervical: No cervical adenopathy.  Skin:    General: Skin is warm and dry.     Coloration: Skin is not pale.     Findings: No erythema.  Neurological:     Mental Status: She is alert.           Assessment & Plan:   Problem List Items Addressed This Visit       Digestive   C. difficile colitis - Primary    Much clinical improvement after 10 d course of vanco (tolerated poorly due to nausea but finished it)  Now some intermittent mushy stools (not watery) consistent with ibs - improved after changing to a more bland diet Reassuring exam  Disc expectations for recovery  Will advance diet very slowly Continue probiotic Watch for return of diarrhea,  fever, nausea, abd pain or cramping  Would not re test as she will likely get false pos this soon after illness  Update if not starting to improve in a week or if worsening  ER precautions noted

## 2022-10-02 ENCOUNTER — Encounter: Payer: Self-pay | Admitting: Family Medicine

## 2022-11-13 ENCOUNTER — Encounter: Payer: Self-pay | Admitting: Family Medicine

## 2022-11-23 ENCOUNTER — Encounter: Payer: Self-pay | Admitting: Family Medicine

## 2022-11-27 ENCOUNTER — Other Ambulatory Visit: Payer: Self-pay | Admitting: Obstetrics & Gynecology

## 2022-11-27 DIAGNOSIS — N924 Excessive bleeding in the premenopausal period: Secondary | ICD-10-CM

## 2022-12-07 ENCOUNTER — Encounter: Payer: Self-pay | Admitting: Family Medicine

## 2022-12-07 MED ORDER — ACYCLOVIR 5 % EX OINT: 1.0000 | TOPICAL_OINTMENT | CUTANEOUS | 3 refills | Status: DC

## 2023-03-10 ENCOUNTER — Other Ambulatory Visit: Payer: Self-pay | Admitting: Family Medicine

## 2023-03-10 DIAGNOSIS — Z1231 Encounter for screening mammogram for malignant neoplasm of breast: Secondary | ICD-10-CM

## 2023-03-22 ENCOUNTER — Encounter: Payer: Self-pay | Admitting: Family Medicine

## 2023-03-30 ENCOUNTER — Encounter: Payer: Self-pay | Admitting: Family Medicine

## 2023-03-30 ENCOUNTER — Telehealth (INDEPENDENT_AMBULATORY_CARE_PROVIDER_SITE_OTHER): Payer: BC Managed Care – PPO | Admitting: Family Medicine

## 2023-03-30 DIAGNOSIS — G43C0 Periodic headache syndromes in child or adult, not intractable: Secondary | ICD-10-CM

## 2023-03-30 MED ORDER — CYCLOBENZAPRINE HCL 10 MG PO TABS: 5.0000 mg | ORAL_TABLET | Freq: Three times a day (TID) | ORAL | 2 refills | Status: DC | PRN

## 2023-03-30 NOTE — Progress Notes (Signed)
Virtual Visit via Video Note  I connected with Debra Edwards on 03/30/23 at  9:00 AM EDT by a video enabled telemedicine application and verified that I am speaking with the correct person using two identifiers.  Location: Patient: home Provider: office    I discussed the limitations of evaluation and management by telemedicine and the availability of in person appointments. The patient expressed understanding and agreed to proceed.  Parties involved in encounter  Patient: Debra Edwards   Provider:  Roxy Manns MD   History of Present Illness: Pt presents with spasms in head and neck causing headache   Started last week History of migraines triggered by muscle spasm  Still happens occasionally   Has some stomach episodes  Has IBSD flare- better now   Interested in flexeril- helped in the past- did not use often but it works It does knock her out   Realized she has one pill left    overall doing very well since changing to continuous OC No aura   No other neuro symptoms  Is due for eye exam/vision exam this summer   No blood pressure problems  Patient Active Problem List   Diagnosis Date Noted   Nausea 09/20/2022   Acute diarrhea 09/20/2022   Clostridium difficile infection 09/20/2022   C. 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   Routine general medical examination at a health care facility 10/27/2012   Migraine headache 06/24/2012   Stress reaction 03/16/2012   GERD (gastroesophageal reflux disease) 02/19/2012   IBS (irritable bowel syndrome) 04/14/2011   OBSTRUCTIVE SLEEP APNEA 09/05/2010   BRUXISM 07/24/2010   RESTLESS LEG SYNDROME 07/24/2010   ALLERGIC  RHINITIS 12/17/2009   PTSD 10/20/2007   FIBROCYSTIC BREAST DISEASE 10/20/2007   DYSMENORRHEA 10/20/2007   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 bact inf   Irritable bowel syndrome Mother  Hypertension Father        Not in contact with   Allergies Father    Heart disease Father    Diabetes Maternal Grandfather    Cancer Paternal Grandfather        unsure type   Anxiety disorder Other        diffusely in family   Depression Other        diffusely in family   Colon cancer Neg Hx    Breast cancer Neg Hx    No Known Allergies Current Outpatient Medications on File Prior to Visit  Medication Sig Dispense Refill   acetaminophen (TYLENOL) 500 MG tablet Take 500 mg by mouth every 6 (six) hours as needed. For headache     acyclovir ointment (ZOVIRAX) 5 % Apply 1 Application topically every 3 (three) hours. To cold sore 15 g 3   BALZIVA 0.4-35 MG-MCG  tablet TAKE 1 TABLET BY MOUTH DAILY. TAKE CONTINUOUSLY 112 tablet 3   busPIRone (BUSPAR) 30 MG tablet Take 30 mg by mouth 2 (two) times daily.     clonazePAM (KLONOPIN) 0.5 MG tablet Take 0.5 mg by mouth at bedtime.      dicyclomine (BENTYL) 20 MG tablet TAKE 1 TABLET 4 TIMES A DAY AS NEEDED FOR ABDOMINAL PAIN 360 tablet 1   escitalopram (LEXAPRO) 20 MG tablet Take 20 mg by mouth daily.     fluticasone (FLONASE) 50 MCG/ACT nasal spray SPRAY 2 SPRAYS INTO EACH NOSTRIL EVERY DAY 48 mL 3   hyoscyamine (LEVSIN) 0.125 MG tablet Take 1 tablet (0.125 mg total) by mouth every 6 (six) hours as needed for cramping. 30 tablet 4   megestrol (MEGACE) 40 MG tablet TAKE 1 TABLET (40 MG) BY MOUTH DAILY. CAN INCREASE TO 2 TABLETS A DAY IN THE EVENT OF HEAVY BLEEDING 180 tablet 4   Multiple Vitamins-Minerals (CULTURELLE PROBIOTICS + MULTIV) CHEW take 30 tablet 1   Suvorexant (BELSOMRA) 5 MG TABS Take by mouth at bedtime.     albuterol (VENTOLIN HFA) 108 (90 Base) MCG/ACT inhaler Inhale 2 puffs into the lungs every 6 (six) hours as needed for wheezing or shortness of breath. (Patient not taking: Reported on 03/30/2023) 8 g 0   benzonatate (TESSALON) 100 MG capsule Take 1 capsule (100 mg total) by mouth 3 (three) times daily as needed for cough. (Patient not taking: Reported on 03/30/2023) 21 capsule 0   famotidine (PEPCID) 20 MG tablet Take 20 mg by mouth daily as needed for heartburn or indigestion. (Patient not taking: Reported on 03/30/2023)     naproxen (NAPROSYN) 500 MG tablet Take 1 tablet (500 mg total) by mouth 2 (two) times daily with a meal. (Patient not taking: Reported on 03/30/2023) 30 tablet 0   ondansetron (ZOFRAN) 8 MG tablet Take 1 tablet (8 mg total) by mouth every 8 (eight) hours as needed for nausea or vomiting (if phenergan does not work). (Patient not taking: Reported on 03/30/2023) 20 tablet 0   promethazine (PHENERGAN) 25 MG tablet Take 1 tablet (25 mg total) by mouth every 8 (eight) hours as needed  for nausea or vomiting (if zofran does not work). (Patient not taking: Reported on 03/30/2023) 20 tablet 0   No current facility-administered medications on file prior to visit.    Review of Systems  Constitutional:  Negative for chills, fever and malaise/fatigue.  HENT:  Negative for congestion, ear pain, sinus pain and sore throat.   Eyes:  Negative for blurred vision, discharge and redness.  Respiratory:  Negative for cough, shortness of breath and stridor.   Cardiovascular:  Negative for chest pain, palpitations and leg swelling.  Gastrointestinal:  Negative for abdominal pain, diarrhea, nausea and vomiting.  Musculoskeletal:  Negative for myalgias.       Muscle tightness in neck/shoulders   Skin:  Negative for rash.  Neurological:  Positive for headaches. Negative for dizziness.    Observations/Objective: Patient appears well, in no distress Weight is baseline  No facial swelling or asymmetry Normal voice-not hoarse and no slurred speech No obvious tremor or mobility impairment Moving neck and UEs normally Able to hear the call well  No cough or shortness of breath during interview  Talkative and mentally sharp with no cognitive changes No skin changes on face or neck , no rash or pallor Affect is normal    Assessment and Plan: Problem List Items Addressed This Visit       Cardiovascular and Mediastinum   Migraine headache - Primary    Pt needs refill of generic flexeril for headache rescue  Has migraine w/o aura (less frequent since starting continuous OC)  Discussed importance of good headache lifestyle habits/ fluids/ caffeine avoidance and sleep/wake stability  She may have some stress let down issues (end of school year )  No change in medicines Refilled flexeril  Caution of sedation  Update if not improvement   Meds ordered this encounter  Medications   cyclobenzaprine (FLEXERIL) 10 MG tablet    Sig: Take 0.5-1 tablets (5-10 mg total) by mouth 3 (three)  times daily as needed for muscle spasms (headache). Caution of sedation    Dispense:  30 tablet    Refill:  2        Relevant Medications   escitalopram (LEXAPRO) 20 MG tablet   cyclobenzaprine (FLEXERIL) 10 MG tablet     Follow Up Instructions: I sent the generic flexeril to your pharmacy  Use it as needed for headache rescue with caution of sedation  If headaches worsen or become more frequent come in for a visit   Try and practice good lifestyle habits Avoid caffeine Drink lots of water  Take care of yourself    I discussed the assessment and treatment plan with the patient. The patient was provided an opportunity to ask questions and all were answered. The patient agreed with the plan and demonstrated an understanding of the instructions.   The patient was advised to call back or seek an in-person evaluation if the symptoms worsen or if the condition fails to improve as anticipated.     Roxy Manns, MD

## 2023-03-30 NOTE — Assessment & Plan Note (Signed)
Pt needs refill of generic flexeril for headache rescue  Has migraine w/o aura (less frequent since starting continuous OC)  Discussed importance of good headache lifestyle habits/ fluids/ caffeine avoidance and sleep/wake stability  She may have some stress let down issues (end of school year )  No change in medicines Refilled flexeril  Caution of sedation  Update if not improvement   Meds ordered this encounter  Medications   cyclobenzaprine (FLEXERIL) 10 MG tablet    Sig: Take 0.5-1 tablets (5-10 mg total) by mouth 3 (three) times daily as needed for muscle spasms (headache). Caution of sedation    Dispense:  30 tablet    Refill:  2

## 2023-03-30 NOTE — Patient Instructions (Signed)
I sent the generic flexeril to your pharmacy  Use it as needed for headache rescue with caution of sedation  If headaches worsen or become more frequent come in for a visit   Try and practice good lifestyle habits Avoid caffeine Drink lots of water  Take care of yourself

## 2023-04-26 ENCOUNTER — Ambulatory Visit
Admission: RE | Admit: 2023-04-26 | Discharge: 2023-04-26 | Disposition: A | Discharge status: Home / Self Care | Payer: BC Managed Care – PPO | Source: Ambulatory Visit | Attending: Family Medicine | Admitting: Family Medicine

## 2023-04-26 DIAGNOSIS — Z1231 Encounter for screening mammogram for malignant neoplasm of breast: Secondary | ICD-10-CM | POA: Insufficient documentation

## 2023-06-17 ENCOUNTER — Encounter: Payer: Self-pay | Admitting: Dermatology

## 2023-06-17 ENCOUNTER — Ambulatory Visit: Payer: BC Managed Care – PPO | Admitting: Dermatology

## 2023-06-17 VITALS — BP 120/75 | HR 67

## 2023-06-17 DIAGNOSIS — L814 Other melanin hyperpigmentation: Secondary | ICD-10-CM

## 2023-06-17 DIAGNOSIS — L821 Other seborrheic keratosis: Secondary | ICD-10-CM | POA: Diagnosis not present

## 2023-06-17 DIAGNOSIS — Z7189 Other specified counseling: Secondary | ICD-10-CM

## 2023-06-17 DIAGNOSIS — L578 Other skin changes due to chronic exposure to nonionizing radiation: Secondary | ICD-10-CM

## 2023-06-17 DIAGNOSIS — D492 Neoplasm of unspecified behavior of bone, soft tissue, and skin: Secondary | ICD-10-CM

## 2023-06-17 DIAGNOSIS — W908XXA Exposure to other nonionizing radiation, initial encounter: Secondary | ICD-10-CM

## 2023-06-17 NOTE — Patient Instructions (Addendum)

## 2023-06-17 NOTE — Progress Notes (Signed)
   New Patient Visit   Subjective  Debra Edwards is a 47 y.o. female who presents for the following: Spot at left lower eyelid. Dur: few years. Has slowly gotten larger. Non tender, denies itching, denies bleeding.   The patient has spots, moles and lesions to be evaluated, some may be new or changing and the patient may have concern these could be cancer.  The following portions of the chart were reviewed this encounter and updated as appropriate: medications, allergies, medical history  Review of Systems:  No other skin or systemic complaints except as noted in HPI or Assessment and Plan.  Objective  Well appearing patient in no apparent distress; mood and affect are within normal limits.  A focused examination was performed of the following areas: Face   Relevant physical exam findings are noted in the Assessment and Plan.  Left Lateral Lower Eyelid Margin 4 mm brown papule         Assessment & Plan   Neoplasm of skin Left Lateral Lower Eyelid Margin  Skin excision  Lesion length (cm):  0.4 Lesion width (cm):  0.3 Total excision diameter (cm):  0.4 Informed consent: discussed and consent obtained   Timeout: patient name, date of birth, surgical site, and procedure verified   Procedure prep:  Patient was prepped and draped in usual sterile fashion Prep type:  Isopropyl alcohol and povidone-iodine Anesthesia: the lesion was anesthetized in a standard fashion   Anesthetic:  1% lidocaine w/ epinephrine 1-100,000 buffered w/ 8.4% NaHCO3 Instrument used: scissors   Hemostasis achieved with: pressure and electrodesiccation   Hemostasis achieved with comment:  Electrocautery Outcome: patient tolerated procedure well with no complications   Post-procedure details: wound care instructions given   Dressing: mupirocin.    Specimen 1 - Surgical pathology Differential Diagnosis: Nevus R/O dysplasia vs SK  Check Margins: No  2 very small specimens in bottle    Counseling and coordination of care  LENTIGINES Exam: scattered tan macules Due to sun exposure Treatment Plan: Benign-appearing, observe. Recommend daily broad spectrum sunscreen SPF 30+ to sun-exposed areas, reapply every 2 hours as needed.  Call for any changes  ACTINIC DAMAGE - chronic, secondary to cumulative UV radiation exposure/sun exposure over time - diffuse scaly erythematous macules with underlying dyspigmentation - Recommend daily broad spectrum sunscreen SPF 30+ to sun-exposed areas, reapply every 2 hours as needed.  - Recommend staying in the shade or wearing long sleeves, sun glasses (UVA+UVB protection) and wide brim hats (4-inch brim around the entire circumference of the hat). - Call for new or changing lesions.  Return if symptoms worsen or fail to improve.  I, Lawson Radar, CMA, am acting as scribe for Armida Sans, MD.  Documentation: I have reviewed the above documentation for accuracy and completeness, and I agree with the above.  Armida Sans, MD

## 2023-06-19 ENCOUNTER — Encounter: Payer: Self-pay | Admitting: Dermatology

## 2023-06-23 ENCOUNTER — Telehealth: Payer: Self-pay

## 2023-06-23 ENCOUNTER — Encounter: Payer: Self-pay | Admitting: Dermatology

## 2023-06-23 NOTE — Telephone Encounter (Signed)
Discussed pathology results. Patient voiced understanding. Discussed MyChart message and photo regarding discoloration below biopsy site at left lower eyelid. Advised eyelids are sensitive and this appears to be bruising secondary to the procedure. Should resolve over time. C/B if worsens or has not resolved in several days.

## 2023-06-23 NOTE — Telephone Encounter (Signed)
-----   Message from Armida Sans sent at 06/23/2023  5:33 PM EDT ----- FINAL DIAGNOSIS        1. Skin, left lateral lower eyelid margin :       PIGMENTED SEBORRHEIC KERATOSIS   Benign Keratosis No further treatment needed May have some chance of recurrence or new lesion formation

## 2023-07-04 ENCOUNTER — Encounter: Payer: Self-pay | Admitting: Family Medicine

## 2023-07-05 MED ORDER — VALACYCLOVIR HCL 1 G PO TABS: ORAL_TABLET | ORAL | 0 refills | Status: AC

## 2023-07-05 NOTE — Telephone Encounter (Signed)
Do not see on current med list. She does have a cpe set up 08/06/23.

## 2023-07-09 ENCOUNTER — Ambulatory Visit: Payer: BC Managed Care – PPO | Admitting: Family Medicine

## 2023-07-27 ENCOUNTER — Telehealth: Payer: Self-pay | Admitting: Family Medicine

## 2023-07-27 DIAGNOSIS — Z0000 Encounter for general adult medical examination without abnormal findings: Secondary | ICD-10-CM

## 2023-07-27 NOTE — Telephone Encounter (Signed)
-----   Message from Alvina Chou sent at 07/12/2023  3:47 PM EDT ----- Regarding: Lab orders for [facility], 10.25.24 Patient is scheduled for CPX labs, please order future labs, Thanks , Camelia Eng

## 2023-07-30 ENCOUNTER — Other Ambulatory Visit (INDEPENDENT_AMBULATORY_CARE_PROVIDER_SITE_OTHER): Payer: BC Managed Care – PPO

## 2023-07-30 DIAGNOSIS — Z0000 Encounter for general adult medical examination without abnormal findings: Secondary | ICD-10-CM

## 2023-07-30 LAB — CBC WITH DIFFERENTIAL/PLATELET
Basophils Absolute: 0 10*3/uL (ref 0.0–0.1)
Basophils Relative: 0.5 % (ref 0.0–3.0)
Eosinophils Absolute: 0.1 10*3/uL (ref 0.0–0.7)
Eosinophils Relative: 1.9 % (ref 0.0–5.0)
HCT: 38.5 % (ref 36.0–46.0)
Hemoglobin: 12.9 g/dL (ref 12.0–15.0)
Lymphocytes Relative: 37.9 % (ref 12.0–46.0)
Lymphs Abs: 2.3 10*3/uL (ref 0.7–4.0)
MCHC: 33.5 g/dL (ref 30.0–36.0)
MCV: 91 fL (ref 78.0–100.0)
Monocytes Absolute: 0.4 10*3/uL (ref 0.1–1.0)
Monocytes Relative: 6.9 % (ref 3.0–12.0)
Neutro Abs: 3.2 10*3/uL (ref 1.4–7.7)
Neutrophils Relative %: 52.8 % (ref 43.0–77.0)
Platelets: 224 10*3/uL (ref 150.0–400.0)
RBC: 4.23 Mil/uL (ref 3.87–5.11)
RDW: 13.1 % (ref 11.5–15.5)
WBC: 6 10*3/uL (ref 4.0–10.5)

## 2023-07-30 LAB — LIPID PANEL
Cholesterol: 130 mg/dL (ref 0–200)
HDL: 58.6 mg/dL (ref >39.00)
LDL Cholesterol: 47 mg/dL (ref 0–99)
NonHDL: 71.04
Total CHOL/HDL Ratio: 2
Triglycerides: 118 mg/dL (ref 0.0–149.0)
VLDL: 23.6 mg/dL (ref 0.0–40.0)

## 2023-07-30 LAB — COMPREHENSIVE METABOLIC PANEL
ALT: 13 U/L (ref 0–35)
AST: 14 U/L (ref 0–37)
Albumin: 3.7 g/dL (ref 3.5–5.2)
Alkaline Phosphatase: 45 U/L (ref 39–117)
BUN: 8 mg/dL (ref 6–23)
CO2: 24 meq/L (ref 19–32)
Calcium: 8.5 mg/dL (ref 8.4–10.5)
Chloride: 106 meq/L (ref 96–112)
Creatinine, Ser: 0.9 mg/dL (ref 0.40–1.20)
GFR: 75.91 mL/min (ref >60.00)
Glucose, Bld: 83 mg/dL (ref 70–99)
Potassium: 4.3 meq/L (ref 3.5–5.1)
Sodium: 138 meq/L (ref 135–145)
Total Bilirubin: 0.4 mg/dL (ref 0.2–1.2)
Total Protein: 6 g/dL (ref 6.0–8.3)

## 2023-07-30 LAB — TSH: TSH: 6.87 u[IU]/mL — ABNORMAL HIGH (ref 0.35–5.50)

## 2023-08-06 ENCOUNTER — Encounter: Payer: BC Managed Care – PPO | Admitting: Family Medicine

## 2023-08-09 NOTE — Progress Notes (Unsigned)
Subjective:    Patient ID: Debra Edwards, female    DOB: Nov 17, 1975, 47 y.o.   MRN: [id number]  HPI  Here for health maintenance exam and to review chronic medical problems   Wt Readings from Last 3 Encounters:  09/30/22 169 lb (76.7 kg)  09/16/22 175 lb (79.4 kg)  09/09/22 169 lb 9.6 oz (76.9 kg)      There were no vitals filed for this visit.  Immunization History  Administered Date(s) Administered   Influenza Inj Mdck Quad Pf 06/03/2018   Influenza Split 07/10/2011   Influenza Whole 06/07/2009, 06/16/2010   Influenza, Seasonal, Injecte, Preservative Fre 08/13/2015, 06/07/2021   Influenza,inj,Quad PF,6+ Mos 07/08/2016, 06/17/2017, 06/23/2019   Influenza-Unspecified 07/06/2014, 07/03/2020, 06/05/2022   Moderna Covid-19 Vaccine Bivalent Booster 87yrs & up 07/03/2022   Moderna Sars-Covid-2 Vaccination 12/02/2019, 12/30/2019, 07/26/2020, 03/15/2021   Td 03/05/2006, 12/03/2010   Tdap 12/14/2020    Health Maintenance Due  Topic Date Due   HIV Screening  Never done   Hepatitis C Screening  Never done   Colonoscopy  Never done   COVID-19 Vaccine (6 - 2023-24 season) 06/06/2023      Mammogram 7/22024  Self breast exam  Gyn health Pap 04/2022  Ascus with neg HPV Gyn recommended repeat in 1-3 years        Colon cancer screening   Bone health  Dexa  Falls Fractures Supplements  Exercise    Mood    08/03/2022    4:31 PM 07/30/2021    3:29 PM 07/23/2020    4:17 PM 07/21/2019    4:49 PM 07/19/2018    3:58 PM  Depression screen PHQ 2/9  Decreased Interest 1 1 1  0 0  Down, Depressed, Hopeless 1 1 2  0 1  PHQ - 2 Score 2 2 3  0 1  Altered sleeping 2 1 0    Tired, decreased energy 3 1 1     Change in appetite 3 3 3     Feeling bad or failure about yourself  2 2 2     Trouble concentrating 2 1 2     Moving slowly or fidgety/restless 3 0 0    Suicidal thoughts 0 0 0    PHQ-9 Score 17 10 11     Difficult doing work/chores  Very difficult           Patient Active Problem List   Diagnosis Date Noted   Nausea 09/20/2022   Acute diarrhea 09/20/2022   Clostridium difficile infection 09/20/2022   C. 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   Routine general medical examination at a health care facility 10/27/2012   Migraine headache 06/24/2012   Stress reaction 03/16/2012   GERD (gastroesophageal reflux disease) 02/19/2012   IBS (irritable bowel syndrome) 04/14/2011   OBSTRUCTIVE SLEEP APNEA 09/05/2010   BRUXISM 07/24/2010   RESTLESS LEG SYNDROME 07/24/2010   Allergic rhinitis 12/17/2009   PTSD 10/20/2007   FIBROCYSTIC BREAST DISEASE 10/20/2007   DYSMENORRHEA 10/20/2007   Past Medical History:  Diagnosis Date   Acid reflux    occasional   Allergic rhinitis, seasonal    vasomotor rhinitis   Anemia in the past   is a blood donor   Anxiety    Constipation,  chronic    Depression    Dysmenorrhea    GERD (gastroesophageal reflux disease)    Heart murmur    Negative echo in the past   History of chicken pox    IBS (irritable bowel syndrome)    Migraines    OSA (obstructive sleep apnea)    PSG 08/20/10 RDI 18.7   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 bact inf   Irritable bowel syndrome Mother    Hypertension Father        Not in contact with   Allergies Father    Heart disease Father    Diabetes Maternal Grandfather    Cancer Paternal Grandfather        unsure type   Anxiety disorder Other        diffusely in family   Depression Other        diffusely in family   Colon cancer Neg Hx    Breast cancer Neg Hx    No Known Allergies Current Outpatient Medications on File Prior to Visit  Medication Sig Dispense Refill   acetaminophen (TYLENOL) 500 MG tablet Take 500 mg by mouth every 6 (six) hours as needed. For headache     acyclovir ointment (ZOVIRAX) 5 % Apply 1 Application topically every 3 (three) hours. To cold sore 15 g 3   albuterol (VENTOLIN HFA) 108 (90 Base) MCG/ACT inhaler Inhale 2 puffs into the lungs every 6 (six) hours as needed for wheezing or shortness of breath. (Patient not taking: Reported on 03/30/2023) 8 g 0   BALZIVA 0.4-35 MG-MCG tablet TAKE 1 TABLET BY MOUTH DAILY. TAKE CONTINUOUSLY 112 tablet 3   busPIRone (BUSPAR) 30 MG tablet Take 30 mg by mouth 2 (two) times daily.     clonazePAM (KLONOPIN) 0.5 MG tablet Take 0.5 mg by mouth at bedtime.      cyclobenzaprine (FLEXERIL) 10 MG tablet Take 0.5-1 tablets (5-10 mg total) by mouth 3 (three) times daily as needed for muscle spasms (headache). Caution of sedation 30 tablet 2   dicyclomine (BENTYL) 20 MG tablet TAKE 1 TABLET 4 TIMES A DAY AS NEEDED FOR ABDOMINAL PAIN 360 tablet 1   escitalopram (LEXAPRO) 20 MG tablet Take 20 mg by mouth daily.     fluticasone (FLONASE) 50 MCG/ACT nasal spray SPRAY 2 SPRAYS INTO EACH NOSTRIL EVERY DAY 48 mL 3   hyoscyamine (LEVSIN) 0.125 MG tablet Take 1 tablet (0.125 mg total) by mouth every 6 (six) hours as needed for cramping. 30 tablet 4   megestrol (MEGACE) 40 MG tablet TAKE 1 TABLET (40 MG) BY MOUTH DAILY. CAN INCREASE  TO 2 TABLETS A DAY IN THE EVENT OF HEAVY BLEEDING 180 tablet 4   Multiple Vitamins-Minerals (CULTURELLE PROBIOTICS + MULTIV) CHEW take  30 tablet 1   naproxen (NAPROSYN) 500 MG tablet Take 1 tablet (500 mg total) by mouth 2 (two) times daily with a meal. (Patient not taking: Reported on 03/30/2023) 30 tablet 0   ondansetron (ZOFRAN) 8 MG tablet Take 1 tablet (8 mg total) by mouth every 8 (eight) hours as needed for nausea or vomiting (if phenergan does not work). (Patient not taking: Reported on 03/30/2023) 20 tablet 0   promethazine (PHENERGAN) 25 MG tablet Take 1 tablet (25 mg total) by mouth every 8 (eight) hours as needed for nausea or vomiting (if zofran does not work). (Patient not taking: Reported on 03/30/2023) 20 tablet 0   Suvorexant (BELSOMRA) 5 MG TABS Take by mouth at bedtime.     valACYclovir (VALTREX) 1000 MG tablet Take 2 pills by mouth twice daily for 1 day for cold sore 36 tablet 0   No current facility-administered medications on file prior to visit.    Review of Systems     Objective:   Physical Exam        Assessment & Plan:   Problem List Items Addressed This Visit   None

## 2023-08-10 ENCOUNTER — Encounter: Payer: Self-pay | Admitting: Family Medicine

## 2023-08-10 ENCOUNTER — Ambulatory Visit (INDEPENDENT_AMBULATORY_CARE_PROVIDER_SITE_OTHER): Payer: BC Managed Care – PPO | Admitting: Family Medicine

## 2023-08-10 VITALS — BP 112/68 | HR 71 | Temp 98.9°F | Ht 62.25 in | Wt 183.2 lb

## 2023-08-10 DIAGNOSIS — R7989 Other specified abnormal findings of blood chemistry: Secondary | ICD-10-CM | POA: Insufficient documentation

## 2023-08-10 DIAGNOSIS — Z0000 Encounter for general adult medical examination without abnormal findings: Secondary | ICD-10-CM

## 2023-08-10 DIAGNOSIS — Z1211 Encounter for screening for malignant neoplasm of colon: Secondary | ICD-10-CM | POA: Diagnosis not present

## 2023-08-10 DIAGNOSIS — E039 Hypothyroidism, unspecified: Secondary | ICD-10-CM | POA: Insufficient documentation

## 2023-08-10 DIAGNOSIS — Z1321 Encounter for screening for nutritional disorder: Secondary | ICD-10-CM | POA: Insufficient documentation

## 2023-08-10 DIAGNOSIS — F418 Other specified anxiety disorders: Secondary | ICD-10-CM | POA: Insufficient documentation

## 2023-08-10 DIAGNOSIS — R946 Abnormal results of thyroid function studies: Secondary | ICD-10-CM | POA: Diagnosis not present

## 2023-08-10 HISTORY — DX: Hypothyroidism, unspecified: E03.9

## 2023-08-10 LAB — T4, FREE: Free T4: 0.65 ng/dL (ref 0.60–1.60)

## 2023-08-10 LAB — VITAMIN D 25 HYDROXY (VIT D DEFICIENCY, FRACTURES): VITD: 85.32 ng/mL (ref 30.00–100.00)

## 2023-08-10 NOTE — Assessment & Plan Note (Signed)
With PTSD  Seeing psychiatry Continues lexapro, buspar  Prne belsorma   No recent changes Continues counseling as well

## 2023-08-10 NOTE — Assessment & Plan Note (Signed)
Pt desires colonoscopy but worried about prep and flora in setting of past c diff  Referral made to have visit with gi to discuss options for screening

## 2023-08-10 NOTE — Assessment & Plan Note (Signed)
Pt wants vit D level Discussed importance to bone and overall health

## 2023-08-10 NOTE — Assessment & Plan Note (Signed)
New Lab Results  Component Value Date   TSH 6.87 (H) 07/30/2023   Fatigue Trouble losing weight   FT4 today  Mother had history of thyroid nodule and problems

## 2023-08-10 NOTE — Assessment & Plan Note (Addendum)
Reviewed health habits including diet and exercise and skin cancer prevention Reviewed appropriate screening tests for age  Also reviewed health mt list, fam hx and immunization status , as well as social and family history   See HPI Labs reviewed and ordered Encouraged continued exercise and better diet /plant based  Pap 04/2022  Continues OC and progesterone to prevent bleeding  Ref to gi to discuss opt for colon cancer screening  Discussed fall prevention, supplements and exercise for bone density  PHQ and GAD7 are up -seeing psychiatry and counselor

## 2023-08-10 NOTE — Patient Instructions (Addendum)
   I put the referral in to GI to discuss options for colon cancer screening  Call the office to schedule  [facility]  [phone]  Labs look good  Keep up the good work with diet /exercise    Lab for free T4 and vit D today

## 2023-08-11 ENCOUNTER — Encounter: Payer: Self-pay | Admitting: Gastroenterology

## 2023-08-11 ENCOUNTER — Telehealth: Payer: Self-pay | Admitting: *Deleted

## 2023-08-11 MED ORDER — LEVOTHYROXINE SODIUM 25 MCG PO TABS: 25.0000 ug | ORAL_TABLET | Freq: Every day | ORAL | 1 refills | Status: DC

## 2023-08-11 NOTE — Telephone Encounter (Signed)
-----   Message from [place] sent at 08/10/2023  7:29 PM EST ----- Your vitamin D level is fine   Free T4 is in the normal range (toward the lower end) I think you are becoming mildly hypothyroid  Please sent levothyroxine 25 mcg to pharmacy 1 po every day #30 1 refill  Take first thing in am at least 30 minutes before food/vitamins or other medications  Re check TSH in about 6 weeks please

## 2023-08-14 ENCOUNTER — Other Ambulatory Visit: Payer: Self-pay | Admitting: Family Medicine

## 2023-08-26 ENCOUNTER — Other Ambulatory Visit: Payer: Self-pay | Admitting: Family Medicine

## 2023-09-02 ENCOUNTER — Other Ambulatory Visit: Payer: Self-pay | Admitting: Family Medicine

## 2023-09-06 ENCOUNTER — Encounter: Payer: Self-pay | Admitting: Family Medicine

## 2023-09-14 ENCOUNTER — Telehealth: Payer: Self-pay | Admitting: Family Medicine

## 2023-09-14 DIAGNOSIS — E039 Hypothyroidism, unspecified: Secondary | ICD-10-CM

## 2023-09-14 NOTE — Telephone Encounter (Signed)
-----   Message from Vincenza Hews sent at 09/07/2023 10:36 AM EST ----- Regarding: Lab Thurs 09/23/23 Hello,   Patient has a lab appointment on Thursday 09/23/23 for TSH recheck. Can we get lab orders please.   Thanks

## 2023-09-23 ENCOUNTER — Other Ambulatory Visit (INDEPENDENT_AMBULATORY_CARE_PROVIDER_SITE_OTHER): Payer: BC Managed Care – PPO

## 2023-09-23 ENCOUNTER — Encounter: Payer: Self-pay | Admitting: Family Medicine

## 2023-09-23 ENCOUNTER — Other Ambulatory Visit: Payer: Self-pay

## 2023-09-23 DIAGNOSIS — E039 Hypothyroidism, unspecified: Secondary | ICD-10-CM | POA: Diagnosis not present

## 2023-09-23 LAB — TSH: TSH: 4.44 u[IU]/mL (ref 0.35–5.50)

## 2023-09-23 MED ORDER — LEVOTHYROXINE SODIUM 25 MCG PO TABS: 25.0000 ug | ORAL_TABLET | Freq: Every day | ORAL | 3 refills | Status: DC

## 2023-09-30 ENCOUNTER — Other Ambulatory Visit
Admission: RE | Admit: 2023-09-30 | Discharge: 2023-09-30 | Disposition: A | Discharge status: Home / Self Care | Payer: Self-pay | Source: Ambulatory Visit | Attending: Medical Genetics | Admitting: Medical Genetics

## 2023-09-30 ENCOUNTER — Other Ambulatory Visit: Payer: Self-pay | Admitting: Medical Genetics

## 2023-10-11 LAB — GENECONNECT MOLECULAR SCREEN: Genetic Analysis Overall Interpretation: NEGATIVE

## 2023-10-26 ENCOUNTER — Encounter: Payer: Self-pay | Admitting: Family Medicine

## 2023-10-26 ENCOUNTER — Ambulatory Visit: Payer: 59 | Admitting: Family Medicine

## 2023-10-26 ENCOUNTER — Ambulatory Visit (INDEPENDENT_AMBULATORY_CARE_PROVIDER_SITE_OTHER)
Admission: RE | Admit: 2023-10-26 | Discharge: 2023-10-26 | Disposition: A | Discharge status: Home / Self Care | Payer: 59 | Source: Ambulatory Visit | Attending: Family Medicine | Admitting: Family Medicine

## 2023-10-26 VITALS — BP 98/58 | HR 79 | Temp 98.0°F | Ht 62.25 in | Wt 184.1 lb

## 2023-10-26 DIAGNOSIS — M25562 Pain in left knee: Secondary | ICD-10-CM

## 2023-10-26 DIAGNOSIS — M8580 Other specified disorders of bone density and structure, unspecified site: Secondary | ICD-10-CM | POA: Diagnosis not present

## 2023-10-26 NOTE — Assessment & Plan Note (Addendum)
Incidental on xray today  This is new  No recent fractures Perimenopausal on OC and also megace  Reviewed chart and gyn history today/ does see gyn and last note reviewed as well as last testing (history of irreg bleeding)   Would like to see if dexa is covered so we can order  Will have pt check with insurance   Given info on bone health in AVS   15  Minutes were spent today both face to face and in the chart obtaining history, reviewing records (gyn and old imaging)  and test results, performing exam , educating and discussing treatment options, and placing orders

## 2023-10-26 NOTE — Progress Notes (Signed)
Subjective:    Patient ID: Debra Edwards, female    DOB: August 11, 1976, 48 y.o.   MRN: [id number]  HPI  Wt Readings from Last 3 Encounters:  10/26/23 184 lb 2 oz (83.5 kg)  08/10/23 183 lb 4 oz (83.1 kg)  09/30/22 169 lb (76.7 kg)   33.41 kg/m  Vitals:   10/26/23 0822  BP: (!) 98/58  Pulse: 79  Temp: 98 F (36.7 C)  SpO2: 99%   Pt presents for leg and knee pain -left   She stood up and felt a snap/pop in left knee (stood from floor from cross legged)  10 days ago  Pain was anterior -just under her knee  Improved  Still one spot that really hurts if she takes a wrong step / very sharp  Still sore to the touch   Worse with Going up stairs  ? Loading a bent leg   No swelling  No redness No heat  No swelling of ankle or foot  No bruising    Tried wrapping her knee - did not help much  Then then wrapped area under her knee and put a knee brace on top   Exercise  Usually uses elliptical and machines  Holding off leg machines right now   XR today  DG Knee 4 Views W/Patella Left Result Date: 10/26/2023 CLINICAL DATA:  Left knee pain.  Recent injury. EXAM: LEFT KNEE - COMPLETE 4+ VIEW COMPARISON:  Left knee radiograph dated 09/27/2022. FINDINGS: There is no acute fracture or dislocation. The bones are osteopenic. No joint effusion. The soft tissues are unremarkable. IMPRESSION: 1. No acute fracture or dislocation. 2. Osteopenia. Electronically Signed   By: Elgie Collard M.D.   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   Routine general medical examination at a health care facility 10/27/2012   Migraine headache 06/24/2012   Stress reaction 03/16/2012   GERD (gastroesophageal reflux disease) 02/19/2012   IBS (irritable bowel syndrome) 04/14/2011   OBSTRUCTIVE SLEEP APNEA 09/05/2010   BRUXISM 07/24/2010   RESTLESS LEG SYNDROME 07/24/2010   Allergic rhinitis 12/17/2009   PTSD 10/20/2007   FIBROCYSTIC BREAST DISEASE 10/20/2007   DYSMENORRHEA 10/20/2007   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 bact inf   Irritable bowel syndrome Mother    ADD / ADHD Mother    Anxiety disorder Mother    Arthritis Mother    Depression Mother    Hypertension Mother    Obesity Mother    Hypertension Father        Not in contact  with   Allergies Father    Heart disease Father    Alcohol abuse Father    Depression Father    Drug abuse Father    Diabetes Maternal Grandfather    Cancer Paternal Grandfather        unsure type   Anxiety disorder Other        diffusely in family   Depression Other        diffusely in family   Arthritis Maternal Grandmother    ADD / ADHD Maternal Aunt    Anxiety disorder Maternal Aunt    Learning disabilities Maternal Uncle    Colon cancer Neg Hx    Breast cancer Neg Hx    No Known Allergies Current Outpatient Medications on File Prior to Visit  Medication Sig Dispense Refill   acetaminophen (TYLENOL) 500 MG tablet Take 500 mg by mouth every 6 (six) hours as needed. For headache     acyclovir ointment (ZOVIRAX) 5 % Apply 1 Application topically every 3 (three) hours. To cold sore 15 g 3   albuterol (VENTOLIN HFA) 108 (90 Base) MCG/ACT inhaler Inhale 2 puffs into the lungs every 6 (six) hours as needed for wheezing or shortness of breath. 8 g 0   BALZIVA 0.4-35 MG-MCG tablet TAKE 1 TABLET BY MOUTH DAILY. TAKE CONTINUOUSLY 112 tablet 3   busPIRone (BUSPAR) 30 MG tablet Take 30 mg by mouth 2 (two) times daily.     clonazePAM (KLONOPIN) 0.5 MG tablet Take 0.5 mg by mouth at bedtime.      cyclobenzaprine (FLEXERIL) 10 MG tablet Take 0.5-1 tablets (5-10 mg total) by mouth 3 (three) times daily as needed for muscle spasms (headache). Caution of sedation 30 tablet 2   dicyclomine (BENTYL) 20 MG tablet TAKE 1 TABLET 4 TIMES A DAY AS NEEDED FOR ABDOMINAL PAIN 360 tablet 1   escitalopram (LEXAPRO) 20 MG tablet Take 20 mg by mouth daily.     fluticasone (FLONASE) 50 MCG/ACT nasal spray SPRAY 2 SPRAYS INTO EACH NOSTRIL EVERY DAY 48 mL 3   hyoscyamine (LEVSIN) 0.125 MG tablet TAKE 1 TABLET BY MOUTH EVERY 6 HOURS AS NEEDED FOR CRAMPING. 30 tablet 4   levothyroxine (SYNTHROID) 25 MCG tablet Take 1 tablet (25 mcg total) by mouth daily. 90 tablet 3   megestrol (MEGACE) 40 MG tablet TAKE 1 TABLET  (40 MG) BY MOUTH DAILY. CAN INCREASE TO 2 TABLETS A DAY IN THE EVENT OF HEAVY BLEEDING 180 tablet 4  Multiple Vitamins-Minerals (CULTURELLE PROBIOTICS + MULTIV) CHEW take 30 tablet 1   ondansetron (ZOFRAN) 8 MG tablet Take 1 tablet (8 mg total) by mouth every 8 (eight) hours as needed for nausea or vomiting (if phenergan does not work). 20 tablet 0   promethazine (PHENERGAN) 25 MG tablet Take 1 tablet (25 mg total) by mouth every 8 (eight) hours as needed for nausea or vomiting (if zofran does not work). 20 tablet 0   Suvorexant (BELSOMRA) 5 MG TABS Take by mouth at bedtime.     valACYclovir (VALTREX) 1000 MG tablet Take 2 pills by mouth twice daily for 1 day for cold sore 36 tablet 0   No current facility-administered medications on file prior to visit.    Review of Systems  Constitutional:  Negative for activity change, appetite change, fatigue, fever and unexpected weight change.  HENT:  Negative for congestion, ear pain, rhinorrhea, sinus pressure and sore throat.   Eyes:  Negative for pain, redness and visual disturbance.  Respiratory:  Negative for cough, shortness of breath and wheezing.   Cardiovascular:  Negative for chest pain and palpitations.  Gastrointestinal:  Negative for abdominal pain, blood in stool, constipation and diarrhea.  Endocrine: Negative for polydipsia and polyuria.  Genitourinary:  Negative for dysuria, frequency and urgency.  Musculoskeletal:  Positive for arthralgias. Negative for back pain and myalgias.       Left knee pain   Skin:  Negative for pallor and rash.  Allergic/Immunologic: Negative for environmental allergies.  Neurological:  Negative for dizziness, syncope and headaches.  Hematological:  Negative for adenopathy. Does not bruise/bleed easily.  Psychiatric/Behavioral:  Negative for decreased concentration and dysphoric mood. The patient is not nervous/anxious.        Objective:   Physical Exam Musculoskeletal:     Comments: Knee left  No  swelling or effusion  No warmth to the touch  No crepitus  ROM: full Flex full  (slight discomfort) Ext full  Mcmurray- slight discomfort Bounce test normal  Stability: Anterior drawer-normal Lachman exam -normal endpoint   Tenderness -mild over patellar tendon and medial joint line   Gait normal              Assessment & Plan:   Problem List Items Addressed This Visit       Musculoskeletal and Integument   Osteopenia determined by x-ray   Incidental on xray today  This is new  No recent fractures Perimenopausal on OC and also megace    Would like to see if dexa is covered so we can order  Will have pt check with insurance   Given info on bone health         Other   Left knee pain - Primary   After injury (getting off floor from cross legged position -heard a pop  Pain just under patella , no swelling/ecchymosis  Discussed possible strain / meniscal injury , less likely tibial plateau fracture  Xray ordered   Instructed to  Use ice  Voltaren gel  Compression if helpful Elevate when able  Avoid activities that hurt / gym /work on other areas   Knee film:: no fracture or deg change (there was some osteopenia)  Will try consv care for a week and it not continuing to improve plan visit with sport med        Relevant Orders   DG Knee 4 Views W/Patella Left (Completed)

## 2023-10-26 NOTE — Patient Instructions (Addendum)
Avoid activities that hurt  Compression is fine  Use ice for 10 minutes whenever you can    Try voltaren gel over the counter up to four times daily   Xray now  Plan to follow   We will contact you with results

## 2023-10-26 NOTE — Assessment & Plan Note (Addendum)
After injury (getting off floor from cross legged position -heard a pop  Pain just under patella , no swelling/ecchymosis  Discussed possible strain / meniscal injury , less likely tibial plateau fracture  Xray ordered   Instructed to  Use ice  Voltaren gel  Compression if helpful Elevate when able  Avoid activities that hurt / gym /work on other areas   Knee film:: no fracture or deg change (there was some osteopenia)  Will try consv care for a week and it not continuing to improve plan visit with sport med   30  Minutes were spent today (for both knee injury and osteopenia) both face to face and in the chart obtaining history, reviewing records and test results, performing exam , educating and discussing treatment options, and placing orders

## 2023-12-03 ENCOUNTER — Encounter: Payer: Self-pay | Admitting: Gastroenterology

## 2023-12-03 ENCOUNTER — Ambulatory Visit: Payer: 59 | Admitting: Gastroenterology

## 2023-12-03 VITALS — BP 130/68 | HR 78 | Ht 62.0 in | Wt 182.0 lb

## 2023-12-03 DIAGNOSIS — K581 Irritable bowel syndrome with constipation: Secondary | ICD-10-CM

## 2023-12-03 DIAGNOSIS — Z1211 Encounter for screening for malignant neoplasm of colon: Secondary | ICD-10-CM

## 2023-12-03 MED ORDER — SUFLAVE 178.7 G PO SOLR: 1.0000 | Freq: Once | ORAL | 0 refills | Status: AC

## 2023-12-03 NOTE — Progress Notes (Signed)
 12/03/2023 Debra Edwards [id number] 1975-12-23   HISTORY OF PRESENT ILLNESS: This is a 48 year old female who is previously patient of Dr. Christella Hartigan.  She had a colonoscopy by him in September 2012 that was completely normal.  She is here today to discuss another colonoscopy now that she is of screening age.  She does have history of IBS-C and takes MiraLAX 1-2 capfuls daily and feels that with her diet, etc. she has had that under very good control.  She did have an episode of C. difficile in December 2023 after taking some antibiotics for a sinus infection.  She recovered from that and has been doing well.  Had several questions about colonoscopy and we discussed that versus Cologuard, etc..  She had questions about prep, etc. In the end she decided to proceed with colonoscopy.  Referred here by her PCP, Dr. Milinda Antis, for colon cancer screening.  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 She has never used smokeless tobacco. She reports that she does not currently use alcohol. She reports that she does not use drugs. family history includes ADD / ADHD in  her maternal aunt and mother; Alcohol abuse in her father; Allergies in her father; Anxiety disorder in her maternal aunt, mother, and another family member; Arthritis in her maternal grandmother and mother; Cancer in her paternal grandfather; Depression in her father, mother, and another family member; Diabetes in her maternal grandfather; Drug abuse in her father; Heart disease in her father; Hypertension in her father and mother; Irritable bowel syndrome in her mother; Learning disabilities in her maternal uncle; Obesity in her mother; Other in her mother. No Known Allergies    Outpatient Encounter Medications as of 12/03/2023  Medication Sig   busPIRone (BUSPAR) 30 MG tablet Take 30 mg by mouth 2 (two) times daily.   clonazePAM (KLONOPIN) 0.5 MG tablet Take 0.5 mg by mouth at bedtime.    cyclobenzaprine (FLEXERIL) 10 MG tablet Take 0.5-1 tablets (5-10 mg total) by mouth 3 (three) times daily as needed for muscle spasms (headache). Caution of sedation   escitalopram (LEXAPRO) 20 MG tablet Take 30 mg by mouth daily.   fluticasone (FLONASE) 50 MCG/ACT nasal spray SPRAY 2 SPRAYS INTO EACH NOSTRIL EVERY DAY   hyoscyamine (LEVSIN) 0.125 MG tablet TAKE 1 TABLET BY MOUTH EVERY 6 HOURS AS NEEDED FOR CRAMPING.   levothyroxine (SYNTHROID) 25 MCG tablet Take 1 tablet (25 mcg  total) by mouth daily.   megestrol (MEGACE) 40 MG tablet TAKE 1 TABLET (40 MG) BY MOUTH DAILY. CAN INCREASE TO 2 TABLETS A DAY IN THE EVENT OF HEAVY BLEEDING   Multiple Vitamins-Minerals (CULTURELLE PROBIOTICS + MULTIV) CHEW take   ondansetron (ZOFRAN) 8 MG tablet Take 1 tablet (8 mg total) by mouth every 8 (eight) hours as needed for nausea or vomiting (if phenergan does not work).   promethazine (PHENERGAN) 25 MG tablet Take 1 tablet (25 mg total) by mouth every 8 (eight) hours as needed for nausea or vomiting (if zofran does not work).   acetaminophen (TYLENOL) 500 MG tablet Take 500 mg by mouth every 6 (six) hours as needed. For  headache (Patient not taking: Reported on 12/03/2023)   acyclovir ointment (ZOVIRAX) 5 % Apply 1 Application topically every 3 (three) hours. To cold sore (Patient not taking: Reported on 12/03/2023)   albuterol (VENTOLIN HFA) 108 (90 Base) MCG/ACT inhaler Inhale 2 puffs into the lungs every 6 (six) hours as needed for wheezing or shortness of breath. (Patient not taking: Reported on 12/03/2023)   BALZIVA 0.4-35 MG-MCG tablet TAKE 1 TABLET BY MOUTH DAILY. TAKE CONTINUOUSLY   dicyclomine (BENTYL) 20 MG tablet TAKE 1 TABLET 4 TIMES A DAY AS NEEDED FOR ABDOMINAL PAIN (Patient not taking: Reported on 12/03/2023)   Suvorexant (BELSOMRA) 5 MG TABS Take by mouth at bedtime. (Patient not taking: Reported on 12/03/2023)   valACYclovir (VALTREX) 1000 MG tablet Take 2 pills by mouth twice daily for 1 day for cold sore (Patient not taking: Reported on 12/03/2023)   No facility-administered encounter medications on file as of 12/03/2023.    REVIEW OF SYSTEMS  : All other systems reviewed and negative except where noted in the History of Present Illness.   PHYSICAL EXAM: BP 130/68   Pulse 78   Ht 5\' 2"  (1.575 m)   Wt 182 lb (82.6 kg)   BMI 33.29 kg/m  General: Well developed white female in no acute distress Head: Normocephalic and atraumatic Eyes:  Sclerae anicteric, conjunctiva pink. Ears: Normal auditory acuity Lungs: Clear throughout to auscultation; no W/R/R. Heart: Regular rate and rhythm; no M/R/G. Abdomen: Soft, non-distended.  BS present.  Non-tender. Rectal: Will be done at the time of colonoscopy. Musculoskeletal: Symmetrical with no gross deformities  Skin: No lesions on visible extremities Extremities: No edema  Neurological: Alert oriented x 4, grossly non-focal Psychological:  Alert and cooperative. Normal mood and affect  ASSESSMENT AND PLAN: *Colon cancer screening:  Last colonoscopy 2012 with Dr. Christella Hartigan.  Will schedule with Dr. Leone Payor.  The risks, benefits, and alternatives to  colonoscopy were discussed with the patient and she consents to proceed. * IBS-C: Uses MiraLAX 1-2 capfuls daily and overall has her IBS symptoms under good control.  CC:  Tower, Audrie Gallus, MD

## 2023-12-03 NOTE — Patient Instructions (Signed)
 You have been scheduled for a colonoscopy. Please follow written instructions given to you at your visit today.   If you use inhalers (even only as needed), please bring them with you on the day of your procedure.  DO NOT TAKE 7 DAYS PRIOR TO TEST- Trulicity (dulaglutide) Ozempic, Wegovy (semaglutide) Mounjaro (tirzepatide) Bydureon Bcise (exanatide extended release)  DO NOT TAKE 1 DAY PRIOR TO YOUR TEST Rybelsus (semaglutide) Adlyxin (lixisenatide) Victoza (liraglutide) Byetta (exanatide) ___________________________________________________________________________  Bonita Quin will receive your bowel preparation through [facility], which ensures the lowest copay and home delivery, with outreach via text or call from an [phone] number. Please respond promptly to avoid rescheduling of your procedure. If you are interested in alternative options or have any questions regarding your prep, please contact them at [phone] ____________________________________________________________________________  Your Provider Has Sent Your Bowel Prep Regimen To [facility]   [facility] will contact you to verify your information and collect your copay, if applicable. Enjoy the comfort of your home while your prescription is mailed to you, FREE of any shipping charges.   [facility] accepts all major insurance benefits and applies discounts & coupons.  Have additional questions?   Chat: [email or web address] Call: [phone] Email: [email or web address] NCPDP: [id number]  How will [facility] contact you?  With a Welcome phone call,  a Welcome text and a checkout link in text form.  Texts you receive from [phone] Are NOT Spam.  *To set up delivery, you must complete the checkout process via link or speak to one of the patient care representatives. If [facility] is unable to reach you, your prescription may be delayed.  To avoid long hold times on the phone, you may also utilize the secure chat  feature on the [facility] website to request that they call you back for transaction completion or to expedite your concerns.

## 2024-01-06 ENCOUNTER — Encounter: Payer: Self-pay | Admitting: Internal Medicine

## 2024-01-13 ENCOUNTER — Other Ambulatory Visit: Payer: Self-pay | Admitting: Family Medicine

## 2024-01-13 DIAGNOSIS — H6993 Unspecified Eustachian tube disorder, bilateral: Secondary | ICD-10-CM

## 2024-01-14 NOTE — Telephone Encounter (Signed)
 Note on Rx says:  Pharmacy comment: Alternative Requested:INSURANCE WILL ONLY PAY FOR SUBLINGUAL TABLETS...PLEASE SEND THAT IN.

## 2024-01-14 NOTE — Telephone Encounter (Signed)
 I sent this  Please let pt know it is sublingual (under tongue) instead of swallowing a pill  Due to insurance coverage

## 2024-01-14 NOTE — Telephone Encounter (Signed)
 Pt notified of med change.

## 2024-01-17 NOTE — Progress Notes (Unsigned)
 [facility] History and Physical   Primary Care Physician:  Tower, Audrie Gallus, MD   Reason for Procedure:   CRC screening  Plan:    colonoscopy     HPI: Debra Edwards is a 48 y.o. female w/ hx IBS-\ mostly constipation w/ some diarrhea episodes, C diff and 2012 colonoscopy  - normal([facility]).   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 Authorizing Provider  acetaminophen (TYLENOL) 500 MG tablet Take 500 mg by mouth every 6 (six) hours as needed. For headache Patient not taking: Reported on 12/03/2023    [provider]  acyclovir ointment (ZOVIRAX) 5 % Apply 1 Application topically every 3 (three) hours. To cold sore Patient not taking: Reported on 12/03/2023 12/07/22   Tower, Audrie Gallus, MD  albuterol (VENTOLIN HFA) 108 (90 Base) MCG/ACT inhaler Inhale 2 puffs into the lungs every 6 (six) hours as needed for wheezing or shortness of breath. Patient not taking: Reported on 12/03/2023 08/14/22   Cathlyn Parsons, NP  Maryanna Shape 0.4-35 MG-MCG tablet TAKE 1 TABLET BY MOUTH DAILY.  TAKE CONTINUOUSLY 08/26/23   Tower, Audrie Gallus, MD  busPIRone (BUSPAR) 30 MG tablet Take 30 mg by mouth 2 (two) times daily. 09/14/22   [provider]  clonazePAM (KLONOPIN) 0.5 MG tablet Take 0.5 mg by mouth at bedtime.     [provider]  cyclobenzaprine (FLEXERIL) 10 MG tablet Take 0.5-1 tablets (5-10 mg total) by mouth 3 (three) times daily as needed for muscle spasms (headache). Caution of sedation 03/30/23   Tower, Idamae Schuller A, MD  escitalopram (LEXAPRO) 20 MG tablet Take 30 mg by mouth daily. 03/07/23   Milagros Evener, MD  fluticasone [facility]) 50 MCG/ACT nasal spray SPRAY 2 SPRAYS INTO EACH NOSTRIL EVERY DAY 01/13/24   Tower, Audrie Gallus, MD  hyoscyamine (LEVSIN SL) 0.125 MG SL tablet Place 1 tablet (0.125 mg total) under the tongue every 6 (six) hours as needed for cramping. 01/14/24   Tower, Audrie Gallus, MD  levothyroxine (SYNTHROID) 25 MCG tablet Take 1 tablet (25 mcg total) by mouth daily. 09/23/23   Tower, Audrie Gallus, MD  megestrol (MEGACE) 40 MG tablet TAKE 1 TABLET (40 MG) BY MOUTH DAILY. CAN INCREASE TO 2 TABLETS A DAY IN THE EVENT OF HEAVY BLEEDING 12/01/22   Anyanwu, Jethro Bastos, MD  Multiple Vitamins-Minerals (CULTURELLE PROBIOTICS + MULTIV)  CHEW take 09/09/22   Dana Allan, MD  ondansetron (ZOFRAN) 8 MG tablet Take 1 tablet (8 mg total) by mouth every 8 (eight) hours as needed for nausea or vomiting (if phenergan does not work). 09/11/22   Tower, Audrie Gallus, MD  promethazine (PHENERGAN) 25 MG tablet Take 1 tablet (25 mg total) by mouth every 8 (eight) hours as needed for nausea or vomiting (if zofran does not work). 09/11/22   Tower, Audrie Gallus, MD  Suvorexant (BELSOMRA) 5 MG TABS Take by mouth at bedtime. Patient not taking: Reported on 12/03/2023    [provider]  valACYclovir (VALTREX) 1000 MG tablet Take 2 pills by mouth twice daily for 1 day for cold sore Patient not taking: Reported on 12/03/2023 07/05/23   Judy Pimple, MD    Current Outpatient Medications  Medication Sig  Dispense Refill   BALZIVA 0.4-35 MG-MCG tablet TAKE 1 TABLET BY MOUTH DAILY. TAKE CONTINUOUSLY 112 tablet 3   busPIRone (BUSPAR) 30 MG tablet Take 30 mg by mouth 2 (two) times daily.     clonazePAM (KLONOPIN) 0.5 MG tablet Take 0.5 mg by mouth at bedtime.      escitalopram (LEXAPRO) 20 MG tablet Take 30 mg by mouth daily.     fluticasone (FLONASE) 50 MCG/ACT nasal spray SPRAY 2 SPRAYS INTO EACH NOSTRIL EVERY DAY 48 mL 1   levothyroxine (SYNTHROID) 25 MCG tablet Take 1 tablet (25 mcg total) by mouth daily. 90 tablet 3   megestrol (MEGACE) 40 MG tablet TAKE 1 TABLET (40 MG) BY MOUTH DAILY. CAN INCREASE TO 2 TABLETS A DAY IN THE EVENT OF HEAVY BLEEDING 180 tablet 4   Multiple Vitamins-Minerals (CULTURELLE PROBIOTICS + MULTIV) CHEW take 30 tablet 1   acetaminophen (TYLENOL) 500 MG tablet Take 500 mg by mouth every 6 (six) hours as needed. For headache (Patient not taking: Reported on 12/03/2023)     acyclovir ointment (ZOVIRAX) 5 % Apply 1 Application topically every 3 (three) hours. To cold sore (Patient not taking: Reported on 12/03/2023) 15 g 3   albuterol (VENTOLIN HFA) 108 (90 Base) MCG/ACT inhaler Inhale 2 puffs into the lungs every 6 (six) hours as needed for wheezing or shortness of breath. (Patient not taking: Reported on 12/03/2023) 8 g 0   cyclobenzaprine (FLEXERIL) 10 MG tablet Take 0.5-1 tablets (5-10 mg total) by mouth 3 (three) times daily as needed for muscle spasms (headache). Caution of sedation 30 tablet 2   hyoscyamine (LEVSIN SL) 0.125 MG SL tablet Place 1 tablet (0.125 mg total) under the tongue every 6 (six) hours as needed for cramping. 90 tablet 3   LORazepam (ATIVAN) 0.5 MG tablet Take 0.5 mg by mouth every 6 (six) hours as needed.     ondansetron (ZOFRAN) 8 MG tablet Take 1 tablet (8 mg total) by mouth every 8 (eight) hours as needed for nausea or vomiting (if phenergan does not work). 20 tablet 0   promethazine (PHENERGAN) 25 MG tablet Take 1 tablet (25 mg total) by mouth every 8  (eight) hours as needed for nausea or vomiting (if zofran does not work). 20 tablet 0   Suvorexant (BELSOMRA) 5 MG TABS Take by mouth at bedtime. (Patient not taking: Reported on 12/03/2023)     valACYclovir (VALTREX) 1000 MG tablet Take 2 pills by mouth twice daily for 1 day for cold sore (Patient not taking: Reported on 12/03/2023) 36 tablet 0   Current Facility-Administered Medications  Medication Dose Route Frequency Provider Last Rate Last Admin  0.9 %  sodium chloride infusion  500 mL Intravenous Once Iva Boop, MD        Allergies as of 01/18/2024   (Not on File)    Family History  Problem Relation Age of Onset   Other Mother        Digestive problems-w/u @ [facility]-? bact inf   Irritable bowel syndrome Mother    ADD / ADHD Mother    Anxiety disorder Mother    Arthritis Mother    Depression Mother    Hypertension Mother    Obesity Mother    Hypertension Father        Not in contact with   Allergies Father    Heart disease Father    Alcohol abuse Father    Depression Father    Drug abuse Father    Arthritis Maternal Grandmother    Diabetes Maternal Grandfather    Cancer Paternal Grandfather        unsure type   ADD / ADHD Maternal Aunt    Anxiety disorder Maternal Aunt    Learning disabilities Maternal Uncle    Anxiety disorder Other        diffusely in family   Depression Other        diffusely in family   Cancer - Colon Neg Hx    Liver disease Neg Hx    Esophageal cancer Neg Hx     Social History   Socioeconomic History   Marital status: Single    Spouse name: Not on file   Number of children: 0   Years of education: Not on file   Highest education level: Master's degree (e.g., MA, MS, MEng, MEd, MSW, MBA)  Occupational History   Occupation: Clinical biochemist  Tobacco Use   Smoking status: Never   Smokeless tobacco: Never  Vaping Use   Vaping status: Never Used  Substance and Sexual Activity   Alcohol use: Not Currently    Comment: Very  rarely   Drug use: No   Sexual activity: Not Currently  Other Topics Concern   Not on file  Social History Narrative   Grad school at [employer] counseling      Now a counselor in a middle school      Has cats and involved in cat rescue      G0P0      Lives alone      Has run triathlon in the past      Vegetarian   Social Drivers of Health   Financial Resource Strain: Low Risk  (10/25/2023)   Overall Financial Resource Strain (CARDIA)    Difficulty of Paying Living Expenses: Not very hard  Food Insecurity: No Food Insecurity (10/25/2023)   Hunger Vital Sign    Worried About Running Out of Food in the Last Year: Never true    Ran Out of Food in the Last Year: Never true  Transportation Needs: No Transportation Needs (10/25/2023)   PRAPARE - Administrator, Civil Service (Medical): No    Lack of Transportation (Non-Medical): No  Physical Activity: Insufficiently Active (10/25/2023)   Exercise Vital Sign    Days of Exercise per Week: 3 days    Minutes of Exercise per Session: 30 min  Stress: Stress Concern Present (10/25/2023)   [employer] of Occupational Health - Occupational Stress Questionnaire    Feeling of Stress : To some extent  Social Connections: Moderately Integrated (10/25/2023)   Social Connection and Isolation Panel [NHANES]  Frequency of Communication with Friends and Family: Three times a week    Frequency of Social Gatherings with Friends and Family: Once a week    Attends Religious Services: More than 4 times per year    Active Member of [employer] or Organizations: Yes    Attends Engineer, structural: More than 4 times per year    Marital Status: Never married  Intimate Partner Violence: Not on file    Review of Systems:  All other review of systems negative except as mentioned in the HPI.  Physical Exam: Vital signs BP 111/68 (BP Location: Right Arm, Patient Position: Sitting, Cuff Size: Normal)   Pulse 73    Temp 97.9 F (36.6 C) (Temporal)   Ht 5\' 2"  (1.575 m)   Wt 182 lb (82.6 kg)   SpO2 98%   BMI 33.29 kg/m   General:   Alert,  Well-developed, well-nourished, pleasant and cooperative in NAD Lungs:  Clear throughout to auscultation.   Heart:  Regular rate and rhythm; no murmurs, clicks, rubs,  or gallops. Abdomen:  Soft, nontender and nondistended. Normal bowel sounds.   Neuro/Psych:  Alert and cooperative. Normal mood and affect. A and O x 3   @Carlisha Wisler  Tammie Fall, MD, [facility] [phone] (pager) 01/18/2024 8:56 AM@

## 2024-01-18 ENCOUNTER — Ambulatory Visit (AMBULATORY_SURGERY_CENTER): Payer: 59 | Admitting: Internal Medicine

## 2024-01-18 ENCOUNTER — Encounter: Payer: Self-pay | Admitting: Internal Medicine

## 2024-01-18 VITALS — BP 119/72 | HR 70 | Temp 97.9°F | Resp 13 | Ht 62.0 in | Wt 182.0 lb

## 2024-01-18 DIAGNOSIS — K6289 Other specified diseases of anus and rectum: Secondary | ICD-10-CM | POA: Diagnosis not present

## 2024-01-18 DIAGNOSIS — D122 Benign neoplasm of ascending colon: Secondary | ICD-10-CM

## 2024-01-18 DIAGNOSIS — K644 Residual hemorrhoidal skin tags: Secondary | ICD-10-CM | POA: Diagnosis not present

## 2024-01-18 DIAGNOSIS — Z1211 Encounter for screening for malignant neoplasm of colon: Secondary | ICD-10-CM | POA: Diagnosis present

## 2024-01-18 DIAGNOSIS — K5140 Inflammatory polyps of colon without complications: Secondary | ICD-10-CM

## 2024-01-18 MED ORDER — SODIUM CHLORIDE 0.9 % IV SOLN: 500.0000 mL | Freq: Once | INTRAVENOUS | Status: DC

## 2024-01-18 NOTE — Progress Notes (Signed)
 Vitals-DT  Pt's states no medical or surgical changes since previsit or office visit.

## 2024-01-18 NOTE — Patient Instructions (Addendum)
 I found and removed a very small polyp that looks benign. I will let you know pathology results and when to have another routine colonoscopy by mail and/or My Chart.  Your hemorrhoids were a little swollen - common with colonoscopy prep.  I appreciate the opportunity to care for you. Iva Boop, MD, FACG  YOU HAD AN ENDOSCOPIC PROCEDURE TODAY AT THE [facility]:   Refer to the procedure report that was given to you for any specific questions about what was found during the examination.  If the procedure report does not answer your questions, please call your gastroenterologist to clarify.  If you requested that your care partner not be given the details of your procedure findings, then the procedure report has been included in a sealed envelope for you to review at your convenience later.  YOU SHOULD EXPECT: Some feelings of bloating in the abdomen. Passage of more gas than usual.  Walking can help get rid of the air that was put into your GI tract during the procedure and reduce the bloating. If you had a lower endoscopy (such as a colonoscopy or flexible sigmoidoscopy) you may notice spotting of blood in your stool or on the toilet paper. If you underwent a bowel prep for your procedure, you may not have a normal bowel movement for a few days.  Please Note:  You might notice some irritation and congestion in your nose or some drainage.  This is from the oxygen used during your procedure.  There is no need for concern and it should clear up in a day or so.  SYMPTOMS TO REPORT IMMEDIATELY:  Following lower endoscopy (colonoscopy or flexible sigmoidoscopy):  Excessive amounts of blood in the stool  Significant tenderness or worsening of abdominal pains  Swelling of the abdomen that is new, acute  Fever of 100F or higher  For urgent or emergent issues, a gastroenterologist can be reached at any hour by calling [phone]. Do not use MyChart messaging for urgent concerns.     DIET:  We do recommend a small meal at first, but then you may proceed to your regular diet.  Drink plenty of fluids but you should avoid alcoholic beverages for 24 hours.  ACTIVITY:  You should plan to take it easy for the rest of today and you should NOT DRIVE or use heavy machinery until tomorrow (because of the sedation medicines used during the test).    FOLLOW UP: Our staff will call the number listed on your records the next business day following your procedure.  We will call around 7:15- 8:00 am to check on you and address any questions or concerns that you may have regarding the information given to you following your procedure. If we do not reach you, we will leave a message.     If any biopsies were taken you will be contacted by phone or by letter within the next 1-3 weeks.  Please call us at [phone] if you have not heard about the biopsies in 3 weeks.    SIGNATURES/CONFIDENTIALITY: You and/or your care partner have signed paperwork which will be entered into your electronic medical record.  These signatures attest to the fact that that the information above on your After Visit Summary has been reviewed and is understood.  Full responsibility of the confidentiality of this discharge information lies with you and/or your care-partner.

## 2024-01-18 NOTE — Progress Notes (Signed)
 Called to room to assist during endoscopic procedure.  Patient ID and intended procedure confirmed with present staff. Received instructions for my participation in the procedure from the performing physician.

## 2024-01-18 NOTE — Progress Notes (Signed)
 Sedate, gd SR, tolerated procedure well, VSS, report to RN

## 2024-01-18 NOTE — Op Note (Signed)
 [facility] Patient Name: Debra Edwards Procedure Date: 01/18/2024 8:56 AM MRN: [id number] Endoscopist: Iva Boop , MD, [id number] Age: 48 Referring MD:  Date of Birth: 16-Jan-1976 Gender: Female Account #: [id number] Procedure:                Colonoscopy Indications:              Screening for colorectal malignant neoplasm, Last                            colonoscopy: 2012 Medicines:                Monitored Anesthesia Care Procedure:                Pre-Anesthesia Assessment:                           - Prior to the procedure, a History and Physical                            was performed, and patient medications and                            allergies were reviewed. The patient's tolerance of                            previous anesthesia was also reviewed. The risks                            and benefits of the procedure and the sedation                            options and risks were discussed with the patient.                            All questions were answered, and informed consent                            was obtained. Prior Anticoagulants: The patient has                            taken no anticoagulant or antiplatelet agents. ASA                            Grade Assessment: II - A patient with mild systemic                            disease. After reviewing the risks and benefits,                            the patient was deemed in satisfactory condition to                            undergo the procedure.  After obtaining informed consent, the colonoscope                            was passed under direct vision. Throughout the                            procedure, the patient's blood pressure, pulse, and                            oxygen saturations were monitored continuously. The                            CF [id number] #[id number] was introduced through the anus                            and advanced to the the  cecum, identified by                            appendiceal orifice and ileocecal valve. The                            colonoscopy was performed without difficulty. The                            patient tolerated the procedure well. The quality                            of the bowel preparation was good. The ileocecal                            valve, appendiceal orifice, and rectum were                            photographed. The bowel preparation used was                            SUFLAVE via split dose instruction. Scope In: 9:04:23 AM Scope Out: 9:19:45 AM Scope Withdrawal Time: 0 hours 11 minutes 57 seconds  Total Procedure Duration: 0 hours 15 minutes 22 seconds  Findings:                 The perianal and digital rectal examinations were                            normal.                           A 3 mm polyp was found in the ascending colon. The                            polyp was sessile. The polyp was removed with a                            cold snare. Resection and retrieval were complete.  Verification of patient identification for the                            specimen was done. Estimated blood loss was minimal.                           External hemorrhoids were found. The hemorrhoids                            were small.                           Anal papilla(e) were hypertrophied.                           The exam was otherwise without abnormality on                            direct and retroflexion views. Complications:            No immediate complications. Estimated Blood Loss:     Estimated blood loss was minimal. Impression:               - One 3 mm polyp in the ascending colon, removed                            with a cold snare. Resected and retrieved.                           - External hemorrhoids.                           - Anal papilla(e) were hypertrophied.                           - The examination was otherwise  normal on direct                            and retroflexion views. Recommendation:           - Patient has a contact number available for                            emergencies. The signs and symptoms of potential                            delayed complications were discussed with the                            patient. Return to normal activities tomorrow.                            Written discharge instructions were provided to the                            patient.                           -  Resume previous diet.                           - Continue present medications.                           - Repeat colonoscopy is recommended. The                            colonoscopy date will be determined after pathology                            results from today's exam become available for                            review. Kenney Peacemaker, MD 01/18/2024 9:25:13 AM This report has been signed electronically.

## 2024-01-19 ENCOUNTER — Telehealth: Payer: Self-pay | Admitting: *Deleted

## 2024-01-19 NOTE — Telephone Encounter (Signed)
  Follow up Call-     01/18/2024    8:09 AM 01/18/2024    8:06 AM  Call back number  Post procedure Call Back phone  # [phone]   Permission to leave phone message  Yes     Patient questions:  Do you have a fever, pain , or abdominal swelling? No. Pain Score  0 *  Have you tolerated food without any problems? Yes.    Have you been able to return to your normal activities? Yes.    Do you have any questions about your discharge instructions: Diet   No. Medications  No. Follow up visit  No.  Do you have questions or concerns about your Care? No.  Actions: * If pain score is 4 or above: No action needed, pain <4.

## 2024-01-20 ENCOUNTER — Telehealth: Payer: Self-pay | Admitting: Internal Medicine

## 2024-01-20 LAB — SURGICAL PATHOLOGY

## 2024-01-20 NOTE — Telephone Encounter (Signed)
 Spoke with the pt and discussed her diarrhea.  She had colonoscopy on 4/15 and continues to have loose stools.  She is on bland diet since the procedure. She is not taking miralax. I did advise that she should go to her regular diet and add some fiber.  She will do that and let us   know if she does not get better.

## 2024-01-20 NOTE — Telephone Encounter (Signed)
 Inbound call from patient states she is having diarrhea. Requesting to speak with a nurse. Please advise.

## 2024-01-23 ENCOUNTER — Encounter: Payer: Self-pay | Admitting: Internal Medicine

## 2024-02-03 ENCOUNTER — Encounter: Payer: Self-pay | Admitting: Family Medicine

## 2024-02-03 DIAGNOSIS — E039 Hypothyroidism, unspecified: Secondary | ICD-10-CM

## 2024-02-04 ENCOUNTER — Encounter: Payer: Self-pay | Admitting: Family Medicine

## 2024-02-04 ENCOUNTER — Other Ambulatory Visit (INDEPENDENT_AMBULATORY_CARE_PROVIDER_SITE_OTHER)

## 2024-02-04 DIAGNOSIS — E039 Hypothyroidism, unspecified: Secondary | ICD-10-CM

## 2024-02-04 LAB — TSH: TSH: 5.34 u[IU]/mL (ref 0.35–5.50)

## 2024-03-03 ENCOUNTER — Telehealth: Payer: Self-pay | Admitting: *Deleted

## 2024-03-03 ENCOUNTER — Encounter: Payer: Self-pay | Admitting: Family Medicine

## 2024-03-03 NOTE — Telephone Encounter (Signed)
 I am currently out of town Follow up for appointment if symptoms do not improve   If worse over weekend-please give ER/ UC precautions

## 2024-03-03 NOTE — Telephone Encounter (Signed)
Sent mychart message letting pt know 

## 2024-03-03 NOTE — Telephone Encounter (Signed)
 Pt sent a message saying:  Good afternoon,   Last weekend I ate raw veggies from a veggie platter.  I ate veggies that had touched cucumbers, but I did not eat cucumbers.  On Monday evening, out of nowhere, I started having cramping and diarrhea.  I have used my IBS meds for the cramping, and have dropped to a more gentle diet, but I still have diarrhea.  It feels different than the usual IBS flare.  I haven't had a fever, nausea or vomiting, but wanted to check in about whether I should have any testing done for salmonella or for c-diff, or how long I should wait to have those tests.  I  hope you have a good weekend.

## 2024-03-13 ENCOUNTER — Other Ambulatory Visit: Payer: Self-pay | Admitting: Family Medicine

## 2024-03-13 DIAGNOSIS — Z1231 Encounter for screening mammogram for malignant neoplasm of breast: Secondary | ICD-10-CM

## 2024-03-14 ENCOUNTER — Ambulatory Visit: Admitting: Family Medicine

## 2024-05-03 ENCOUNTER — Ambulatory Visit
Admission: RE | Admit: 2024-05-03 | Discharge: 2024-05-03 | Disposition: A | Discharge status: Home / Self Care | Source: Ambulatory Visit | Attending: Family Medicine | Admitting: Family Medicine

## 2024-05-03 DIAGNOSIS — Z1231 Encounter for screening mammogram for malignant neoplasm of breast: Secondary | ICD-10-CM | POA: Diagnosis present

## 2024-05-08 ENCOUNTER — Ambulatory Visit: Payer: Self-pay | Admitting: Family Medicine

## 2024-06-07 ENCOUNTER — Encounter: Payer: Self-pay | Admitting: Family Medicine

## 2024-07-08 ENCOUNTER — Other Ambulatory Visit: Payer: Self-pay | Admitting: Family Medicine

## 2024-07-08 DIAGNOSIS — H6993 Unspecified Eustachian tube disorder, bilateral: Secondary | ICD-10-CM

## 2024-07-26 ENCOUNTER — Other Ambulatory Visit: Payer: Self-pay | Admitting: Family Medicine

## 2024-08-06 ENCOUNTER — Telehealth: Payer: Self-pay | Admitting: Family Medicine

## 2024-08-06 DIAGNOSIS — E039 Hypothyroidism, unspecified: Secondary | ICD-10-CM

## 2024-08-06 DIAGNOSIS — G2581 Restless legs syndrome: Secondary | ICD-10-CM

## 2024-08-06 DIAGNOSIS — Z0000 Encounter for general adult medical examination without abnormal findings: Secondary | ICD-10-CM

## 2024-08-06 NOTE — Telephone Encounter (Signed)
-----   Message from Veva JINNY Ferrari sent at 07/31/2024  2:46 PM EDT ----- Regarding: Lab orders forTue, 11.4.25 Patient is scheduled for CPX labs, please order future labs, Thanks , Veva

## 2024-08-08 ENCOUNTER — Other Ambulatory Visit (INDEPENDENT_AMBULATORY_CARE_PROVIDER_SITE_OTHER): Payer: BC Managed Care – PPO

## 2024-08-08 ENCOUNTER — Ambulatory Visit: Payer: Self-pay | Admitting: Family Medicine

## 2024-08-08 DIAGNOSIS — E039 Hypothyroidism, unspecified: Secondary | ICD-10-CM

## 2024-08-08 DIAGNOSIS — Z0000 Encounter for general adult medical examination without abnormal findings: Secondary | ICD-10-CM | POA: Diagnosis not present

## 2024-08-08 LAB — COMPREHENSIVE METABOLIC PANEL WITH GFR
ALT: 10 U/L (ref 0–35)
AST: 12 U/L (ref 0–37)
Albumin: 3.7 g/dL (ref 3.5–5.2)
Alkaline Phosphatase: 54 U/L (ref 39–117)
BUN: 10 mg/dL (ref 6–23)
CO2: 27 meq/L (ref 19–32)
Calcium: 8.6 mg/dL (ref 8.4–10.5)
Chloride: 102 meq/L (ref 96–112)
Creatinine, Ser: 0.83 mg/dL (ref 0.40–1.20)
GFR: 83.06 mL/min (ref >60.00)
Glucose, Bld: 77 mg/dL (ref 70–99)
Potassium: 4.1 meq/L (ref 3.5–5.1)
Sodium: 137 meq/L (ref 135–145)
Total Bilirubin: 0.5 mg/dL (ref 0.2–1.2)
Total Protein: 6.4 g/dL (ref 6.0–8.3)

## 2024-08-08 LAB — CBC WITH DIFFERENTIAL/PLATELET
Basophils Absolute: 0 K/uL (ref 0.0–0.1)
Basophils Relative: 0.4 % (ref 0.0–3.0)
Eosinophils Absolute: 0.1 K/uL (ref 0.0–0.7)
Eosinophils Relative: 1.2 % (ref 0.0–5.0)
HCT: 38.5 % (ref 36.0–46.0)
Hemoglobin: 13.1 g/dL (ref 12.0–15.0)
Lymphocytes Relative: 35.1 % (ref 12.0–46.0)
Lymphs Abs: 2.6 K/uL (ref 0.7–4.0)
MCHC: 34.1 g/dL (ref 30.0–36.0)
MCV: 88.3 fl (ref 78.0–100.0)
Monocytes Absolute: 0.5 K/uL (ref 0.1–1.0)
Monocytes Relative: 6.8 % (ref 3.0–12.0)
Neutro Abs: 4.1 K/uL (ref 1.4–7.7)
Neutrophils Relative %: 56.5 % (ref 43.0–77.0)
Platelets: 287 K/uL (ref 150.0–400.0)
RBC: 4.36 Mil/uL (ref 3.87–5.11)
RDW: 12.8 % (ref 11.5–15.5)
WBC: 7.3 K/uL (ref 4.0–10.5)

## 2024-08-08 LAB — LIPID PANEL
Cholesterol: 140 mg/dL (ref 0–200)
HDL: 57.5 mg/dL (ref >39.00)
LDL Cholesterol: 63 mg/dL (ref 0–99)
NonHDL: 82.6
Total CHOL/HDL Ratio: 2
Triglycerides: 96 mg/dL (ref 0.0–149.0)
VLDL: 19.2 mg/dL (ref 0.0–40.0)

## 2024-08-08 LAB — TSH: TSH: 5.23 u[IU]/mL (ref 0.35–5.50)

## 2024-08-15 ENCOUNTER — Encounter: Payer: Self-pay | Admitting: Family Medicine

## 2024-08-15 ENCOUNTER — Ambulatory Visit: Payer: BC Managed Care – PPO | Admitting: Family Medicine

## 2024-08-15 VITALS — BP 106/68 | HR 73 | Temp 97.9°F | Ht 62.5 in | Wt 191.1 lb

## 2024-08-15 DIAGNOSIS — E039 Hypothyroidism, unspecified: Secondary | ICD-10-CM

## 2024-08-15 DIAGNOSIS — Z23 Encounter for immunization: Secondary | ICD-10-CM

## 2024-08-15 DIAGNOSIS — Z0000 Encounter for general adult medical examination without abnormal findings: Secondary | ICD-10-CM

## 2024-08-15 DIAGNOSIS — F418 Other specified anxiety disorders: Secondary | ICD-10-CM | POA: Diagnosis not present

## 2024-08-15 DIAGNOSIS — Z1211 Encounter for screening for malignant neoplasm of colon: Secondary | ICD-10-CM

## 2024-08-15 DIAGNOSIS — M8580 Other specified disorders of bone density and structure, unspecified site: Secondary | ICD-10-CM

## 2024-08-15 MED ORDER — CYCLOBENZAPRINE HCL 10 MG PO TABS: 5.0000 mg | ORAL_TABLET | Freq: Three times a day (TID) | ORAL | 3 refills | Status: AC | PRN

## 2024-08-15 NOTE — Assessment & Plan Note (Signed)
 Colonoscopy 01/2024-did well with this

## 2024-08-15 NOTE — Progress Notes (Signed)
 Subjective:    Patient ID: Debra Edwards, female    DOB: 12-Feb-1976, 48 y.o.   MRN: [id number]  HPI  Here for health maintenance exam and to review chronic medical problems   Wt Readings from Last 3 Encounters:  08/15/24 191 lb 2 oz (86.7 kg)  01/18/24 182 lb (82.6 kg)  12/03/23 182 lb (82.6 kg)   34.40 kg/m  Vitals:   08/15/24 0807  BP: 106/68  Pulse: 73  Temp: 97.9 F (36.6 C)  SpO2: 100%    Immunization History  Administered Date(s) Administered   Influenza Inj Mdck Quad Pf 06/03/2018   Influenza Split 07/10/2011   Influenza Whole 06/07/2009, 06/16/2010   Influenza, Seasonal, Injecte, Preservative Fre 08/13/2015, 06/07/2021   Influenza,inj,Quad PF,6+ Mos 07/08/2016, 06/17/2017, 06/23/2019   Influenza-Unspecified 07/06/2014, 07/03/2020, 06/05/2022, 07/30/2023, 07/22/2024   Moderna Covid-19 Fall Seasonal Vaccine 43yrs & older 06/18/2023, 06/09/2024   Moderna Covid-19 Vaccine Bivalent Booster 50yrs & up 06/20/2021, 07/03/2022   Moderna Sars-Covid-2 Vaccination 12/02/2019, 12/30/2019, 07/26/2020, 03/15/2021   PNEUMOCOCCAL CONJUGATE-20 08/15/2024   Td 03/05/2006, 12/03/2010   Tdap 12/14/2020    Health Maintenance Due  Topic Date Due   HIV Screening  Never done   Hepatitis C Screening  Never done   A lot of life changes  Good and bad  Hormone changes are rough  Is interested in pneumonia vaccine   Mammogram 04/2024  Self breast exam- no lumps /but does not do often   Gyn health Pap 04/2022 Ascus with neg HPv Sees gyn Dr Herchel [facility] continuously  Also megestrol  to prevent breakthrough bleeding (works)  Hot flashes/night sweats  Will follow up this summer    Colon cancer screening  Colonoscopy 01/2024   Bone health  Suspected osteopenia by xray  No dexa due to deductable  Falls-one in December 2 y ago [employer]  Last vitamin D  Lab Results  Component Value Date   VD25OH 85.32 08/10/2023    Exercise  Gets  motivated and exercises for a while Then if she gets sick will stop and regress  Also muscle spasms in neck on/off  Tries to walk up to 4 mi with her neighbor  (hills are hard)  Has elliptical Some stretching with u tube videos also -helps stiffness   Program- weight lifting for seniors on/off     Mood    08/15/2024    8:11 AM 08/10/2023    1:56 PM 08/03/2022    4:31 PM 07/30/2021    3:29 PM 07/23/2020    4:17 PM  Depression screen PHQ 2/9  Decreased Interest 1 0 1 1 1   Down, Depressed, Hopeless 1 1 1 1 2   PHQ - 2 Score 2 1 2 2 3   Altered sleeping 2 3 2 1  0  Tired, decreased energy 2 3 3 1 1   Change in appetite 2 1 3 3 3   Feeling bad or failure about yourself  0 2 2 2 2   Trouble concentrating 0 0 2 1 2   Moving slowly or fidgety/restless 0 0 3 0 0  Suicidal thoughts 0 0 0 0 0  PHQ-9 Score 8 10  17  10  11    Difficult doing work/chores Somewhat difficult Very difficult  Very difficult      Data saved with a previous flowsheet row definition   Under psychiatric care  Anxious depression and PTSD Klonopin, buspar, lexapro belsomra, ativan  Esketamine  (new, is helping quite a bit)   Lost step father to  colon cancer-this was hard    Hypothyroidism  Pt has no clinical changes No change in energy level/ hair or skin/ edema and no tremor Lab Results  Component Value Date   TSH 5.23 08/08/2024    Levothyroxine  25 mcg daily   Cholesterol Lab Results  Component Value Date   CHOL 140 08/08/2024   CHOL 130 07/30/2023   CHOL 156 08/03/2022   Lab Results  Component Value Date   HDL 57.50 08/08/2024   HDL 58.60 07/30/2023   HDL 64.90 08/03/2022   Lab Results  Component Value Date   LDLCALC 63 08/08/2024   LDLCALC 47 07/30/2023   LDLCALC 66 08/03/2022   Lab Results  Component Value Date   TRIG 96.0 08/08/2024   TRIG 118.0 07/30/2023   TRIG 126.0 08/03/2022   Lab Results  Component Value Date   CHOLHDL 2 08/08/2024   CHOLHDL 2 07/30/2023   CHOLHDL 2  08/03/2022   No results found for: LDLDIRECT  Was not eating as well/eating out of a vending machine when anxious     Lab Results  Component Value Date   ALT 10 08/08/2024   AST 12 08/08/2024   ALKPHOS 54 08/08/2024   BILITOT 0.5 08/08/2024   Lab Results  Component Value Date   NA 137 08/08/2024   K 4.1 08/08/2024   CO2 27 08/08/2024   GLUCOSE 77 08/08/2024   BUN 10 08/08/2024   CREATININE 0.83 08/08/2024   CALCIUM 8.6 08/08/2024   GFR 83.06 08/08/2024   GFRNONAA 86.84 07/02/2010   Lab Results  Component Value Date   WBC 7.3 08/08/2024   HGB 13.1 08/08/2024   HCT 38.5 08/08/2024   MCV 88.3 08/08/2024   PLT 287.0 08/08/2024    Needs refill of flexeril  for occational neck muscle spasms   Patient Active Problem List   Diagnosis Date Noted   Left knee pain 10/26/2023   Osteopenia determined by x-ray 10/26/2023   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   Hypothyroid 08/10/2023   History of Clostridium difficile colitis 09/16/2022   Perimenopause 01/26/2022   Colon cancer screening 07/30/2021   Mild reactive airways disease 05/15/2019   Motion sickness 04/20/2018   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   Routine general medical examination at a health care facility 10/27/2012   Migraine headache 06/24/2012   GERD (gastroesophageal reflux disease) 02/19/2012   IBS (irritable bowel syndrome) 04/14/2011   OBSTRUCTIVE SLEEP APNEA 09/05/2010   BRUXISM 07/24/2010   RESTLESS LEG SYNDROME 07/24/2010   Allergic rhinitis 12/17/2009   PTSD 10/20/2007   FIBROCYSTIC BREAST DISEASE 10/20/2007   DYSMENORRHEA 10/20/2007   Past Medical History:  Diagnosis Date   Acid reflux    occasional   Allergic rhinitis, seasonal    vasomotor rhinitis   Allergy 2021   some sneezing, itchy/watery eyes when pollen is high   Anemia in the past   is a blood donor   Anxiety    Constipation, chronic    Depression    Dysmenorrhea    GERD (gastroesophageal reflux disease)    Heart murmur     Negative echo in the past   History of chicken pox    Hypothyroid 08/10/2023   IBS (irritable bowel syndrome)    Migraines    OSA (obstructive sleep apnea)    PSG 08/20/10 RDI 18.7   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 bact inf   Irritable bowel syndrome Mother    ADD / ADHD Mother    Anxiety disorder Mother    Arthritis Mother    Depression Mother    Hypertension Mother    Obesity Mother    Hearing loss Mother    Hypertension Father        Not in contact with   Allergies Father    Heart disease Father    Alcohol abuse Father    Depression Father    Drug abuse Father    ADD / ADHD Maternal Aunt    Anxiety disorder Maternal Aunt    Learning disabilities Maternal Uncle    Arthritis Maternal Grandmother    Diabetes Maternal Grandfather    Cancer Paternal Grandfather        unsure type   Anxiety disorder Other        diffusely in family   Depression Other        diffusely in family   Cancer - Colon Neg Hx    Liver disease Neg Hx    Esophageal cancer Neg Hx    Breast cancer Neg Hx    No Known Allergies Current Outpatient Medications on File Prior to Visit  Medication Sig Dispense Refill   acetaminophen (TYLENOL) 500 MG tablet Take 500 mg by mouth every 6 (six) hours as needed. For headache     acyclovir  ointment (ZOVIRAX ) 5 % Apply 1 Application topically every 3 (three) hours.  To cold sore 15 g 3   albuterol  (VENTOLIN  HFA) 108 (90 Base) MCG/ACT inhaler Inhale 2 puffs into the lungs every 6 (six) hours as needed for wheezing or shortness of breath. 8 g 0   busPIRone (BUSPAR) 30 MG tablet Take 30 mg by mouth 2 (two) times daily.     clonazePAM (KLONOPIN) 0.5 MG tablet Take 0.5 mg by mouth at bedtime.      escitalopram (LEXAPRO) 20 MG tablet Take 30 mg by mouth daily.     Esketamine HCl, 84 MG Dose, (SPRAVATO, 84 MG DOSE,) 28 MG/DEVICE SOPK Place 84 mg into the nose. Every 2-3 weeks as needed     fluticasone  (FLONASE ) 50 MCG/ACT nasal spray SPRAY 2 SPRAYS INTO EACH NOSTRIL EVERY DAY 48 mL 1   hyoscyamine  (LEVSIN  SL) 0.125 MG SL tablet Place 1 tablet (0.125 mg total) under the tongue every 6 (six) hours as needed for cramping. 90 tablet 3   levothyroxine  (SYNTHROID ) 25 MCG tablet Take 1 tablet (25 mcg total) by mouth daily. 90 tablet 3   LORazepam (ATIVAN) 0.5 MG tablet Take 0.5 mg by mouth every 6 (six) hours as needed.     megestrol  (MEGACE ) 40 MG tablet TAKE 1 TABLET (40 MG) BY MOUTH DAILY. CAN INCREASE TO 2 TABLETS A DAY IN THE EVENT OF HEAVY BLEEDING 180 tablet 4   Multiple Vitamins-Minerals (CULTURELLE PROBIOTICS + MULTIV) CHEW  take 30 tablet 1   norethindrone -ethinyl estradiol  (BALZIVA ) 0.4-35 MG-MCG tablet TAKE 1 TABLET BY MOUTH DAILY. TAKE CONTINUOUSLY 112 tablet 0   ondansetron  (ZOFRAN ) 8 MG tablet Take 1 tablet (8 mg total) by mouth every 8 (eight) hours as needed for nausea or vomiting (if phenergan  does not work). 20 tablet 0   promethazine  (PHENERGAN ) 25 MG tablet Take 1 tablet (25 mg total) by mouth every 8 (eight) hours as needed for nausea or vomiting (if zofran  does not work). 20 tablet 0   Suvorexant (BELSOMRA) 5 MG TABS Take by mouth at bedtime.     valACYclovir  (VALTREX ) 1000 MG tablet Take 2 pills by mouth twice daily for 1 day for cold sore 36 tablet 0   No current facility-administered medications on file prior to visit.    Review of Systems   Constitutional:  Positive for fatigue. Negative for activity change, appetite change, fever and unexpected weight change.  HENT:  Negative for congestion, ear pain, rhinorrhea, sinus pressure and sore throat.   Eyes:  Negative for pain, redness and visual disturbance.  Respiratory:  Negative for cough, shortness of breath and wheezing.   Cardiovascular:  Negative for chest pain and palpitations.  Gastrointestinal:  Negative for abdominal pain, blood in stool, constipation and diarrhea.  Endocrine: Negative for polydipsia and polyuria.  Genitourinary:  Negative for dysuria, frequency and urgency.  Musculoskeletal:  Negative for arthralgias, back pain and myalgias.  Skin:  Negative for pallor and rash.  Allergic/Immunologic: Negative for environmental allergies.  Neurological:  Negative for dizziness, syncope and headaches.  Hematological:  Negative for adenopathy. Does not bruise/bleed easily.  Psychiatric/Behavioral:  Positive for dysphoric mood. Negative for decreased concentration. The patient is nervous/anxious.        Objective:   Physical Exam Constitutional:      General: She is not in acute distress.    Appearance: Normal appearance. She is well-developed. She is obese. She is not ill-appearing or diaphoretic.  HENT:     Head: Normocephalic and atraumatic.     Right Ear: Tympanic membrane, ear canal and external ear normal.     Left Ear: Tympanic membrane, ear canal and external ear normal.     Nose: Nose normal. No congestion.     Mouth/Throat:     Mouth: Mucous membranes are moist.     Pharynx: Oropharynx is clear. No posterior oropharyngeal erythema.  Eyes:     General: No scleral icterus.    Extraocular Movements: Extraocular movements intact.     Conjunctiva/sclera: Conjunctivae normal.     Pupils: Pupils are equal, round, and reactive to light.  Neck:     Thyroid : No thyromegaly.     Vascular: No carotid bruit or JVD.  Cardiovascular:     Rate and Rhythm: Normal  rate and regular rhythm.     Pulses: Normal pulses.     Heart sounds: Normal heart sounds.     No gallop.  Pulmonary:     Effort: Pulmonary effort is normal. No respiratory distress.     Breath sounds: Normal breath sounds. No wheezing.     Comments: Good air exch Chest:     Chest wall: No tenderness.  Abdominal:     General: Bowel sounds are normal. There is no distension or abdominal bruit.     Palpations: Abdomen is soft. There is no mass.     Tenderness: There is no abdominal tenderness.     Hernia: No hernia is present.  Genitourinary:  Comments: Breast exam: No mass, nodules, thickening, tenderness, bulging, retraction, inflamation, nipple discharge or skin changes noted.  No axillary or clavicular LA.     Musculoskeletal:        General: No tenderness. Normal range of motion.     Cervical back: Normal range of motion and neck supple. No rigidity. No muscular tenderness.     Right lower leg: No edema.     Left lower leg: No edema.     Comments: No kyphosis   Lymphadenopathy:     Cervical: No cervical adenopathy.  Skin:    General: Skin is warm and dry.     Coloration: Skin is not pale.     Findings: No erythema or rash.     Comments: Solar lentigines diffusely   Neurological:     Mental Status: She is alert. Mental status is at baseline.     Cranial Nerves: No cranial nerve deficit.     Motor: No abnormal muscle tone.     Coordination: Coordination normal.     Gait: Gait normal.     Deep Tendon Reflexes: Reflexes are normal and symmetric. Reflexes normal.  Psychiatric:        Mood and Affect: Mood normal.        Cognition and Memory: Cognition and memory normal.     Comments: Candidly discusses symptoms and stressors              Assessment & Plan:   Problem List Items Addressed This Visit       Endocrine   Hypothyroid   Lab Results  Component Value Date   TSH 5.23 08/08/2024   No clinical changes  Plan to continue levothyroxine  25 mcg daily          Musculoskeletal and Integument   Osteopenia determined by x-ray   Pt is unable to afford deductible for a dexa at this time unfortunately  One fall No fractures  Taking vit D  .bneh         Other   Routine general medical examination at a health care facility - Primary   Reviewed health habits including diet and exercise and skin cancer prevention Reviewed appropriate screening tests for age  Also reviewed health mt list, fam hx and immunization status , as well as social and family history   See HPI Labs reviewed and ordered Health Maintenance  Topic Date Due   HIV Screening  Never done   Hepatitis C Screening  Never done   COVID-19 Vaccine (9 - Moderna risk 2025-26 season) 12/07/2024   Breast Cancer Screening  05/03/2025   Pap with HPV screening  04/15/2027   DTaP/Tdap/Td vaccine (4 - Td or Tdap) 12/15/2030   Colon Cancer Screening  01/17/2034   Flu Shot  Completed   Hepatitis B Vaccine  Completed   Pneumococcal Vaccine  Aged Out   HPV Vaccine  Aged Out   Meningitis B Vaccine  Aged Out    Prevnar 20 vaccine today  Colonoscopy utd  Discussed fall prevention, supplements and exercise for bone density  No fractures  Unable to get dexa due to high deductible unfortunately  PHQ 8 in treatment       Colon cancer screening   Colonoscopy 01/2024-did well with this       Anxious depression   Continues psychiatric care   Klonopin, buspar, lexapro belsomra, ativan  Esketamine  (new, is helping quite a bit) -thankfully       Relevant Medications  Esketamine HCl, 84 MG Dose, (SPRAVATO, 84 MG DOSE,) 28 MG/DEVICE SOPK   Other Visit Diagnoses       Need for pneumococcal 20-valent conjugate vaccination       Relevant Orders   Pneumococcal conjugate vaccine 20-valent (Prevnar 20) (Completed)

## 2024-08-15 NOTE — Assessment & Plan Note (Signed)
 Continues psychiatric care   Klonopin, buspar, lexapro belsomra, ativan  Esketamine  (new, is helping quite a bit) -thankfully

## 2024-08-15 NOTE — Assessment & Plan Note (Signed)
 Lab Results  Component Value Date   TSH 5.23 08/08/2024   No clinical changes  Plan to continue levothyroxine  25 mcg daily

## 2024-08-15 NOTE — Patient Instructions (Addendum)
 Stay as active as you can be   Keep up the strength training to your routine, this is important for bone and brain health and can reduce your risk of falls and help your body use insulin properly and regulate weight  Light weights, exercise bands , and internet videos are a good way to start  Yoga (chair or regular), machines , floor exercises or a gym with machines are also good options    Pneumonia vaccine today   Take care of yourself

## 2024-08-15 NOTE — Assessment & Plan Note (Signed)
 Reviewed health habits including diet and exercise and skin cancer prevention Reviewed appropriate screening tests for age  Also reviewed health mt list, fam hx and immunization status , as well as social and family history   See HPI Labs reviewed and ordered Health Maintenance  Topic Date Due   HIV Screening  Never done   Hepatitis C Screening  Never done   COVID-19 Vaccine (9 - Moderna risk 2025-26 season) 12/07/2024   Breast Cancer Screening  05/03/2025   Pap with HPV screening  04/15/2027   DTaP/Tdap/Td vaccine (4 - Td or Tdap) 12/15/2030   Colon Cancer Screening  01/17/2034   Flu Shot  Completed   Hepatitis B Vaccine  Completed   Pneumococcal Vaccine  Aged Out   HPV Vaccine  Aged Out   Meningitis B Vaccine  Aged Out    Prevnar 20 vaccine today  Colonoscopy utd  Discussed fall prevention, supplements and exercise for bone density  No fractures  Unable to get dexa due to high deductible unfortunately  PHQ 8 in treatment

## 2024-08-15 NOTE — Assessment & Plan Note (Signed)
 Pt is unable to afford deductible for a dexa at this time unfortunately  One fall No fractures  Taking vit D  .bneh

## 2024-08-20 ENCOUNTER — Encounter: Payer: Self-pay | Admitting: Family Medicine

## 2024-08-21 ENCOUNTER — Encounter: Payer: Self-pay | Admitting: Family Medicine

## 2024-08-21 ENCOUNTER — Ambulatory Visit: Admitting: Family Medicine

## 2024-08-21 VITALS — BP 110/70 | HR 75 | Temp 97.9°F | Ht 62.5 in | Wt 195.0 lb

## 2024-08-21 DIAGNOSIS — B001 Herpesviral vesicular dermatitis: Secondary | ICD-10-CM

## 2024-08-21 DIAGNOSIS — T881XXA Other complications following immunization, not elsewhere classified, initial encounter: Secondary | ICD-10-CM | POA: Insufficient documentation

## 2024-08-21 MED ORDER — ACYCLOVIR 5 % EX OINT
1.0000 | TOPICAL_OINTMENT | CUTANEOUS | 3 refills | Status: AC
Start: 2024-08-21 — End: ?

## 2024-08-21 MED ORDER — TRIAMCINOLONE ACETONIDE 0.1 % EX CREA: 1.0000 | TOPICAL_CREAM | Freq: Two times a day (BID) | CUTANEOUS | 0 refills | Status: AC | PRN

## 2024-08-21 NOTE — Assessment & Plan Note (Signed)
 Refilled acyclovir  today which is helpful / ointment prn use

## 2024-08-21 NOTE — Assessment & Plan Note (Addendum)
 To prevnar 20 vaccine  Right arm- patchy erythema (whelps) with itching and mild swelling  Did improve with benadryl and some triamcinolone cream  Reassuring exam  Instructed to alternate warm and cool compress Antihistamine prn -see AVS Triamcinolone cream prescription  Watch for increase redness/pain/swelling or any other symptoms like fever or wheezing   Update if not starting to improve in a week or if worsening  Call back and Er precautions noted in detail today

## 2024-08-21 NOTE — Progress Notes (Signed)
 Subjective:    Patient ID: Debra Edwards, female    DOB: August 18, 1976, 48 y.o.   MRN: [id number]  HPI  Wt Readings from Last 3 Encounters:  08/21/24 195 lb (88.5 kg)  08/15/24 191 lb 2 oz (86.7 kg)  01/18/24 182 lb (82.6 kg)   35.10 kg/m  Vitals:   08/21/24 1152  BP: 110/70  Pulse: 75  Temp: 97.9 F (36.6 C)  SpO2: 98%    Pt presents for  Reaction to prevnar 20 vaccine   Friday -itching around area of vaccine  Sat red and itching Swollen and patchy   Took benadryl  It helped a bit   Tried a cream / triamcinolone (0.1%) from a friend this am   No fever No mouth/throat swelling or wheezing  Feels fine     Patient Active Problem List   Diagnosis Date Noted   Local reaction to immunization 08/21/2024   Left knee pain 10/26/2023   Osteopenia determined by x-ray 10/26/2023   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   Hypothyroid 08/10/2023   History of Clostridium difficile colitis 09/16/2022   Perimenopause 01/26/2022   Colon cancer screening 07/30/2021   Mild reactive airways disease 05/15/2019   Motion sickness 04/20/2018   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   Routine general medical examination at a health care facility 10/27/2012   Migraine headache 06/24/2012   GERD (gastroesophageal reflux disease) 02/19/2012   IBS (irritable bowel syndrome) 04/14/2011   OBSTRUCTIVE SLEEP APNEA 09/05/2010   BRUXISM 07/24/2010   RESTLESS LEG SYNDROME 07/24/2010   Allergic rhinitis 12/17/2009   PTSD 10/20/2007   FIBROCYSTIC BREAST DISEASE 10/20/2007   DYSMENORRHEA 10/20/2007   Past Medical History:  Diagnosis Date   Acid reflux    occasional   Allergic rhinitis, seasonal    vasomotor rhinitis   Allergy 2021   some sneezing, itchy/watery eyes when pollen is high   Anemia in the past   is a blood donor   Anxiety    Constipation, chronic    Depression    Dysmenorrhea    GERD (gastroesophageal reflux disease)    Heart murmur    Negative echo in the past    History of chicken pox    Hypothyroid 08/10/2023   IBS (irritable bowel syndrome)    Migraines    OSA (obstructive sleep apnea)    PSG 08/20/10 RDI 18.7   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 bact inf   Irritable bowel syndrome Mother    ADD / ADHD Mother    Anxiety disorder Mother    Arthritis Mother    Depression Mother    Hypertension Mother    Obesity Mother    Hearing loss Mother    Hypertension Father  Not in contact with   Allergies Father    Heart disease Father    Alcohol abuse Father    Depression Father    Drug abuse Father    ADD / ADHD Maternal Aunt    Anxiety disorder Maternal Aunt    Learning disabilities Maternal Uncle    Arthritis Maternal Grandmother    Diabetes Maternal Grandfather    Cancer Paternal Grandfather        unsure type   Anxiety disorder Other        diffusely in family   Depression Other        diffusely in family   Cancer - Colon Neg Hx    Liver disease Neg Hx    Esophageal cancer Neg Hx    Breast cancer Neg Hx    Allergies  Allergen Reactions   Prevnar 20 [Pneumococcal 20-Val Conj Vacc]     Local reaction    Current Outpatient Medications on File Prior to Visit  Medication Sig Dispense Refill   acetaminophen (TYLENOL) 500 MG tablet Take 500 mg by mouth every 6 (six) hours as needed. For headache     albuterol  (VENTOLIN  HFA) 108 (90  Base) MCG/ACT inhaler Inhale 2 puffs into the lungs every 6 (six) hours as needed for wheezing or shortness of breath. 8 g 0   busPIRone (BUSPAR) 30 MG tablet Take 30 mg by mouth 2 (two) times daily.     clonazePAM (KLONOPIN) 0.5 MG tablet Take 0.5 mg by mouth at bedtime.      cyclobenzaprine  (FLEXERIL ) 10 MG tablet Take 0.5-1 tablets (5-10 mg total) by mouth 3 (three) times daily as needed for muscle spasms (headache). Caution of sedation 30 tablet 3   escitalopram (LEXAPRO) 20 MG tablet Take 30 mg by mouth daily.     Esketamine HCl, 84 MG Dose, (SPRAVATO, 84 MG DOSE,) 28 MG/DEVICE SOPK Place 84 mg into the nose. Every 2-3 weeks as needed     fluticasone  (FLONASE ) 50 MCG/ACT nasal spray SPRAY 2 SPRAYS INTO EACH NOSTRIL EVERY DAY 48 mL 1   hyoscyamine  (LEVSIN  SL) 0.125 MG SL tablet Place 1 tablet (0.125 mg total) under the tongue every 6 (six) hours as needed for cramping. 90 tablet 3   levothyroxine  (SYNTHROID ) 25 MCG tablet Take 1 tablet (25 mcg total) by mouth daily. 90 tablet 3   LORazepam (ATIVAN) 0.5 MG tablet Take 0.5 mg by mouth every 6 (six) hours as needed.     megestrol  (MEGACE ) 40 MG tablet TAKE 1 TABLET (40 MG) BY MOUTH DAILY. CAN INCREASE TO 2 TABLETS A DAY IN THE EVENT OF HEAVY BLEEDING 180 tablet 4   Multiple Vitamins-Minerals (CULTURELLE PROBIOTICS + MULTIV) CHEW take 30 tablet 1   norethindrone -ethinyl estradiol  (BALZIVA ) 0.4-35 MG-MCG tablet TAKE 1 TABLET BY MOUTH DAILY. TAKE CONTINUOUSLY 112 tablet 0   ondansetron  (ZOFRAN ) 8 MG tablet Take 1 tablet (8 mg total) by mouth every 8 (eight) hours as needed for nausea or vomiting (if phenergan  does not work). 20 tablet 0   promethazine  (PHENERGAN ) 25 MG tablet Take 1 tablet (25 mg total) by mouth every 8 (eight) hours as needed for nausea or vomiting (if zofran  does not work). 20 tablet 0   Suvorexant (BELSOMRA) 5 MG TABS Take by mouth at bedtime.     valACYclovir  (VALTREX ) 1000 MG tablet Take 2 pills by mouth twice daily for 1 day for  cold sore 36 tablet 0   No current facility-administered medications on file prior to  visit.    Review of Systems  Constitutional:  Negative for activity change, appetite change, fatigue, fever and unexpected weight change.  HENT:  Negative for congestion, ear pain, rhinorrhea, sinus pressure and sore throat.   Eyes:  Negative for pain, redness and visual disturbance.  Respiratory:  Negative for cough, chest tightness, shortness of breath, wheezing and stridor.   Cardiovascular:  Negative for chest pain and palpitations.  Gastrointestinal:  Negative for abdominal pain, blood in stool, constipation and diarrhea.  Endocrine: Negative for polydipsia and polyuria.  Genitourinary:  Negative for dysuria, frequency and urgency.  Musculoskeletal:  Negative for arthralgias, back pain and myalgias.  Skin:  Positive for color change. Negative for pallor and rash.       Redness and itching of right upper arm  Allergic/Immunologic: Negative for environmental allergies.  Neurological:  Negative for dizziness, syncope and headaches.  Hematological:  Negative for adenopathy. Does not bruise/bleed easily.  Psychiatric/Behavioral:  Negative for decreased concentration and dysphoric mood. The patient is not nervous/anxious.        Objective:   Physical Exam Constitutional:      General: She is not in acute distress.    Appearance: Normal appearance. She is obese. She is not ill-appearing or diaphoretic.  Eyes:     Conjunctiva/sclera: Conjunctivae normal.     Pupils: Pupils are equal, round, and reactive to light.  Cardiovascular:     Rate and Rhythm: Regular rhythm.  Pulmonary:     Effort: Pulmonary effort is normal. No respiratory distress.     Breath sounds: No wheezing.  Skin:    General: Skin is warm and dry.     Comments: Right upper arm  Patchy erythema and induration resembling whelps from shoulder to near elbow Warm to touch Mildly swollen No open areas  Non tender  Normal rom of  arm and wrist    Neurological:     Mental Status: She is alert.  Psychiatric:        Mood and Affect: Mood normal.           Assessment & Plan:   Problem List Items Addressed This Visit       Digestive   Recurrent cold sores   Refilled acyclovir  today which is helpful / ointment prn use       Relevant Medications   acyclovir  ointment (ZOVIRAX ) 5 %     Other   Local reaction to immunization - Primary   To prevnar 20 vaccine  Right arm- patchy erythema (whelps) with itching and mild swelling  Did improve with benadryl and some triamcinolone cream  Reassuring exam  Instructed to alternate warm and cool compress Antihistamine prn -see AVS Triamcinolone cream prescription  Watch for increase redness/pain/swelling or any other symptoms like fever or wheezing   Update if not starting to improve in a week or if worsening  Call back and Er precautions noted in detail today

## 2024-08-21 NOTE — Patient Instructions (Addendum)
 Keep area of reaction clean (soap and water)  Intermittent warm and cool compresses are helpful   If needed an antihistamine should help (benadryl or claritin)   Try the triamcinolone cream as needed   If symptoms worsen -increased redness/swelling/pain=let us  know  Or if fever or any other symptoms, please call

## 2024-08-21 NOTE — Telephone Encounter (Signed)
 Called patient states that area has gotten larger but middle has calmed a little with otc treatment. I have set up visit today with Dr. Randeen to be seen. If any changes before appointment or any red words that I have reviewed and patient repeated back to me she will be seen at ED.

## 2024-09-21 ENCOUNTER — Other Ambulatory Visit: Payer: Self-pay | Admitting: Family Medicine

## 2024-09-24 ENCOUNTER — Encounter: Payer: Self-pay | Admitting: Family Medicine

## 2024-09-24 ENCOUNTER — Encounter: Payer: Self-pay | Admitting: Emergency Medicine

## 2024-09-24 ENCOUNTER — Ambulatory Visit
Admission: EM | Admit: 2024-09-24 | Discharge: 2024-09-24 | Disposition: A | Discharge status: Home / Self Care | Attending: Emergency Medicine | Admitting: Emergency Medicine

## 2024-09-24 DIAGNOSIS — J020 Streptococcal pharyngitis: Secondary | ICD-10-CM | POA: Diagnosis not present

## 2024-09-24 DIAGNOSIS — J029 Acute pharyngitis, unspecified: Secondary | ICD-10-CM | POA: Diagnosis not present

## 2024-09-24 LAB — POCT RAPID STREP A (OFFICE): Rapid Strep A Screen: POSITIVE — AB

## 2024-09-24 MED ORDER — CEFDINIR 300 MG PO CAPS: 300.0000 mg | ORAL_CAPSULE | Freq: Two times a day (BID) | ORAL | 0 refills | Status: DC

## 2024-09-24 MED ORDER — CEFDINIR 125 MG/5ML PO SUSR: 300.0000 mg | Freq: Two times a day (BID) | ORAL | 0 refills | Status: AC

## 2024-09-24 NOTE — Discharge Instructions (Addendum)
 Your rapid strep test today was positive  Take cefdinir  twice a day for the next 7 days, daily will see improvement in about 48 hours and steady progression from there  May  use of salt gargles throat lozenges, warm liquids, teaspoons of honey and over-the-counter clippers septic spray for comfort  May give Tylenol or Motrin every 6 hours as needed for additional comfort  Change toothbrush after completion of antibiotic  You may follow-up at urgent care as needed

## 2024-09-24 NOTE — ED Provider Notes (Signed)
 " [facility]    CSN: [id number] Arrival date & time: 09/24/24  9061      History   Chief Complaint Chief Complaint  Patient presents with   Sore Throat   Nasal Congestion    HPI Debra Edwards is a 48 y.o. female.   Patient presents for evaluation of nasal congestion and a nonproductive cough present for 2 weeks.  Over the last 24 hours has begun to experience increased fatigue, postnasal drip, a severe sore throat, pain with swelling and significant gland swelling interfering with sleep.  Painful to swallow but tolerable to food and liquids.  Has attempted use of salt water gargles and hot tea this morning without improvement.  Positive for COVID-19 on 12 7, works in a school system.  Denies shortness of breath or wheezing.   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    possibly from [place] a farm   PTSD (post-traumatic stress disorder)    with depressive symptoms   Sleep apnea    Urticaria    stress induced    Patient Active Problem List   Diagnosis Date Noted   Local reaction to immunization 08/21/2024   Left knee pain 10/26/2023   Osteopenia determined by x-ray 10/26/2023   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   Hypothyroid 08/10/2023   History of Clostridium difficile colitis 09/16/2022   Perimenopause 01/26/2022   Colon cancer screening 07/30/2021   Mild reactive airways disease 05/15/2019    Motion sickness 04/20/2018   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      Home Medications    Prior to Admission medications  Medication Sig Start Date End Date Taking? Authorizing Provider  acetaminophen (TYLENOL) 500 MG tablet Take 500 mg by mouth every 6 (six) hours as needed. For headache    [provider]  acyclovir  ointment (ZOVIRAX ) 5 % Apply 1 Application topically every 3 (three) hours. To cold sore 08/21/24   Tower, Laine LABOR, MD  albuterol  (VENTOLIN  HFA) 108 [phone] Base) MCG/ACT inhaler Inhale 2 puffs into the lungs every 6 (six) hours as needed for wheezing or shortness of breath. 08/14/22   Richad Jon HERO, NP  busPIRone (BUSPAR) 30 MG tablet  Take 30 mg by mouth 2 (two) times daily. 09/14/22   [provider]  clonazePAM (KLONOPIN) 0.5 MG tablet Take 0.5 mg by mouth at bedtime.     [provider]  cyclobenzaprine  (FLEXERIL ) 10 MG tablet Take 0.5-1 tablets (5-10 mg total) by mouth 3 (three) times daily as needed for muscle spasms (headache). Caution of sedation 08/15/24   Tower, Laine A, MD  escitalopram (LEXAPRO) 20 MG tablet Take 30 mg by mouth daily. 03/07/23   Vincente Grip, MD  Esketamine HCl, 84 MG Dose, (SPRAVATO, 84 MG DOSE,) 28 MG/DEVICE SOPK Place 84 mg into the nose. Every 2-3 weeks as needed 03/15/24   [provider]  fluticasone  (FLONASE ) 50 MCG/ACT nasal spray SPRAY 2 SPRAYS INTO  EACH NOSTRIL EVERY DAY 07/10/24   Tower, Laine LABOR, MD  hyoscyamine  (LEVSIN  SL) 0.125 MG SL tablet Place 1 tablet (0.125 mg total) under the tongue every 6 (six) hours as needed for cramping. 01/14/24   Tower, Laine LABOR, MD  levothyroxine  (SYNTHROID ) 25 MCG tablet TAKE 1 TABLET BY MOUTH EVERY DAY 09/21/24   Tower, Laine LABOR, MD  LORazepam (ATIVAN) 0.5 MG tablet Take 0.5 mg by mouth every 6 (six) hours as needed.    [provider]  megestrol  (MEGACE ) 40 MG tablet TAKE 1 TABLET (40 MG) BY MOUTH DAILY. CAN INCREASE TO 2 TABLETS A DAY IN THE EVENT OF HEAVY BLEEDING 12/01/22   Anyanwu, Ugonna A, MD  Multiple Vitamins-Minerals (CULTURELLE PROBIOTICS + MULTIV) CHEW take 09/09/22   Walsh, Tanya, MD  norethindrone -ethinyl estradiol  (BALZIVA ) 0.4-35 MG-MCG tablet TAKE 1 TABLET BY MOUTH DAILY. TAKE CONTINUOUSLY 07/27/24   Tower, Laine LABOR, MD  ondansetron  (ZOFRAN ) 8 MG tablet Take 1 tablet (8 mg total) by mouth every 8 (eight) hours as needed for nausea or vomiting (if phenergan  does not work). 09/11/22   Tower, Laine LABOR, MD  promethazine  (PHENERGAN ) 25 MG tablet Take 1 tablet (25 mg total) by mouth every 8 (eight) hours as needed for nausea or vomiting (if zofran  does not work). 09/11/22   Tower, Laine LABOR, MD  Suvorexant (BELSOMRA) 5 MG TABS Take by mouth at bedtime.    [provider]  triamcinolone  cream (KENALOG ) 0.1 % Apply 1 Application topically 2 (two) times daily as needed. To affected area 08/21/24   Tower, Laine LABOR, MD  valACYclovir  (VALTREX ) 1000 MG tablet Take 2 pills by mouth twice daily for 1 day for cold sore 07/05/23   Tower, Laine LABOR, MD    Family History Family History  Problem Relation Age of Onset   Other Mother        Digestive problems-w/u @ [facility]-? bact inf   Irritable bowel syndrome Mother    ADD / ADHD Mother    Anxiety disorder Mother    Arthritis Mother    Depression Mother    Hypertension Mother    Obesity Mother    Hearing loss Mother    Hypertension Father         Not in contact with   Allergies Father    Heart disease Father    Alcohol abuse Father    Depression Father    Drug abuse Father    ADD / ADHD Maternal Aunt    Anxiety disorder Maternal Aunt    Learning disabilities Maternal Uncle    Arthritis Maternal Grandmother    Diabetes Maternal Grandfather    Cancer Paternal Grandfather        unsure type   Anxiety disorder  Other        diffusely in family   Depression Other        diffusely in family   Cancer - Colon Neg Hx    Liver disease Neg Hx    Esophageal cancer Neg Hx    Breast cancer Neg Hx     Social History Social History[1]   Allergies   Prevnar 20 [pneumococcal 20-val conj vacc]   Review of Systems Review of Systems  Constitutional:  Positive for fatigue. Negative for activity change, appetite change, chills, diaphoresis, fever and unexpected weight change.  HENT:  Positive for congestion, postnasal drip and sore throat. Negative for dental problem, drooling, ear discharge, ear pain, facial swelling, hearing loss, mouth sores, nosebleeds, rhinorrhea, sinus pressure, sinus pain, sneezing, tinnitus, trouble swallowing and voice change.   Respiratory:  Positive for cough. Negative for apnea, choking, chest tightness, shortness of breath, wheezing and stridor.   Cardiovascular: Negative.   Gastrointestinal: Negative.      Physical Exam Triage Vital Signs ED Triage Vitals  Encounter Vitals Group     BP 09/24/24 1129 118/78     Girls Systolic BP Percentile --      Girls Diastolic BP Percentile --      Boys Systolic BP Percentile --      Boys Diastolic BP Percentile --      Pulse Rate 09/24/24 1129 93     Resp 09/24/24 1129 18     Temp 09/24/24 1129 98.9 F (37.2 C)     Temp Source 09/24/24 1129 Oral     SpO2 09/24/24 1129 96 %     Weight --      Height --      Head Circumference --      Peak Flow --      Pain Score 09/24/24 1134 9     Pain Loc --      Pain Education --      Exclude from Growth Chart --     No data found.  Updated Vital Signs BP 118/78 (BP Location: Right Arm)   Pulse 93   Temp 98.9 F (37.2 C) (Oral)   Resp 18   SpO2 96%   Visual Acuity Right Eye Distance:   Left Eye Distance:   Bilateral Distance:    Right Eye Near:   Left Eye Near:    Bilateral Near:     Physical Exam Constitutional:      Appearance: Normal appearance.  HENT:     Right Ear: Tympanic membrane, ear canal and external ear normal.     Left Ear: Tympanic membrane, ear canal and external ear normal.     Nose: Congestion present.     Mouth/Throat:     Pharynx: No oropharyngeal exudate or posterior oropharyngeal erythema.  Cardiovascular:     Rate and Rhythm: Normal rate and regular rhythm.     Pulses: Normal pulses.     Heart sounds: Normal heart sounds.  Pulmonary:     Effort: Pulmonary effort is normal.     Breath sounds: Normal breath sounds.  Lymphadenopathy:     Cervical: Cervical adenopathy present.  Neurological:     Mental Status: She is alert and oriented to person, place, and time. Mental status is at baseline.      UC Treatments / Results  Labs (all labs ordered are listed, but only abnormal results are displayed) Labs Reviewed - No data to display  EKG   Radiology No results found.  Procedures  Procedures (including critical care time)  Medications Ordered in UC Medications - No data to display  Initial Impression / Assessment and Plan / UC Course  I have reviewed the triage vital signs and the nursing notes.  Pertinent labs & imaging results that were available during my care of the patient were reviewed by me and considered in my medical decision making (see chart for details).  Strep pharyngitis  Rapid testing positive, discussed with patient, cefdinir  prescribed, history of IBS with concern of formation of C. difficile, recommended use of probiotics with treatment, may attempt salt water gargles, throat lozenges, warm to cool liquids per preference,  over-the-counter Chloraseptic spray and over-the-counter analgesics for management of discomfort, may follow-up with urgent care as needed if symptoms persist or worsen, note given  Final Clinical Impressions(s) / UC Diagnoses   Final diagnoses:  None   Discharge Instructions   None    ED Prescriptions   None    PDMP not reviewed this encounter.     [1]  Social History Tobacco Use   Smoking status: Never   Smokeless tobacco: Never  Vaping Use   Vaping status: Never Used  Substance Use Topics   Alcohol use: Not Currently    Comment: Very rarely   Drug use: No     Teresa Shelba SAUNDERS, NP 09/24/24 1229  "

## 2024-09-24 NOTE — ED Triage Notes (Signed)
 Patient complains of sore throat, and nasal congestion x 2 weeks. Patient did home Covid/Flu on 09/10/24 and was positive for Covid. Patient reports that sore throat got worse yesterday. Patient has been taking Tylenol, Ibuprofen and nasal flushes. Rates sore throat pain 9/10.

## 2024-09-25 ENCOUNTER — Ambulatory Visit

## 2024-10-04 ENCOUNTER — Encounter: Payer: Self-pay | Admitting: Family Medicine

## 2024-10-11 ENCOUNTER — Encounter: Payer: Self-pay | Admitting: Family Medicine

## 2024-10-11 DIAGNOSIS — Z0184 Encounter for antibody response examination: Secondary | ICD-10-CM

## 2024-10-12 DIAGNOSIS — Z0184 Encounter for antibody response examination: Secondary | ICD-10-CM | POA: Insufficient documentation

## 2024-10-14 ENCOUNTER — Other Ambulatory Visit: Payer: Self-pay | Admitting: Family Medicine

## 2024-10-23 ENCOUNTER — Other Ambulatory Visit (INDEPENDENT_AMBULATORY_CARE_PROVIDER_SITE_OTHER)

## 2024-10-23 DIAGNOSIS — Z0184 Encounter for antibody response examination: Secondary | ICD-10-CM | POA: Diagnosis not present

## 2024-10-24 ENCOUNTER — Ambulatory Visit: Payer: Self-pay | Admitting: Family Medicine

## 2024-10-24 LAB — MEASLES/MUMPS/RUBELLA IMMUNITY
Mumps IgG: 214 [AU]/ml
Rubella: 9.11 {index}
Rubeola IgG: 144 [AU]/ml

## 2025-08-21 ENCOUNTER — Encounter: Admitting: Family Medicine
